# Patient Record
Sex: Male | Born: 1935 | Race: White | Hispanic: No | Smoking: Former smoker
Health system: Southern US, Community
[De-identification: ages and names within clinical notes are randomized; demographics above are authoritative.]

## PROBLEM LIST (undated history)

## (undated) DIAGNOSIS — M1990 Unspecified osteoarthritis, unspecified site: Secondary | ICD-10-CM

## (undated) DIAGNOSIS — I4891 Unspecified atrial fibrillation: Secondary | ICD-10-CM

## (undated) DIAGNOSIS — I272 Pulmonary hypertension, unspecified: Secondary | ICD-10-CM

## (undated) DIAGNOSIS — I10 Essential (primary) hypertension: Secondary | ICD-10-CM

## (undated) DIAGNOSIS — D696 Thrombocytopenia, unspecified: Secondary | ICD-10-CM

## (undated) DIAGNOSIS — N289 Disorder of kidney and ureter, unspecified: Secondary | ICD-10-CM

## (undated) DIAGNOSIS — L989 Disorder of the skin and subcutaneous tissue, unspecified: Secondary | ICD-10-CM

## (undated) DIAGNOSIS — K5792 Diverticulitis of intestine, part unspecified, without perforation or abscess without bleeding: Secondary | ICD-10-CM

## (undated) DIAGNOSIS — D649 Anemia, unspecified: Secondary | ICD-10-CM

## (undated) DIAGNOSIS — Z8719 Personal history of other diseases of the digestive system: Secondary | ICD-10-CM

## (undated) HISTORY — DX: Personal history of other diseases of the digestive system: Z87.19

## (undated) HISTORY — DX: Essential (primary) hypertension: I10

## (undated) HISTORY — DX: Diverticulitis of intestine, part unspecified, without perforation or abscess without bleeding: K57.92

## (undated) HISTORY — DX: Disorder of the skin and subcutaneous tissue, unspecified: L98.9

## (undated) HISTORY — DX: Pulmonary hypertension, unspecified: I27.20

## (undated) HISTORY — DX: Disorder of kidney and ureter, unspecified: N28.9

## (undated) HISTORY — DX: Thrombocytopenia, unspecified: D69.6

## (undated) HISTORY — DX: Anemia, unspecified: D64.9

## (undated) HISTORY — DX: Unspecified atrial fibrillation: I48.91

## (undated) HISTORY — PX: CATARACT EXTRACTION: SUR2

## (undated) HISTORY — PX: TONSILLECTOMY: SUR1361

## (undated) HISTORY — DX: Unspecified osteoarthritis, unspecified site: M19.90

---

## 1978-04-10 HISTORY — PX: OTHER SURGICAL HISTORY: SHX169

## 2000-04-10 HISTORY — PX: AV FISTULA PLACEMENT: SHX1204

## 2007-11-08 ENCOUNTER — Encounter: Payer: Self-pay | Admitting: Internal Medicine

## 2009-06-04 ENCOUNTER — Encounter: Payer: Self-pay | Admitting: Internal Medicine

## 2009-07-06 ENCOUNTER — Encounter: Payer: Self-pay | Admitting: Emergency Medicine

## 2010-02-21 ENCOUNTER — Ambulatory Visit: Payer: Self-pay | Admitting: Internal Medicine

## 2010-02-21 DIAGNOSIS — I959 Hypotension, unspecified: Secondary | ICD-10-CM

## 2010-02-21 DIAGNOSIS — I679 Cerebrovascular disease, unspecified: Secondary | ICD-10-CM | POA: Insufficient documentation

## 2010-02-21 DIAGNOSIS — B0229 Other postherpetic nervous system involvement: Secondary | ICD-10-CM

## 2010-02-21 DIAGNOSIS — D473 Essential (hemorrhagic) thrombocythemia: Secondary | ICD-10-CM

## 2010-02-21 DIAGNOSIS — R55 Syncope and collapse: Secondary | ICD-10-CM | POA: Insufficient documentation

## 2010-02-21 DIAGNOSIS — I4891 Unspecified atrial fibrillation: Secondary | ICD-10-CM | POA: Insufficient documentation

## 2010-02-21 DIAGNOSIS — Z8719 Personal history of other diseases of the digestive system: Secondary | ICD-10-CM

## 2010-02-21 DIAGNOSIS — I272 Pulmonary hypertension, unspecified: Secondary | ICD-10-CM

## 2010-02-21 DIAGNOSIS — N19 Unspecified kidney failure: Secondary | ICD-10-CM | POA: Insufficient documentation

## 2010-02-21 HISTORY — DX: Personal history of other diseases of the digestive system: Z87.19

## 2010-02-21 LAB — CONVERTED CEMR LAB
BUN: 86 mg/dL (ref 6–23)
Basophils Absolute: 0.1 10*3/uL (ref 0.0–0.1)
Basophils Relative: 0.5 % (ref 0.0–3.0)
CO2: 19 meq/L (ref 19–32)
Calcium: 8.5 mg/dL (ref 8.4–10.5)
Chloride: 107 meq/L (ref 96–112)
Creatinine, Ser: 3.8 mg/dL — ABNORMAL HIGH (ref 0.4–1.5)
Eosinophils Absolute: 0.1 10*3/uL (ref 0.0–0.7)
Eosinophils Relative: 1.1 % (ref 0.0–5.0)
GFR calc non Af Amer: 16.22 mL/min (ref >60)
Glucose, Bld: 105 mg/dL — ABNORMAL HIGH (ref 70–99)
HCT: 29.4 % — ABNORMAL LOW (ref 39.0–52.0)
Hemoglobin: 10 g/dL — ABNORMAL LOW (ref 13.0–17.0)
Lymphocytes Relative: 9.2 % — ABNORMAL LOW (ref 12.0–46.0)
Lymphs Abs: 1.1 10*3/uL (ref 0.7–4.0)
MCHC: 34.1 g/dL (ref 30.0–36.0)
MCV: 87.9 fL (ref 78.0–100.0)
Monocytes Absolute: 0.9 10*3/uL (ref 0.1–1.0)
Monocytes Relative: 6.9 % (ref 3.0–12.0)
Neutro Abs: 10.3 10*3/uL — ABNORMAL HIGH (ref 1.4–7.7)
Neutrophils Relative %: 82.3 % — ABNORMAL HIGH (ref 43.0–77.0)
Platelets: 447 10*3/uL — ABNORMAL HIGH (ref 150.0–400.0)
Potassium: 5 meq/L (ref 3.5–5.1)
RBC: 3.34 M/uL — ABNORMAL LOW (ref 4.22–5.81)
RDW: 15.5 % — ABNORMAL HIGH (ref 11.5–14.6)
Sodium: 137 meq/L (ref 135–145)
TSH: 3.26 microintl units/mL (ref 0.35–5.50)
WBC: 12.5 10*3/uL — ABNORMAL HIGH (ref 4.5–10.5)

## 2010-02-28 ENCOUNTER — Ambulatory Visit: Payer: Self-pay | Admitting: Internal Medicine

## 2010-03-01 ENCOUNTER — Telehealth: Payer: Self-pay | Admitting: Internal Medicine

## 2010-03-01 ENCOUNTER — Encounter: Payer: Self-pay | Admitting: Internal Medicine

## 2010-03-02 ENCOUNTER — Encounter: Payer: Self-pay | Admitting: Internal Medicine

## 2010-03-07 DIAGNOSIS — N186 End stage renal disease: Secondary | ICD-10-CM

## 2010-03-08 ENCOUNTER — Encounter: Payer: Self-pay | Admitting: Internal Medicine

## 2010-03-08 ENCOUNTER — Telehealth: Payer: Self-pay | Admitting: Internal Medicine

## 2010-03-08 LAB — COMPREHENSIVE METABOLIC PANEL
Alkaline Phosphatase: 100 U/L (ref 39–117)
BUN: 67 mg/dL — ABNORMAL HIGH (ref 6–23)
CO2: 20 mEq/L (ref 19–32)
Creatinine, Ser: 2.7 mg/dL — ABNORMAL HIGH (ref 0.40–1.50)
Glucose, Bld: 134 mg/dL — ABNORMAL HIGH (ref 70–99)
Total Bilirubin: 0.3 mg/dL (ref 0.3–1.2)

## 2010-03-08 LAB — CBC WITH DIFFERENTIAL/PLATELET
Basophils Absolute: 0.1 10*3/uL (ref 0.0–0.1)
Eosinophils Absolute: 0.2 10*3/uL (ref 0.0–0.5)
HCT: 29.3 % — ABNORMAL LOW (ref 38.4–49.9)
HGB: 9.9 g/dL — ABNORMAL LOW (ref 13.0–17.1)
LYMPH%: 15.9 % (ref 14.0–49.0)
MCV: 88.1 fL (ref 79.3–98.0)
MONO%: 7.2 % (ref 0.0–14.0)
NEUT#: 7.4 10*3/uL — ABNORMAL HIGH (ref 1.5–6.5)
NEUT%: 74.1 % (ref 39.0–75.0)
Platelets: 689 10*3/uL — ABNORMAL HIGH (ref 140–400)
RBC: 3.33 10*6/uL — ABNORMAL LOW (ref 4.20–5.82)

## 2010-03-08 LAB — LACTATE DEHYDROGENASE: LDH: 198 U/L (ref 94–250)

## 2010-03-10 LAB — IRON AND TIBC
%SAT: 18 % — ABNORMAL LOW (ref 20–55)
Iron: 43 ug/dL (ref 42–165)
TIBC: 235 ug/dL (ref 215–435)

## 2010-03-10 LAB — PROTEIN ELECTROPHORESIS, SERUM
Alpha-2-Globulin: 10.4 % (ref 7.1–11.8)
Beta Globulin: 6 % (ref 4.7–7.2)
Total Protein, Serum Electrophoresis: 6.9 g/dL (ref 6.0–8.3)

## 2010-03-11 LAB — JAK-2 V617F

## 2010-03-16 ENCOUNTER — Encounter: Payer: Self-pay | Admitting: Internal Medicine

## 2010-03-18 ENCOUNTER — Ambulatory Visit: Payer: Self-pay | Admitting: Emergency Medicine

## 2010-03-22 ENCOUNTER — Ambulatory Visit: Payer: Self-pay | Admitting: Internal Medicine

## 2010-03-22 DIAGNOSIS — M109 Gout, unspecified: Secondary | ICD-10-CM

## 2010-03-22 LAB — CONVERTED CEMR LAB: Uric Acid, Serum: 9.3 mg/dL — ABNORMAL HIGH (ref 4.0–7.8)

## 2010-03-23 ENCOUNTER — Encounter: Payer: Self-pay | Admitting: Internal Medicine

## 2010-03-23 LAB — COMPREHENSIVE METABOLIC PANEL
Alkaline Phosphatase: 88 U/L (ref 39–117)
Creatinine, Ser: 3.67 mg/dL — ABNORMAL HIGH (ref 0.40–1.50)
Glucose, Bld: 112 mg/dL — ABNORMAL HIGH (ref 70–99)
Sodium: 144 mEq/L (ref 135–145)
Total Bilirubin: 0.3 mg/dL (ref 0.3–1.2)
Total Protein: 6.7 g/dL (ref 6.0–8.3)

## 2010-03-23 LAB — CBC WITH DIFFERENTIAL/PLATELET
BASO%: 0.8 % (ref 0.0–2.0)
Eosinophils Absolute: 0.2 10*3/uL (ref 0.0–0.5)
LYMPH%: 15.7 % (ref 14.0–49.0)
MCHC: 33.5 g/dL (ref 32.0–36.0)
MCV: 87.9 fL (ref 79.3–98.0)
MONO%: 6.4 % (ref 0.0–14.0)
NEUT%: 75.3 % — ABNORMAL HIGH (ref 39.0–75.0)
Platelets: 445 10*3/uL — ABNORMAL HIGH (ref 140–400)
RBC: 3.41 10*6/uL — ABNORMAL LOW (ref 4.20–5.82)

## 2010-04-19 ENCOUNTER — Ambulatory Visit: Payer: Self-pay | Admitting: Internal Medicine

## 2010-04-20 ENCOUNTER — Ambulatory Visit
Admission: RE | Admit: 2010-04-20 | Discharge: 2010-04-20 | Payer: Self-pay | Source: Home / Self Care | Attending: Vascular Surgery | Admitting: Vascular Surgery

## 2010-04-20 ENCOUNTER — Ambulatory Visit: Admit: 2010-04-20 | Payer: Self-pay | Admitting: Vascular Surgery

## 2010-04-21 ENCOUNTER — Encounter: Payer: Self-pay | Admitting: Internal Medicine

## 2010-04-21 LAB — CBC WITH DIFFERENTIAL/PLATELET
BASO%: 0.7 % (ref 0.0–2.0)
Basophils Absolute: 0.1 10*3/uL (ref 0.0–0.1)
EOS%: 1.8 % (ref 0.0–7.0)
Eosinophils Absolute: 0.2 10*3/uL (ref 0.0–0.5)
HCT: 31.2 % — ABNORMAL LOW (ref 38.4–49.9)
HGB: 10.4 g/dL — ABNORMAL LOW (ref 13.0–17.1)
LYMPH%: 13.4 % — ABNORMAL LOW (ref 14.0–49.0)
MCH: 29.5 pg (ref 27.2–33.4)
MCHC: 33.5 g/dL (ref 32.0–36.0)
MCV: 88 fL (ref 79.3–98.0)
MONO#: 0.6 10*3/uL (ref 0.1–0.9)
MONO%: 6 % (ref 0.0–14.0)
NEUT#: 8.3 10*3/uL — ABNORMAL HIGH (ref 1.5–6.5)
NEUT%: 78.1 % — ABNORMAL HIGH (ref 39.0–75.0)
Platelets: 331 10*3/uL (ref 140–400)
RBC: 3.54 10*6/uL — ABNORMAL LOW (ref 4.20–5.82)
RDW: 14.9 % — ABNORMAL HIGH (ref 11.0–14.6)
WBC: 10.7 10*3/uL — ABNORMAL HIGH (ref 4.0–10.3)
lymph#: 1.4 10*3/uL (ref 0.9–3.3)

## 2010-04-21 LAB — LACTATE DEHYDROGENASE: LDH: 178 U/L (ref 94–250)

## 2010-04-26 ENCOUNTER — Encounter: Payer: Self-pay | Admitting: Emergency Medicine

## 2010-04-26 ENCOUNTER — Encounter: Payer: Self-pay | Admitting: Internal Medicine

## 2010-05-10 NOTE — Progress Notes (Signed)
Summary: REFILL REQUEST  Phone Note Refill Request Message from:  Patient on March 01, 2010 4:04 PM  Refills Requested: Medication #1:  DOXAZOSIN MESYLATE 1 MG TABS one by mouth q hs   Notes: [facility].   Dosage confirmed as above?Dosage Confirmed    Initial call taken by: Debbra Riding,  March 01, 2010 4:04 PM    Prescriptions: DOXAZOSIN MESYLATE 1 MG TABS (DOXAZOSIN MESYLATE) one by mouth q hs  #30 x 6   Entered by:   Willy Eddy, LPN   Authorized by:   Stacie Glaze MD   Signed by:   Willy Eddy, LPN on 30/16/0109   Method used:   [facility]ronically to        [id number] (retail)       [address] [place], [place]  [address]       Ph: [phone] or [phone]       Fax: [phone]   RxID:   [id number]

## 2010-05-10 NOTE — Letter (Signed)
Summary: Records from [facility] [place] [facility] 2009 - 2011  Records from [facility] 2009 - 2011   Imported By: Maryln Gottron 03/11/2010 09:51:13  _____________________________________________________________________  External Attachment:    Type:   Image     Comment:   External Document

## 2010-05-10 NOTE — Progress Notes (Signed)
Summary: elevated BP  Phone Note Call from Patient Call back at Work Phone [phone]   Caller: Daughter Call For: Stacie Glaze MD Summary of Call: Pt was at the oncologist today and his BP was 241/104 and after Clonidine, it went down to 196/90.  Advised to call Dr. Lovell Sheehan [phone] [facility] Initial call taken by: Lynann Beaver CMA AAMA,  March 08, 2010 3:21 PM  Follow-up for Phone Call        increased the hydralyzine to 50 three times a day--talked with daughter and nephrologist is going to to monitor a nd give bp meds- they increased carvedilol to 6.25 -2 tabs two times a day.  t Follow-up by: Stacie Glaze MD,  March 08, 2010 4:56 PM    New/Updated Medications: HYDRALAZINE HCL 50 MG TABS (HYDRALAZINE HCL) one by mouth TID HYDRALAZINE HCL 25 MG TABS (HYDRALAZINE HCL) 1 three times a day CARVEDILOL 6.25 MG TABS (CARVEDILOL) 2 two times a day

## 2010-05-10 NOTE — Assessment & Plan Note (Signed)
Summary: PAH   Visit Type:  Initial Consult Copy to:  Eliott Nine Primary Provider/Referring Provider:  Darryll Capers  CC:  Pulmonary hypertension consult.  History of Present Illness: 77 yo man, hx cardiomyopathy (believed non-ischemic), A Fib, thrombocytopenia (Mohamed), renal insuffiency Eliott Nine). Also with PAH likely due to his L sided disease, has been on sildenifil x 2 years (started by cardiology in [place]). No longer on coumadin. No hx known lung disease, although he did formerly smoke (10pk-yrs). Getting his Revatio direct from [employer] with assistance program.   Preventive Screening-Counseling & Management  Alcohol-Tobacco     Smoking Status: quit     Packs/Day: 0.5     Year Started: 1942     Year Quit: 1981     Pack years: 20  Current Medications (verified): 1)  Hydralazine Hcl 25 Mg Tabs (Hydralazine Hcl) .Marland Kitchen.. 1 Three Times A Day 2)  Revatio 20 Mg Tabs (Sildenafil Citrate) .... One By Mouth Tid 3)  Carvedilol 6.25 Mg Tabs (Carvedilol) .... 2 Two Times A Day 4)  Folic Acid 1 Mg Tabs (Folic Acid) .... As Directed 5)  Vitamin C 100 Mg Tabs (Ascorbic Acid) .... As Directed 6)  Nexium 40 Mg Cpdr (Esomeprazole Magnesium) .... One By Mouth Daily 7)  Aspir-Low 81 Mg Tbec (Aspirin) .... One By Mouth Daily 8)  Anagrelide Hcl 0.5 Mg Caps (Anagrelide Hcl) .... 5 Caps By Mouth Daily 9)  Lasix 40 Mg Tabs (Furosemide) .... One By Mouth Daily 10)  Doxazosin Mesylate 2 Mg Tabs (Doxazosin Mesylate) .Marland Kitchen.. 1 By Mouth Daily 11)  Tramadol Hcl 50 Mg Tabs (Tramadol Hcl) .Marland Kitchen.. 1-2 By Mouth Every 4-6 Hours As Needed Pain 12)  Vitamin D3 50000 Unit Caps (Cholecalciferol) .Marland Kitchen.. 1 By Mouth Twice Weekly 13)  Uloric 40 Mg Tabs (Febuxostat) .Marland Kitchen.. 1 By Mouth Daily 14)  Integra Plus  Caps (Fefum-Fepoly-Fa-B Cmp-C-Biot) .Marland Kitchen.. 1 By Mouth Daily  Allergies (verified): 1)  ! * Shellfish  Past History:  Family History: Last updated: 03/18/2010 Family History Hypertension Mother with hx of pacer Family History  MI/Heart Attack---father, brother  Risk Factors: Exercise: no (02/21/2010)  Past Medical History: Pulmonary hypetension Atrial fibrillation Thrombocytopenia; hx BM Bx in [place] Anemia Arthritis Chickenpox Diverticulitis; c/b perf and colectomy Hypertension Renal Insufficiency S Cr 2.8 - 3.6  Past Surgical History: Reviewed history from 02/21/2010 and no changes required. Ruptured colon s/p colectomy Tonsilectomy/Adenoids  Family History: Family History Hypertension Mother with hx of pacer Family History MI/Heart Attack---father, brother  Social History: Retired Scientist, physiological Alcohol use-yes Former Smoker Moved to [place] from [place] 11/2009 Packs/Day:  0.5 Pack years:  20  Review of Systems       Able to do most activities, does have some SOB with inclines. No syncope, no CP, no pre-syncope.   Vital Signs:  Patient profile:   77 year old male Height:      61.5 inches (156.21 cm) Weight:      140 pounds (63.64 kg) BMI:     26.12 O2 Sat:      96 % on Room air Temp:     98.1 degrees F (36.72 degrees C) oral Pulse rate:   80 / minute BP sitting:   160 / 82  (left arm) Cuff size:   regular  Vitals Entered By: Michel Bickers CMA (March 18, 2010 2:40 PM)  O2 Sat at Rest %:  96 O2 Flow:  Room air  Serial Vital Signs/Assessments:  Comments: 4:05 PM Ambulatory Pulse Oximetry  Resting; HR__77___  02 Sat__98% on room air___  Lap1 (185 feet)   HR_92____   02 Sat_98% on room air____ Lap2 (185 feet)   HR_96____   02 Sat__100% on room air___    Lap3 (185 feet)   HR__101___   02 Sat__98% on room air___  _x__Test Completed without Difficulty ___Test Stopped due to:  By: Michel Bickers CMA   CC: Pulmonary hypertension consult Is Patient Diabetic? No Comments Medications reviewed with patient Michel Bickers [facility]  March 18, 2010 2:41 PM   Physical Exam  General:  pleasant elderly man, normal appearance and healthy appearing.   Head:  normocephalic and  atraumatic.   Eyes:  pupils equal and pupils round.   Mouth:  no deformity or lesions Neck:  supple and full ROM.   Chest Wall:  no deformities and no tenderness.   Lungs:  normal respiratory effort and no dullness.   Heart:  regular, sounds like NSR today Abdomen:  not examined Msk:  no joint warmth and no redness over joints.   Extremities:  trace left pedal edema and trace right pedal edema.   Neurologic:  alert & oriented X3 and gait normal.   Skin:  intact without lesions or rashes Psych:  alert and cooperative; normal mood and affect; normal attention span and concentration   Impression & Recommendations:  Problem # 1:  PULMONARY HYPERTENSION (ICD-416.8)  Likely secondary to L sided disease. Will screen for walking hypoxemia, interstitial disease (doubt based on exam) - walking oximetry, no desat - CXR now - continue Revatio and contact [employer] to indicate that we are the ordering physicians.  - off coumadin  Problem # 2:  ATRIAL FIBRILLATION (ICD-427.31)  His updated medication list for this problem includes:    Carvedilol 6.25 Mg Tabs (Carvedilol) .Marland Kitchen... 2 two times a day    Aspir-low 81 Mg Tbec (Aspirin) ..... 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 chemo or immunomod drugs - story unclear to me at this time but sounds like he was left w thrombocytopenia (as well as non-ischemic CM?) - will need to get more records from [place] to clarify - he is being evaluated by Dr Arbutus Ped locally  Problem # 4:  RENAL FAILURE (ICD-586) - Dr Eliott Nine  Problem # 5:  HYPERTENSION (ICD-401.9)  His updated medication list for this problem includes:    Hydralazine Hcl 25 Mg Tabs (Hydralazine hcl) .Marland Kitchen... 1 three times a day    Carvedilol 6.25 Mg Tabs (Carvedilol) .Marland Kitchen... 2 two times a day    Lasix 40 Mg Tabs (Furosemide)  ..... One by mouth daily    Doxazosin Mesylate 2 Mg Tabs (Doxazosin mesylate) .Marland Kitchen... 1 by mouth daily  Medications Added to Medication List This Visit: 1)  Doxazosin Mesylate 2 Mg Tabs (Doxazosin mesylate) .Marland Kitchen.. 1 by mouth daily 2)  Vitamin D3 50000 Unit Caps (Cholecalciferol) .Marland Kitchen.. 1 by mouth twice weekly 3)  Uloric 40 Mg Tabs (Febuxostat) .Marland Kitchen.. 1 by mouth daily 4)  Integra Plus Caps (Fefum-fepoly-fa-b cmp-c-biot) .Marland Kitchen.. 1 by mouth daily  Other Orders: Consultation Level IV (16109) Misc. Referral (Misc. Ref) T-2 View CXR (71020TC)  Patient Instructions: 1)  CXR today 2)  We will continue your Revatio 20mg  three times a day  3)  Walking oximetry today was normal 4)  Follow up with  Dr Delton Coombes in 6 months or as needed

## 2010-05-10 NOTE — Consult Note (Signed)
Summary: [facility]   Imported By: Maryln Gottron 03/16/2010 09:19:49  _____________________________________________________________________  External Attachment:    Type:   Image     Comment:   External Document

## 2010-05-10 NOTE — Assessment & Plan Note (Signed)
Summary: BRAND NEW PT/TO EST/PER DR JENKINS/CJR   Vital Signs:  Patient profile:   77 year old male Height:      61.5 inches Weight:      142 pounds BMI:     26.49 Temp:     98.6 degrees F oral Pulse rate:   82 / minute Pulse rhythm:   regular Resp:     16 per minute BP sitting:   148 / 66  Vitals Entered By: Lynann Beaver CMA AAMA (February 21, 2010 2:52 PM) CC: To be established, Hypertension Management Is Patient Diabetic? No Pain Assessment Patient in pain? no        Visit Type:  to establish Primary Care Provider:  Abram Sander Arellano  CC:  To be established and Hypertension Management.  History of Present Illness: History of bleeding  while on Coumadin Records are coming from other doctors office He was on coumadin for AF by Dr  in [facility] due to bleeding gums He has had AF 8 years? He has a hx of reactive?  thrombocythemia.... and is on medication for this... has seen a hematologist for this.. He has a hx of pulmonary HTN on Revatio He had a cardiologist fro the AF had post herpetic neuralgia and has severe pain failed lyrica and neurontin pain is on the right side, worsened with arm movement Hx of renal failure probable near to dialysis  Hypertension History:      He complains of dyspnea with exertion and peripheral edema, but denies headache, chest pain, palpitations, orthopnea, PND, visual symptoms, neurologic problems, syncope, and side effects from treatment.        Positive major cardiovascular risk factors include male age 57 years old or older and hypertension.  Negative major cardiovascular risk factors include non-tobacco-user status.     Preventive Screening-Counseling & Management  Alcohol-Tobacco     Smoking Status: quit     Year Quit: 1982     Tobacco Counseling: to remain off tobacco products  Caffeine-Diet-Exercise     Does Patient Exercise: no  Problems Prior to Update: 1)  Postherpetic Neuralgia  (ICD-053.19) 2)  Thrombocythemia   (ICD-238.71) 3)  Pulmonary Hypertension  (ICD-416.8) 4)  Cerebrovascular Disease  (ICD-437.9) 5)  Syncope  (ICD-780.2) 6)  Hypertension  (ICD-401.9) 7)  Diverticulitis, Hx of  (ICD-V12.79) 8)  Atrial Fibrillation  (ICD-427.31)  Current Medications (verified): 1)  Hydralazine Hcl 25 Mg Tabs (Hydralazine Hcl) .... One By Mouth Tid 2)  Revatio 20 Mg Tabs (Sildenafil Citrate) .... One By Mouth Tid 3)  Carvedilol 6.25 Mg Tabs (Carvedilol) .... One By Mouth Bid 4)  Folic Acid 1 Mg Tabs (Folic Acid) .... As Directed 5)  Iron 325 (65 Fe) Mg Tabs (Ferrous Sulfate) .... As Directed 6)  Vitamin C 100 Mg Tabs (Ascorbic Acid) .... As Directed 7)  Nexium 40 Mg Cpdr (Esomeprazole Magnesium) .... One By Mouth Daily 8)  Aspir-Low 81 Mg Tbec (Aspirin) .... One By Mouth Daily 9)  Anagrelide Hcl 0.5 Mg Caps (Anagrelide Hcl) .... 5 Caps By Mouth Daily 10)  Lasix 40 Mg Tabs (Furosemide) .... One By Mouth Daily 11)  Probenecid 500 Mg Tabs (Probenecid) .... 2 By Mouth Q Day 12)  Doxazosin Mesylate 1 Mg Tabs (Doxazosin Mesylate) .... One By Mouth Q Hs 13)  Tramadol Hcl 50 Mg Tabs (Tramadol Hcl) .Marland Kitchen.. 1-2 By Mouth Every 4-6 Hours As Needed Pain  Allergies (verified): 1)  ! * Shellfish  Past History:  Family History: Last updated: 02/21/2010 Family  History Hypertension Mother with hx of pacer  Social History: Last updated: 02/21/2010 Retired Widow/Widower Alcohol use-yes Former Smoker  Risk Factors: Exercise: no (02/21/2010)  Risk Factors: Smoking Status: quit (02/21/2010)  Past medical, surgical, family and social histories (including risk factors) reviewed, and no changes noted (except as noted below).  Past Medical History: Pulmonary hypetension Atrial fibrillation Platelet disorder Anemia Arthritis Chickenpox Diverticulitis, hx of Heart Arrhythmia Hypertension  Past Surgical History: Ruptured colon s/p colectomy Tonsilectomy/Adenoids  Family History: Reviewed history and no  changes required. Family History Hypertension Mother with hx of pacer  Social History: Reviewed history and no changes required. Retired Widow/Widower Alcohol use-yes Former Smoker Smoking Status:  quit Does Patient Exercise:  no  Review of Systems       The patient complains of decreased hearing and dyspnea on exertion.  The patient denies anorexia, fever, weight loss, weight gain, vision loss, hoarseness, chest pain, syncope, peripheral edema, prolonged cough, headaches, hemoptysis, abdominal pain, melena, hematochezia, severe indigestion/heartburn, hematuria, incontinence, genital sores, muscle weakness, suspicious skin lesions, transient blindness, difficulty walking, depression, unusual weight change, abnormal bleeding, enlarged lymph nodes, angioedema, breast masses, and testicular masses.    Physical Exam  General:  alert and well-hydrated.   Head:  normocephalic and atraumatic.   Eyes:  pupils equal and pupils round.   Ears:  R ear normal and L ear normal.   Neck:  supple and full ROM.   Chest Wall:  no deformities and no tenderness.   Lungs:  normal respiratory effort and no dullness.   Heart:  irregular rhythm.   Abdomen:  soft and non-tender.   Msk:  no joint warmth and no redness over joints.   Extremities:  trace left pedal edema and trace right pedal edema.   Neurologic:  alert & oriented X3 and gait normal.   Skin:  turgor normal and no rashes.   Psych:  Oriented X3 and not anxious appearing.     Impression & Recommendations:  Problem # 1:  POSTHERPETIC NEURALGIA (ICD-053.19) consider referral for pain block... 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 on medications records from [facility] pending moniter CBC today Orders: Venipuncture (02725) Specimen Handling (36644) Hematology Referral (Hematology) TLB-CBC Platelet - w/Differential  (85025-CBCD)  Problem # 3:  PULMONARY HYPERTENSION (ICD-416.8) do not have echo... on revatio.... he think he had an echo early this year will request ASAP Orders: Pulmonary Referral (Pulmonary)  Problem # 4:  HYPERTENSION (ICD-401.9)  stable His updated medication list for this problem includes:    Hydralazine Hcl 25 Mg Tabs (Hydralazine hcl) ..... One by mouth tid    Carvedilol 6.25 Mg Tabs (Carvedilol) ..... One by mouth bid    Lasix 40 Mg Tabs (Furosemide) ..... One by mouth daily    Doxazosin Mesylate 1 Mg Tabs (Doxazosin mesylate) ..... One by mouth q hs  BP today: 148/66  10 Yr Risk Heart Disease: Not enough information  Orders: Specimen Handling (03474) TLB-BMP (Basic Metabolic Panel-BMET) (80048-METABOL)  Problem # 5:  ATRIAL FIBRILLATION (ICD-427.31)  moniter rate.... rate controlled today cannot tolerate coumadin no valve dz has not been on pradax obtain records and consider pradaxa  His updated medication list for this problem includes:    Carvedilol 6.25 Mg Tabs (Carvedilol) ..... One by mouth bid    Aspir-low 81 Mg Tbec (Aspirin) ..... One by mouth daily    Anagrelide Hcl 0.5  Mg Caps (Anagrelide hcl) .Marland KitchenMarland KitchenMarland KitchenMarland Kitchen 5 caps by mouth daily  Orders: Specimen Handling (16109) TLB-TSH (Thyroid Stimulating Hormone) (84443-TSH)  Problem # 6:  END STAGE RENAL DISEASE (ICD-585.6) referral to renal   Complete Medication List: 1)  Hydralazine Hcl 25 Mg Tabs (Hydralazine hcl) .... One by mouth tid 2)  Revatio 20 Mg Tabs (Sildenafil citrate) .... One by mouth tid 3)  Carvedilol 6.25 Mg Tabs (Carvedilol) .... One by mouth bid 4)  Folic Acid 1 Mg Tabs (Folic acid) .... As directed 5)  Iron 325 (65 Fe) Mg Tabs (Ferrous sulfate) .... As directed 6)  Vitamin C 100 Mg Tabs (Ascorbic acid) .... As directed 7)  Nexium 40 Mg Cpdr (Esomeprazole magnesium) .... One by mouth daily 8)  Aspir-low 81 Mg Tbec (Aspirin) .... One by mouth daily 9)  Anagrelide Hcl 0.5 Mg Caps (Anagrelide  hcl) .... 5 caps by mouth daily 10)  Lasix 40 Mg Tabs (Furosemide) .... One by mouth daily 11)  Probenecid 500 Mg Tabs (Probenecid) .... 2 by mouth q day 12)  Doxazosin Mesylate 1 Mg Tabs (Doxazosin mesylate) .... One by mouth q hs 13)  Tramadol Hcl 50 Mg Tabs (Tramadol hcl) .Marland Kitchen.. 1-2 by mouth every 4-6 hours as needed pain  Other Orders: Nephrology Referral (Nephro)  Hypertension Assessment/Plan:      The patient's hypertensive risk group is category B: At least one risk factor (excluding diabetes) with no target organ damage.  Today's blood pressure is 148/66.  His blood pressure goal is < 140/90.   Patient Instructions: 1)  Please schedule a follow-up appointment in 1 months. Prescriptions: TRAMADOL HCL 50 MG TABS (TRAMADOL HCL) 1-2 by mouth every 4-6 hours as needed pain  #100 x 2   Entered and Authorized by:   Stacie Glaze MD   Signed by:   Stacie Glaze MD on 02/21/2010   Method used:   [id number]ail[facility]       [address] [place], [place]  [address]       Ph: [phone] or [phone]       Fax: [phone]   RxID:   [id number]    Orders Added: 1)  New Patient Level III [03474] 2)  Venipuncture [25956] 3)  Specimen Handling [99000] 4)  Pulmonary Referral [Pulmonary] 5)  Hematology Referral [Hematology] 6)  Nephrology Referral [Nephro] 7)  TLB-CBC Platelet - w/Differential [85025-CBCD] 8)  TLB-BMP (Basic Metabolic Panel-BMET) [80048-METABOL] 9)  TLB-TSH (Thyroid Stimulating Hormone) [38756-EPP]

## 2010-05-10 NOTE — Progress Notes (Signed)
Summary: bonnie please call  Phone Note Call from Patient Call back at [phone]   Caller: Patient Call For: Stacie Glaze MD Summary of Call: Pt daugher leslie has ?concerning dad seeing oncologist for his platelets. Initial call taken by: Heron Sabins,  March 01, 2010 11:26 AM  Follow-up for Phone Call        daughter is aware referral will take a few wks Follow-up by: Heron Sabins,  March 01, 2010 11:29 AM  Additional Follow-up for Phone Call Additional follow up Details #1::        terri please follow up on status of referral Additional Follow-up by: Heron Sabins,  March 01, 2010 11:32 AM    Additional Follow-up for Phone Call Additional follow up Details #2::    Pt scheduled - [facility] is calling pt now to make aware. Follow-up by: Corky Mull,  March 02, 2010 2:51 PM

## 2010-05-12 ENCOUNTER — Ambulatory Visit (HOSPITAL_COMMUNITY): Payer: MEDICARE | Attending: Nephrology

## 2010-05-12 DIAGNOSIS — N189 Chronic kidney disease, unspecified: Secondary | ICD-10-CM | POA: Insufficient documentation

## 2010-05-12 DIAGNOSIS — N039 Chronic nephritic syndrome with unspecified morphologic changes: Secondary | ICD-10-CM | POA: Insufficient documentation

## 2010-05-12 DIAGNOSIS — D631 Anemia in chronic kidney disease: Secondary | ICD-10-CM | POA: Insufficient documentation

## 2010-05-12 NOTE — Letter (Addendum)
Summary: [facility]  [facility]   Imported By: Maryln Gottron 04/07/2010 10:01:44  _____________________________________________________________________  External Attachment:    Type:   Image     Comment:   External Document

## 2010-05-12 NOTE — Assessment & Plan Note (Signed)
Summary: 1 month fup//ccm---PT [facility] // RS   Vital Signs:  Patient profile:   77 year old male Height:      61.5 inches Weight:      140 pounds BMI:     26.12 Temp:     98.2 degrees F oral Pulse rate:   60 / minute Resp:     14 per minute BP sitting:   144 / 78  (left arm)  Vitals Entered By: Willy Eddy, LPN (March 22, 2010 11:11 AM) CC: roa Is Patient Diabetic? No   Primary Care Provider:  Darryll Capers  CC:  roa.  History of Present Illness: follow up for pt who has seen multiple new doctors for his problems including renal, pulmonary, and cardiology  reviewed labs and discussion of dialysis potential He looks remarkable weill given his problem list. We ahd a log discussion wwith his reguarding the placement of his shunt for possible dialysis and  his understanding for this consultatiosn for oulmonary and renal. I have spent greater that 45 min face to face evaluating this patient and over 1/2 of this time was in councilling   Preventive Screening-Counseling & Management  Alcohol-Tobacco     Smoking Status: quit     Packs/Day: 0.5     Year Started: 1942     Year Quit: 1981     Pack years: 20     Tobacco Counseling: to remain off tobacco products  Problems Prior to Update: 1)  Hyperuricemia  (ICD-790.6) 2)  End Stage Renal Disease  (ICD-585.6) 3)  Renal Failure  (ICD-586) 4)  Postherpetic Neuralgia  (ICD-053.19) 5)  Thrombocythemia  (ICD-238.71) 6)  Pulmonary Hypertension  (ICD-416.8) 7)  Cerebrovascular Disease  (ICD-437.9) 8)  Syncope  (ICD-780.2) 9)  Hypertension  (ICD-401.9) 10)  Diverticulitis, Hx of  (ICD-V12.79) 11)  Atrial Fibrillation  (ICD-427.31)  Current Problems (verified): 1)  End Stage Renal Disease  (ICD-585.6) 2)  Renal Failure  (ICD-586) 3)  Postherpetic Neuralgia  (ICD-053.19) 4)  Thrombocythemia  (ICD-238.71) 5)  Pulmonary Hypertension  (ICD-416.8) 6)  Cerebrovascular Disease  (ICD-437.9) 7)  Syncope  (ICD-780.2) 8)  Hypertension   (ICD-401.9) 9)  Diverticulitis, Hx of  (ICD-V12.79) 10)  Atrial Fibrillation  (ICD-427.31)  Medications Prior to Update: 1)  Hydralazine Hcl 25 Mg Tabs (Hydralazine Hcl) .Marland Kitchen.. 1 Three Times A Day 2)  Revatio 20 Mg Tabs (Sildenafil Citrate) .... One By Mouth Tid 3)  Carvedilol 6.25 Mg Tabs (Carvedilol) .... 2 Two Times A Day 4)  Folic Acid 1 Mg Tabs (Folic Acid) .... As Directed 5)  Vitamin C 100 Mg Tabs (Ascorbic Acid) .... As Directed 6)  Nexium 40 Mg Cpdr (Esomeprazole Magnesium) .... One By Mouth Daily 7)  Aspir-Low 81 Mg Tbec (Aspirin) .... One By Mouth Daily 8)  Anagrelide Hcl 0.5 Mg Caps (Anagrelide Hcl) .... 5 Caps By Mouth Daily 9)  Lasix 40 Mg Tabs (Furosemide) .... One By Mouth Daily 10)  Doxazosin Mesylate 2 Mg Tabs (Doxazosin Mesylate) .Marland Kitchen.. 1 By Mouth Daily 11)  Tramadol Hcl 50 Mg Tabs (Tramadol Hcl) .Marland Kitchen.. 1-2 By Mouth Every 4-6 Hours As Needed Pain 12)  Vitamin D3 50000 Unit Caps (Cholecalciferol) .Marland Kitchen.. 1 By Mouth Twice Weekly 13)  Uloric 40 Mg Tabs (Febuxostat) .Marland Kitchen.. 1 By Mouth Daily 14)  Integra Plus  Caps (Fefum-Fepoly-Fa-B Cmp-C-Biot) .Marland Kitchen.. 1 By Mouth Daily  Current Medications (verified): 1)  Hydralazine Hcl 25 Mg Tabs (Hydralazine Hcl) .Marland Kitchen.. 1 Three Times A Day 2)  Revatio 20  Mg Tabs (Sildenafil Citrate) .... One By Mouth Tid 3)  Carvedilol 25 Mg Tabs (Carvedilol) .Marland Kitchen.. 1 Two Times A Day 4)  Folic Acid 1 Mg Tabs (Folic Acid) .... As Directed 5)  Vitamin C 100 Mg Tabs (Ascorbic Acid) .... As Directed 6)  Nexium 40 Mg Cpdr (Esomeprazole Magnesium) .... One By Mouth Daily 7)  Aspir-Low 81 Mg Tbec (Aspirin) .... One By Mouth Daily 8)  Anagrelide Hcl 0.5 Mg Caps (Anagrelide Hcl) .... 5 Caps By Mouth Daily 9)  Lasix 40 Mg Tabs (Furosemide) .... One By Mouth Daily 10)  Doxazosin Mesylate 2 Mg Tabs (Doxazosin Mesylate) .Marland Kitchen.. 1 By Mouth Daily 11)  Tramadol Hcl 50 Mg Tabs (Tramadol Hcl) .Marland Kitchen.. 1-2 By Mouth Every 4-6 Hours As Needed Pain 12)  Vitamin D3 50000 Unit Caps  (Cholecalciferol) .Marland Kitchen.. 1 By Mouth Twice Weekly 13)  Uloric 40 Mg Tabs (Febuxostat) .Marland Kitchen.. 1 By Mouth Daily 14)  Integra Plus  Caps (Fefum-Fepoly-Fa-B Cmp-C-Biot) .Marland Kitchen.. 1 By Mouth Daily  Allergies (verified): 1)  ! * Shellfish  Past History:  Family History: Last updated: 03/18/2010 Family History Hypertension Mother with hx of pacer Family History MI/Heart Attack---father, brother  Social History: Last updated: 03/18/2010 Retired Counsellor Widow/Widower Alcohol use-yes Former Smoker Moved to [place] from [place] 11/2009  Risk Factors: Exercise: no (02/21/2010)  Risk Factors: Smoking Status: quit (03/22/2010) Packs/Day: 0.5 (03/22/2010)  Past medical, surgical, family and social histories (including risk factors) reviewed, and no changes noted (except as noted below).  Past Medical History: Reviewed history from 03/18/2010 and no changes required. Pulmonary hypetension Atrial fibrillation Thrombocytopenia; hx BM Bx in [place] Anemia Arthritis Chickenpox Diverticulitis; c/b perf and colectomy Hypertension Renal Insufficiency S Cr 2.8 - 3.6  Past Surgical History: Reviewed history from 02/21/2010 and no changes required. Ruptured colon s/p colectomy Tonsilectomy/Adenoids  Family History: Reviewed history from 03/18/2010 and no changes required. Family History Hypertension Mother with hx of pacer Family History MI/Heart Attack---father, brother  Social History: Reviewed history from 03/18/2010 and no changes required. Retired Scientist, physiological Alcohol use-yes Former Smoker Moved to [place] from [place] 11/2009  Review of Systems  The patient denies anorexia, fever, weight loss, weight gain, vision loss, decreased hearing, hoarseness, chest pain, syncope, dyspnea on exertion, peripheral edema, prolonged cough, headaches, hemoptysis, abdominal pain, melena, hematochezia, severe indigestion/heartburn, hematuria, incontinence, genital sores, muscle weakness, suspicious skin lesions,  transient blindness, difficulty walking, depression, unusual weight change, abnormal bleeding, enlarged lymph nodes, angioedema, breast masses, and testicular masses.    Physical Exam  General:  alert and well-hydrated.   Head:  normocephalic and atraumatic.   Eyes:  pupils equal and pupils round.   Ears:  R ear normal and L ear normal.   Neck:  supple and full ROM.   Lungs:  normal respiratory effort and no dullness.   Heart:  irregular rhythm.   Msk:  no joint warmth, no redness over joints, and no joint deformities.   Psych:  Oriented X3, memory intact for recent and remote, and normally interactive.     Impression & Recommendations:  Problem # 1:  END STAGE RENAL DISEASE (ICD-585.6) Assessment Unchanged stable but recommend shunt Labs Reviewed: BUN: 86 (02/21/2010)   Cr: 3.8 (02/21/2010)    Hgb: 10.0 (02/21/2010)   Hct: 29.4 (02/21/2010)   Ca++: 8.5 (02/21/2010)     Problem # 2:  POSTHERPETIC NEURALGIA (ICD-053.19) pain control adequate  Problem # 3:  THROMBOCYTHEMIA (ICD-238.71) monitering and referral to hematology complete  Problem # 4:  PULMONARY HYPERTENSION (  ICD-416.8) reviewed referral to pulmonary and agree with plan  Problem # 5:  HYPERTENSION (ICD-401.9) Assessment: Improved  His updated medication list for this problem includes:    Hydralazine Hcl 25 Mg Tabs (Hydralazine hcl) .Marland Kitchen... 1 three times a day    Carvedilol 25 Mg Tabs (Carvedilol) .Marland Kitchen... 1 two times a day    Lasix 40 Mg Tabs (Furosemide) ..... One by mouth daily    Doxazosin Mesylate 2 Mg Tabs (Doxazosin mesylate) .Marland Kitchen... 1 by mouth daily  BP today: 144/78 Prior BP: 160/82 (03/18/2010)  Prior 10 Yr Risk Heart Disease: Not enough information (02/21/2010)  Labs Reviewed: K+: 5.0 (02/21/2010) Creat: : 3.8 (02/21/2010)     Complete Medication List: 1)  Hydralazine Hcl 25 Mg Tabs (Hydralazine hcl) .Marland Kitchen.. 1 three times a day 2)  Revatio 20 Mg Tabs (Sildenafil citrate) .... One by mouth tid 3)   Carvedilol 25 Mg Tabs (Carvedilol) .Marland Kitchen.. 1 two times a day 4)  Folic Acid 1 Mg Tabs (Folic acid) .... As directed 5)  Vitamin C 100 Mg Tabs (Ascorbic acid) .... As directed 6)  Nexium 40 Mg Cpdr (Esomeprazole magnesium) .... One by mouth daily 7)  Aspir-low 81 Mg Tbec (Aspirin) .... One by mouth daily 8)  Anagrelide Hcl 0.5 Mg Caps (Anagrelide hcl) .... 5 caps by mouth daily 9)  Lasix 40 Mg Tabs (Furosemide) .... One by mouth daily 10)  Doxazosin Mesylate 2 Mg Tabs (Doxazosin mesylate) .Marland Kitchen.. 1 by mouth daily 11)  Tramadol Hcl 50 Mg Tabs (Tramadol hcl) .Marland Kitchen.. 1-2 by mouth every 4-6 hours as needed pain 12)  Vitamin D3 50000 Unit Caps (Cholecalciferol) .Marland Kitchen.. 1 by mouth twice weekly 13)  Uloric 40 Mg Tabs (Febuxostat) .Marland Kitchen.. 1 by mouth daily 14)  Integra Plus Caps (Fefum-fepoly-fa-b cmp-c-biot) .Marland Kitchen.. 1 by mouth daily  Other Orders: Specimen Handling (16109) Venipuncture (60454) TLB-Uric Acid, Blood (84550-URIC)  Patient Instructions: 1)  Please schedule a follow-up appointment in 3 months.   Orders Added: 1)  Est. Patient Level IV [09811] 2)  Specimen Handling [99000] 3)  Venipuncture [91478] 4)  TLB-Uric Acid, Blood [84550-URIC]

## 2010-05-12 NOTE — Letter (Signed)
Summary: [facility]  [facility]   Imported By: Maryln Gottron 04/05/2010 09:14:08  _____________________________________________________________________  External Attachment:    Type:   Image     Comment:   External Document

## 2010-05-12 NOTE — Letter (Signed)
Summary: [facility]  [facility]   Imported By: Maryln Gottron 03/22/2010 11:00:07  _____________________________________________________________________  External Attachment:    Type:   Image     Comment:   External Document

## 2010-05-18 NOTE — Letter (Signed)
Summary: [facility]   Imported By: Maryln Gottron 05/09/2010 14:58:21  _____________________________________________________________________  External Attachment:    Type:   Image     Comment:   External Document

## 2010-05-19 ENCOUNTER — Other Ambulatory Visit: Payer: Self-pay | Admitting: Internal Medicine

## 2010-05-19 ENCOUNTER — Encounter (HOSPITAL_BASED_OUTPATIENT_CLINIC_OR_DEPARTMENT_OTHER): Payer: MEDICARE | Admitting: Internal Medicine

## 2010-05-19 DIAGNOSIS — I10 Essential (primary) hypertension: Secondary | ICD-10-CM

## 2010-05-19 DIAGNOSIS — D696 Thrombocytopenia, unspecified: Secondary | ICD-10-CM

## 2010-05-19 DIAGNOSIS — D473 Essential (hemorrhagic) thrombocythemia: Secondary | ICD-10-CM

## 2010-05-19 DIAGNOSIS — N289 Disorder of kidney and ureter, unspecified: Secondary | ICD-10-CM

## 2010-05-19 LAB — LACTATE DEHYDROGENASE: LDH: 161 U/L (ref 94–250)

## 2010-05-19 LAB — CBC WITH DIFFERENTIAL/PLATELET
BASO%: 0.6 % (ref 0.0–2.0)
Basophils Absolute: 0.1 10*3/uL (ref 0.0–0.1)
EOS%: 0.8 % (ref 0.0–7.0)
Eosinophils Absolute: 0.1 10*3/uL (ref 0.0–0.5)
HCT: 31.5 % — ABNORMAL LOW (ref 38.4–49.9)
HGB: 10.3 g/dL — ABNORMAL LOW (ref 13.0–17.1)
LYMPH%: 11.7 % — ABNORMAL LOW (ref 14.0–49.0)
MCH: 28.9 pg (ref 27.2–33.4)
MCHC: 32.8 g/dL (ref 32.0–36.0)
MCV: 88 fL (ref 79.3–98.0)
MONO#: 0.7 10*3/uL (ref 0.1–0.9)
MONO%: 5.7 % (ref 0.0–14.0)
NEUT#: 10 10*3/uL — ABNORMAL HIGH (ref 1.5–6.5)
NEUT%: 81.2 % — ABNORMAL HIGH (ref 39.0–75.0)
Platelets: 466 10*3/uL — ABNORMAL HIGH (ref 140–400)
RBC: 3.58 10*6/uL — ABNORMAL LOW (ref 4.20–5.82)
RDW: 14.9 % — ABNORMAL HIGH (ref 11.0–14.6)
WBC: 12.3 10*3/uL — ABNORMAL HIGH (ref 4.0–10.3)
lymph#: 1.4 10*3/uL (ref 0.9–3.3)

## 2010-05-26 NOTE — Letter (Signed)
Summary: Azucena Fallen MD/[facility]  Azucena Fallen MD/[facility]   Imported By: Lester Latah 05/19/2010 10:45:54  _____________________________________________________________________  External Attachment:    Type:   Image     Comment:   External Document

## 2010-06-07 NOTE — Letter (Signed)
Summary: Clarene Reamer MD/[facility]  Clarene Reamer MD/[facility]   Imported By: Lester Albia 06/02/2010 10:14:15  _____________________________________________________________________  External Attachment:    Type:   Image     Comment:   External Document

## 2010-06-13 ENCOUNTER — Other Ambulatory Visit: Payer: Self-pay | Admitting: Internal Medicine

## 2010-06-16 ENCOUNTER — Other Ambulatory Visit: Payer: Self-pay | Admitting: Internal Medicine

## 2010-06-16 ENCOUNTER — Encounter (HOSPITAL_BASED_OUTPATIENT_CLINIC_OR_DEPARTMENT_OTHER): Payer: MEDICARE | Admitting: Internal Medicine

## 2010-06-16 DIAGNOSIS — N289 Disorder of kidney and ureter, unspecified: Secondary | ICD-10-CM

## 2010-06-16 DIAGNOSIS — D473 Essential (hemorrhagic) thrombocythemia: Secondary | ICD-10-CM

## 2010-06-16 DIAGNOSIS — I10 Essential (primary) hypertension: Secondary | ICD-10-CM

## 2010-06-16 LAB — CBC WITH DIFFERENTIAL/PLATELET
Basophils Absolute: 0 10*3/uL (ref 0.0–0.1)
Eosinophils Absolute: 0.1 10*3/uL (ref 0.0–0.5)
HCT: 30.2 % — ABNORMAL LOW (ref 38.4–49.9)
HGB: 10 g/dL — ABNORMAL LOW (ref 13.0–17.1)
LYMPH%: 14 % (ref 14.0–49.0)
MCV: 87.5 fL (ref 79.3–98.0)
MONO%: 5.9 % (ref 0.0–14.0)
NEUT#: 8.1 10*3/uL — ABNORMAL HIGH (ref 1.5–6.5)
NEUT%: 79 % — ABNORMAL HIGH (ref 39.0–75.0)
Platelets: 312 10*3/uL (ref 140–400)
RBC: 3.45 10*6/uL — ABNORMAL LOW (ref 4.20–5.82)

## 2010-06-20 ENCOUNTER — Ambulatory Visit (INDEPENDENT_AMBULATORY_CARE_PROVIDER_SITE_OTHER): Payer: MEDICARE | Admitting: Internal Medicine

## 2010-06-20 ENCOUNTER — Encounter: Payer: Self-pay | Admitting: Internal Medicine

## 2010-06-20 DIAGNOSIS — B0229 Other postherpetic nervous system involvement: Secondary | ICD-10-CM

## 2010-06-20 DIAGNOSIS — D473 Essential (hemorrhagic) thrombocythemia: Secondary | ICD-10-CM

## 2010-06-20 DIAGNOSIS — I10 Essential (primary) hypertension: Secondary | ICD-10-CM

## 2010-06-20 MED ORDER — CLONIDINE HCL 0.2 MG PO TABS: 0.2000 mg | ORAL_TABLET | Freq: Two times a day (BID) | ORAL | Status: DC

## 2010-06-20 NOTE — Assessment & Plan Note (Signed)
The pt has post herpetic neuralgia for 10 plus years and is requesting a nerve block. The patient's postherpetic neuralgia has lasted for greater than 10 years and he has  Failed neurotin therapy

## 2010-06-20 NOTE — Assessment & Plan Note (Signed)
Stable in  500-600 range

## 2010-06-20 NOTE — Patient Instructions (Signed)
Going to stop the hydralazine  And replace it with Catapres 0.2 mg twice daily if in monitoring her blood pressure at the Catapres drops it the low 130 on the top number then cut the Catapres tablets in 1/2

## 2010-06-20 NOTE — Progress Notes (Signed)
  Subjective:    Patient ID: Ryan Arellano, male    DOB: 24-Jun-1921, 77 y.o.   MRN: [id number]  HPI    Review of Systems     Objective:   Physical Exam        Assessment & Plan:

## 2010-06-24 ENCOUNTER — Other Ambulatory Visit: Payer: Self-pay | Admitting: *Deleted

## 2010-06-24 DIAGNOSIS — B0229 Other postherpetic nervous system involvement: Secondary | ICD-10-CM

## 2010-07-12 ENCOUNTER — Encounter: Payer: Self-pay | Admitting: Internal Medicine

## 2010-07-12 ENCOUNTER — Telehealth: Payer: Self-pay | Admitting: *Deleted

## 2010-07-12 NOTE — Telephone Encounter (Signed)
Notified pt of Clonidine 1 mg bid.

## 2010-07-12 NOTE — Telephone Encounter (Signed)
Pt is calling concerned about his BP.  It is 119/64, and Dr. Lovell Sheehan told him not to let it go under 130.  He uses Clonidine to manipulate it.  What should he do?

## 2010-07-12 NOTE — Telephone Encounter (Signed)
Decrease clonidine to 1/2 pill bid (he has 2mg  tablets so 1/2 pill would be 1 mg bid)

## 2010-07-26 ENCOUNTER — Ambulatory Visit (INDEPENDENT_AMBULATORY_CARE_PROVIDER_SITE_OTHER): Payer: MEDICARE | Admitting: Internal Medicine

## 2010-07-26 ENCOUNTER — Encounter: Payer: Self-pay | Admitting: Internal Medicine

## 2010-07-26 DIAGNOSIS — I2789 Other specified pulmonary heart diseases: Secondary | ICD-10-CM

## 2010-07-26 DIAGNOSIS — D473 Essential (hemorrhagic) thrombocythemia: Secondary | ICD-10-CM

## 2010-07-26 DIAGNOSIS — B0229 Other postherpetic nervous system involvement: Secondary | ICD-10-CM

## 2010-07-26 DIAGNOSIS — I10 Essential (primary) hypertension: Secondary | ICD-10-CM

## 2010-07-26 NOTE — Assessment & Plan Note (Signed)
The patient's pulmonary condition is stable on revatio

## 2010-07-26 NOTE — Assessment & Plan Note (Addendum)
Platelet count stable he is seeing the oncologist

## 2010-07-26 NOTE — Progress Notes (Signed)
  Subjective:    Patient ID: Ryan Arellano, male    DOB: [birth year], 77 y.o.   MRN: [id number]  HPI This is an 77 year old white male with multiple medical problems including history of hypertension pulmonary hypertension chronic atrial fibrillation end-stage renal disease a history of thrombocythemia and history of gastroesophageal reflux.  He presents today for monitoring of his blood pressure after adjusting his blood pressure medicines at the last visit. His blood pressure is stable and his dizziness is markedly improved he seems to be doing well his endurance is stable he denies any new symptoms of chest pain shortness of breath PND orthopnea has no increasing edema in his feet.  He continues to see the oncologist for treatment per thrombocythemia his platelet counts are in the 300 range he has followup next week with blood work with them his appointment with the neurologist for his postherpetic neuralgia   Review of Systems  Constitutional: Negative for fever, fatigue and unexpected weight change.  HENT: Negative.   Eyes: Negative.   Respiratory: Positive for shortness of breath.   Cardiovascular: Positive for palpitations.  Genitourinary: Positive for decreased urine volume.  Neurological: Positive for weakness.  Hematological: Bruises/bleeds easily.   Past Medical History  Diagnosis Date  . Pulmonary hypertension   . Atrial fibrillation   . Thrombocytopenia   . Anemia   . Arthritis   . Diverticulitis   . Hypertension   . Renal insufficiency    Past Surgical History  Procedure Date  . Ruptured colon   . S/p colectomy   . Tonsillectomy     reports that he quit smoking about 4 months ago. He does not have any smokeless tobacco history on file. He reports that he drinks alcohol. His drug history not on file. family history includes Heart disease in his father and Hypertension in his father. No Known Allergies     Objective:   Physical Exam  Constitutional: He  is oriented to person, place, and time. He appears well-nourished. No distress.  HENT:  Head: Normocephalic and atraumatic.  Eyes: EOM are normal. Pupils are equal, round, and reactive to light.  Neck: No JVD present.  Cardiovascular:       Irregular rate and rhythm  Pulmonary/Chest: Effort normal. No stridor. He has no wheezes. He has no rales.  Abdominal: Bowel sounds are normal. He exhibits no distension. There is no tenderness.  Neurological: He is alert and oriented to person, place, and time.  Skin: Skin is warm and dry. He is not diaphoretic.  Psychiatric: His behavior is normal. Thought content normal.          Assessment & Plan:  The patient's blood pressure is stable on her current dose of his medications.  His postherpetic neuralgia has been treated with the use of tramadol he has a referral to a neurologist for consideration of one of the seizure medications such as Neurontin. His atrial fibrillation is stable his end-stage renal disease stable and followed by [facility].

## 2010-07-26 NOTE — Assessment & Plan Note (Signed)
Has a consultation with neurology on the eighth of next month.

## 2010-07-26 NOTE — Assessment & Plan Note (Signed)
Patient's blood pressure is well controlled on a combination of medications we're using

## 2010-07-27 ENCOUNTER — Telehealth: Payer: Self-pay | Admitting: Internal Medicine

## 2010-07-27 ENCOUNTER — Other Ambulatory Visit: Payer: Self-pay | Admitting: Internal Medicine

## 2010-07-27 MED ORDER — ESOMEPRAZOLE MAGNESIUM 40 MG PO CPDR: 40.0000 mg | DELAYED_RELEASE_CAPSULE | Freq: Every day | ORAL | Status: DC

## 2010-07-27 NOTE — Telephone Encounter (Signed)
Applying for rx assistance and will need a new rx for nexium 40mg  1qd. Please call pt call when ready for pick up.

## 2010-07-27 NOTE — Telephone Encounter (Signed)
Pt informed script is ready for pickup. 

## 2010-07-28 ENCOUNTER — Encounter (HOSPITAL_BASED_OUTPATIENT_CLINIC_OR_DEPARTMENT_OTHER): Payer: MEDICARE | Admitting: Internal Medicine

## 2010-07-28 ENCOUNTER — Other Ambulatory Visit: Payer: Self-pay | Admitting: Internal Medicine

## 2010-07-28 ENCOUNTER — Other Ambulatory Visit: Payer: Self-pay | Admitting: *Deleted

## 2010-07-28 DIAGNOSIS — I10 Essential (primary) hypertension: Secondary | ICD-10-CM

## 2010-07-28 DIAGNOSIS — D696 Thrombocytopenia, unspecified: Secondary | ICD-10-CM

## 2010-07-28 DIAGNOSIS — D473 Essential (hemorrhagic) thrombocythemia: Secondary | ICD-10-CM

## 2010-07-28 DIAGNOSIS — N289 Disorder of kidney and ureter, unspecified: Secondary | ICD-10-CM

## 2010-07-28 LAB — CBC WITH DIFFERENTIAL/PLATELET
BASO%: 0.9 % (ref 0.0–2.0)
EOS%: 1.4 % (ref 0.0–7.0)
HCT: 31.1 % — ABNORMAL LOW (ref 38.4–49.9)
LYMPH%: 16.2 % (ref 14.0–49.0)
MCH: 30.2 pg (ref 27.2–33.4)
MCHC: 34.2 g/dL (ref 32.0–36.0)
MONO#: 0.5 10*3/uL (ref 0.1–0.9)
NEUT%: 76.2 % — ABNORMAL HIGH (ref 39.0–75.0)
RBC: 3.53 10*6/uL — ABNORMAL LOW (ref 4.20–5.82)
WBC: 9.5 10*3/uL (ref 4.0–10.3)
lymph#: 1.5 10*3/uL (ref 0.9–3.3)

## 2010-07-28 LAB — LACTATE DEHYDROGENASE: LDH: 171 U/L (ref 94–250)

## 2010-08-11 ENCOUNTER — Ambulatory Visit: Payer: MEDICARE | Admitting: Physical Medicine & Rehabilitation

## 2010-08-16 ENCOUNTER — Ambulatory Visit (HOSPITAL_BASED_OUTPATIENT_CLINIC_OR_DEPARTMENT_OTHER): Payer: MEDICARE | Admitting: Physical Medicine & Rehabilitation

## 2010-08-16 ENCOUNTER — Encounter: Payer: Medicare Other | Attending: Physical Medicine & Rehabilitation

## 2010-08-16 DIAGNOSIS — G894 Chronic pain syndrome: Secondary | ICD-10-CM

## 2010-08-16 DIAGNOSIS — Z7982 Long term (current) use of aspirin: Secondary | ICD-10-CM | POA: Insufficient documentation

## 2010-08-16 DIAGNOSIS — Z79899 Other long term (current) drug therapy: Secondary | ICD-10-CM | POA: Insufficient documentation

## 2010-08-16 DIAGNOSIS — D47Z9 Other specified neoplasms of uncertain behavior of lymphoid, hematopoietic and related tissue: Secondary | ICD-10-CM | POA: Insufficient documentation

## 2010-08-16 DIAGNOSIS — B0229 Other postherpetic nervous system involvement: Secondary | ICD-10-CM | POA: Insufficient documentation

## 2010-08-16 DIAGNOSIS — N186 End stage renal disease: Secondary | ICD-10-CM | POA: Insufficient documentation

## 2010-08-16 DIAGNOSIS — M542 Cervicalgia: Secondary | ICD-10-CM

## 2010-08-16 DIAGNOSIS — M25519 Pain in unspecified shoulder: Secondary | ICD-10-CM | POA: Insufficient documentation

## 2010-08-17 NOTE — Group Therapy Note (Signed)
An 77 year old male with history of end-stage renal disease as well as myeloproliferative disorder, has been treated in past for herpes zoster but despite this has developed posthepatic neuralgia.  His primary complaint is right shoulder pain which he rates as moderate and constant.  His relief from meds is fair.  He has some neck pain.  No pain that is going into the hand but sometimes down toward the elbow. He has had a fair relief with tramadol which he takes 2 tablets at a time, usually once a day but sometimes twice a day.  He lives with his daughter.  She notes no history of falls.  No other gait imbalance.  MEDICATIONS:  Clonidine, folate, furosemide, anagrelide, Revatio presumably for pulmonary hypertension, Nexium, doxazosin, carvedilol, baby aspirin, Uloric, and tramadol.  ALLERGIES:  None known.  He did not bring any pill bottles with him today.  Information from the patient as well as e-chart.  Daughters have also helped with his history.  Medications trialed prior to this time:  He has been tried on gabapentin and Lyrica, both of which he did not tolerate due to sedation.  He is also trialed capsaicin cream which caused excessive burning pain.  He has not tried any tricyclics.  He has not had any injections.  REVIEW OF SYSTEMS:  Negative except as above.  He does have neck stiffness.  SOCIAL HISTORY:  Widowed, lives with his daughter.  He drinks 1 drink less than daily.  PHYSICAL EXAMINATION:  VITAL SIGNS:  Blood pressure 150/80, pulse 59, respirations 18, and O2 saturation 99% on room air. GENERAL:  No acute distress . MUSCULOSKELETAL:  His gait has decreased arm swing, short step length but otherwise no evidence of toe drag or knee instability.  His motor strength is 5/5 bilateral upper and lower extremities and deep tendon reflexes are 2+ bilateral upper and lower extremities.  His sensation is intact in the upper extremities but has some hypersensitivity over  the right deltoid area as well as the right pectoralis area.  His neck has decreased range of motion about 25-50% of normal forward flexion/extension, lateral rotation, and bending.  There is some old scarring in the lateral pectoral region on the right side only.  There is no evidence of muscle wasting.  He has a large lipoma on his posterior scapular area.  IMPRESSION: 1. Postherpetic neuralgia with chronic pain syndrome. 2. Cervicalgia with decreased range of motion, probable spondylosis,     cannot rule out possible superimposed radiculopathy.  RECOMMENDATION: 1. He has had decent relief with tramadol.  For some reason, he is     reluctant to take it b.i.d. but I think this would be appropriate     and it would be the maximum recommended renal dose of tramadol,     i.e. 200 mg per day. 2. We went over other treatment options including other medications.     As a class, he has not tried tricyclics and we will start him on a     low dose i.e. 10 mg q.h.s.  Monitor for signs of low blood     pressure.  Monitor for signs of urinary retention, hangover affect,     dizziness.  Discussed with both the patient and his 2 daughters.     He lives with one of these daughters. 3. I see him back in 1 month after he consistently takes his tramadol     100 mg b.i.d. with his nortriptyline 10 at bedtime.  May  need to     increase nortriptyline if not particularly effective at that time     and if he seems to be tolerating. 4. Other treatment options include bupropion 150 mg daily for week and     then b.i.d. 5. We also does discussed interventional pain procedures.  Given his     multiple medical problems, I would first endeavor to treat him     conservatively.  Treatment options would include epidural steroids,     although intrathecal steroids seem to be more effective based on     literature.  This would be more difficult in the cervical area,     however.  Discussed with the patient and  daughter.     Erick Colace, M.D. Electronically Signed    AEK/MedQ D:  08/16/2010 16:30:59  T:  08/17/2010 04:10:33  Job #:  [id number]  cc:   Stacie Glaze, MD [address] [place] [address]  Duke Salvia Eliott Nine, M.D. Fax: [phone]

## 2010-08-23 NOTE — Consult Note (Signed)
NEW PATIENT CONSULTATION   Ryan Arellano, Ryan Arellano  DOB:  1921/08/26                                       04/20/2010  CHART#:[id number]   I saw this patient in the office today in consultation for hemodialysis  access.  He was referred by Dr. Eliott Nine.  This is a very pleasant 77-year-  old gentleman who is a Warden/ranger at  [employer] who has some chronic  kidney disease.  Of note he is right-handed.  He has not had any recent  uremic symptoms.  Specifically he denies nausea, vomiting, palpitations,  fatigue, anorexia or significant shortness of breath.  His renal  insufficiency was discovered by Dr. Lovell Sheehan who became his primary care  physician morning when the patient moved from [place] and a routine  screening at that time showed a creatinine of 3.8.  It is believed his  chronic kidney disease is secondary to nephrosclerosis.   The patient also has a history of thrombocytopenia in the past which  according to the records was labeled as ITP.  He is followed by Dr.  Lajuana Matte, MD.   PAST MEDICAL HISTORY:  Significant for:  1. Chronic kidney disease.  2. History of anemia.  3. History of hypertension.  4. History of atrial fibrillation in the past.  5. History of pulmonary hypertension.  6. History of bleeding complications on Coumadin.  7. 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   SOCIAL HISTORY:  He is a widow.  He has 3 children.  He is here today  with Lupita Leash and Verlon Au, 2 of his daughters.  He quit tobacco 30 years  ago.   FAMILY HISTORY:  His father died at age 73 from myocardial infarctions.  His mother died at age 29.  He is unaware of any history of premature  cardiovascular disease.   REVIEW OF SYSTEMS:  GENERAL:  He had no recent weight loss, weight gain  or problems with his appetite.  CARDIOVASCULAR:  He does admit to some dyspnea on exertion.  He has had  no chest pain, chest pressure, palpitations.  He had no  history of  stroke, TIAs.  He has had no history of DVT or phlebitis.  GI:  He has a history of reflux.  HEMATOLOGIC:  He has a history of thrombocytopenia and anemia.  MUSCULOSKELETAL:  He does have a history of arthritis.  Neurologic, pulmonary, GU, ENT, psychiatric, integumentary review of  systems is unremarkable as documented on the medical history form in his  chart.   PHYSICAL EXAMINATION:  This is a pleasant 77 year old gentleman who  appears his stated age.  Blood pressure 198/74, heart rate is 64,  respiratory rate is 16.  HEENT:  Unremarkable.  Lungs:  Clear  bilaterally to auscultation without rales, rhonchi or wheezing.  Cardiovascular exam:  I do not detect any carotid bruits.  He has a  regular rate and rhythm.  He has palpable radial pulses bilaterally.  He  has palpable femoral pulses.  He has warm and well-perfused feet without  ischemic ulcers.  He has no significant lower extremity swelling.  Abdomen:  Soft, nontender with normal pitched bowel sounds.  Musculoskeletal exam:  There are no major deformities or cyanosis.  Extremity exam:  Reveals some small forearm cephalic veins bilaterally.  Upper arm cephalic veins  are not identified.  Neurologic exam:  He has  no focal weakness or paresthesias.  Skin:  There are no ulcers or  rashes.   I have independently interpreted his vein mapping today which shows that  the forearm cephalic vein on the right is not visualized.  The upper arm  cephalic vein is very small.  The right basilic vein is very small and  does not appear to be usable for a fistula.  In the left arm, the  forearm cephalic vein and upper arm cephalic vein are quite small.  The  basilic vein likewise appears quite small.  I have also reviewed his  records from Dr. Elias Else office..  He appears that he also has a  history of shingles in the past.  He also has a history of gout.  His  last hemoglobin was 10.4, platelet count of 759,000.   Based on his  vein mapping it appears that he will likely require an AV  graft in the left arm although I have explained that  I will look myself  with a duplex scan and potential explore the basilic vein at the time of  surgery if there is any hopes of a fistula.  However, more likely he  will require an AV graft.  I have discussed the indications for surgery  and the potential complications of surgery including but not limited to  failure of the fistula to mature, graft thrombosis, graft infection,  steal syndrome, bleeding, arm swelling, wound healing problems or other  unpredictable medical problems.  I have explained that he is at  increased risk for bleeding, given his history of platelet problems in  the past and his chronic kidney disease.  The family would like to  discuss his kidney function with Dr. Eliott Nine  before scheduling surgery.  Once he is agreeable to proceed, we will schedule him for placement of a  left AV graft.  If his platelet count is significantly abnormal he may  need to be evaluated by Dr. Lajuana Matte preoperatively.     Di Kindle. Edilia Bo, M.D.  Electronically Signed   CSD/MEDQ  D:  04/20/2010  T:  04/21/2010  Job:  [id number]   cc:   Duke Salvia. Eliott Nine, M.D.

## 2010-08-23 NOTE — Procedures (Signed)
CEPHALIC VEIN MAPPING   INDICATION:  Preoperative vein mapping for AVF placement.   HISTORY:  Chronic kidney disease stage IV.   EXAM:  The right cephalic vein is compressible where imaged.   Diameter measurements range from 0.06 cm to 0.08 cm.   The right basilic vein is compressible.   Diameter measurements range from 0.12 to 0.23 cm.   The left cephalic vein is compressible with diameter measurements  ranging from 0.14 cm to 0.19 cm.   The left basilic vein is compressible with diameter measurements ranging  from 0.15 cm to 0.17 cm.   See attached worksheet for all measurements.   IMPRESSION:  Patent right and left cephalic and basilic veins with  diameter measurements, as described above.   ___________________________________________  Di Kindle. Edilia Bo, M.D.   EM/MEDQ  D:  04/20/2010  T:  04/20/2010  Job:  [id number]

## 2010-08-24 ENCOUNTER — Ambulatory Visit (INDEPENDENT_AMBULATORY_CARE_PROVIDER_SITE_OTHER): Payer: Medicare Other | Admitting: Vascular Surgery

## 2010-08-24 DIAGNOSIS — N186 End stage renal disease: Secondary | ICD-10-CM

## 2010-08-25 NOTE — Assessment & Plan Note (Signed)
OFFICE VISIT  MERCEDES, VALERIANO DOB:  10-03-1921                                       08/24/2010 CHART#:[id number]  I saw patient in the office today to schedule placement of his access in his left arm.  This is a pleasant 77 year old gentleman who I had seen in consultation on April 20, 2010.  At that time, he had undergone vein mapping, and it appeared that he would probably not be a candidate for a fistula, as he had very small veins bilaterally.  We discussed exploring these in the operating room and if necessary placing an AV graft.  However, he was reluctant to proceed with surgery but now was agreeable and comes in today to schedule surgery.  There has been no significant change in his medical history except that his renal function has continued to deteriorate, and he is now agreeable to place access. He has a trip to [place] and would like to schedule this on June 15th. Please refer to my note from January 11 concerning the details of his history and vein mapping.    Di Kindle. Edilia Bo, M.D. Electronically Signed  CSD/MEDQ  D:  08/24/2010  T:  08/25/2010  Job:  [id number]  cc:   Duke Salvia. Eliott Nine, M.D.

## 2010-08-30 ENCOUNTER — Other Ambulatory Visit: Payer: Self-pay | Admitting: Internal Medicine

## 2010-08-30 ENCOUNTER — Encounter: Payer: Self-pay | Admitting: Internal Medicine

## 2010-09-06 ENCOUNTER — Encounter (HOSPITAL_BASED_OUTPATIENT_CLINIC_OR_DEPARTMENT_OTHER): Payer: Medicare Other | Admitting: Internal Medicine

## 2010-09-06 ENCOUNTER — Other Ambulatory Visit: Payer: Self-pay | Admitting: Internal Medicine

## 2010-09-06 DIAGNOSIS — D473 Essential (hemorrhagic) thrombocythemia: Secondary | ICD-10-CM

## 2010-09-06 DIAGNOSIS — N289 Disorder of kidney and ureter, unspecified: Secondary | ICD-10-CM

## 2010-09-06 DIAGNOSIS — I10 Essential (primary) hypertension: Secondary | ICD-10-CM

## 2010-09-06 LAB — CBC WITH DIFFERENTIAL/PLATELET
EOS%: 1.7 % (ref 0.0–7.0)
MCH: 30 pg (ref 27.2–33.4)
MCV: 87.7 fL (ref 79.3–98.0)
MONO%: 6 % (ref 0.0–14.0)
NEUT#: 7.2 10*3/uL — ABNORMAL HIGH (ref 1.5–6.5)
RBC: 3.24 10*6/uL — ABNORMAL LOW (ref 4.20–5.82)
RDW: 15 % — ABNORMAL HIGH (ref 11.0–14.6)
lymph#: 1.4 10*3/uL (ref 0.9–3.3)

## 2010-09-06 LAB — LACTATE DEHYDROGENASE: LDH: 173 U/L (ref 94–250)

## 2010-09-13 ENCOUNTER — Encounter: Payer: Medicare Other | Attending: Physical Medicine & Rehabilitation

## 2010-09-13 ENCOUNTER — Ambulatory Visit (HOSPITAL_BASED_OUTPATIENT_CLINIC_OR_DEPARTMENT_OTHER): Payer: Medicare Other | Admitting: Physical Medicine & Rehabilitation

## 2010-09-13 DIAGNOSIS — Z7982 Long term (current) use of aspirin: Secondary | ICD-10-CM | POA: Insufficient documentation

## 2010-09-13 DIAGNOSIS — M542 Cervicalgia: Secondary | ICD-10-CM | POA: Insufficient documentation

## 2010-09-13 DIAGNOSIS — Z79899 Other long term (current) drug therapy: Secondary | ICD-10-CM | POA: Insufficient documentation

## 2010-09-13 DIAGNOSIS — D47Z9 Other specified neoplasms of uncertain behavior of lymphoid, hematopoietic and related tissue: Secondary | ICD-10-CM | POA: Insufficient documentation

## 2010-09-13 DIAGNOSIS — M25519 Pain in unspecified shoulder: Secondary | ICD-10-CM | POA: Insufficient documentation

## 2010-09-13 DIAGNOSIS — N186 End stage renal disease: Secondary | ICD-10-CM | POA: Insufficient documentation

## 2010-09-13 DIAGNOSIS — B0229 Other postherpetic nervous system involvement: Secondary | ICD-10-CM

## 2010-09-14 NOTE — Assessment & Plan Note (Signed)
REASON FOR VISIT:  Right-sided scapular axillary and chest pain.  An 77 year old male with history of shingles 8 years ago with postherpetic neuralgia.  He has had no significant improvement with Lidoderm patch.  He has chronic kidney disease.  He has been trialed on tramadol 100 mg b.i.d.  This helps him sleep for about 5 hours, but then wears off.  He tried the nortriptyline, but this caused urinary retention and he subsequently discontinued this.  He was seen by me at initial evaluation Aug 16, 2010.  He has had no new medical problems in the interval time.  He continues on his other medications including clonidine, furosemide, doxazosin, carvedilol for his blood pressure, Uloric for gout, Nexium for GERD in addition of his tramadol.  Pain level is around 6/10, interferes with sleep.  He sleeps 5 hours at a time, then wakes up.  He can walk 30 minutes at a time.  He climbs steps.  He drives.  He is retired.  He is independent with all his self- care mobility.  Lives with his daughter.  He is widowed.  Blood pressure 198/84.  He states that his blood pressure was 165/80. Earlier this morning, I did tell him and his daughter that he needs to call his primary physician to get further instructions in regards to his blood pressure.  His pulse is 64, respirations 18, O2 sat 94% on room air.  He has no evidence of dizziness or mental status changes.  His right axilla has mild hypersensitivity to touch.  Skin shows no evidence of rash or scarring.  Some mild tenderness over the right pectoral area as well as right posterior scapular area.  IMPRESSION:  Postherpetic neuralgia.  She gets decent relief with tramadol, but just not long term.  We will start him on tramadol ER 100 mg.  He could take this around suppertime and that will be in his system by the time he goes to bed and should help him sleep throughout the night.  I will see him back in 1 month.  If this is still not effective at  this dosage go up to 200.  Discussed with the patient and daughter, agree with plan.     Erick Colace, M.D. Electronically Signed    AEK/MedQ D:  09/13/2010 14:59:42  T:  09/14/2010 01:49:18  Job #:  [id number]  cc:   Di Kindle. Edilia Bo, M.D. [address] [place] [address]

## 2010-09-22 ENCOUNTER — Other Ambulatory Visit (HOSPITAL_COMMUNITY): Payer: Medicare Other

## 2010-09-23 ENCOUNTER — Ambulatory Visit (HOSPITAL_COMMUNITY)
Admission: RE | Admit: 2010-09-23 | Discharge: 2010-09-23 | Disposition: A | Discharge status: Home / Self Care | Payer: Medicare Other | Source: Ambulatory Visit | Attending: Vascular Surgery | Admitting: Vascular Surgery

## 2010-09-23 DIAGNOSIS — I129 Hypertensive chronic kidney disease with stage 1 through stage 4 chronic kidney disease, or unspecified chronic kidney disease: Secondary | ICD-10-CM | POA: Insufficient documentation

## 2010-09-23 DIAGNOSIS — N186 End stage renal disease: Secondary | ICD-10-CM

## 2010-09-23 DIAGNOSIS — I120 Hypertensive chronic kidney disease with stage 5 chronic kidney disease or end stage renal disease: Secondary | ICD-10-CM

## 2010-09-23 DIAGNOSIS — Z79899 Other long term (current) drug therapy: Secondary | ICD-10-CM | POA: Insufficient documentation

## 2010-09-23 DIAGNOSIS — N184 Chronic kidney disease, stage 4 (severe): Secondary | ICD-10-CM | POA: Insufficient documentation

## 2010-09-23 DIAGNOSIS — Z7982 Long term (current) use of aspirin: Secondary | ICD-10-CM | POA: Insufficient documentation

## 2010-09-23 LAB — SURGICAL PCR SCREEN
MRSA, PCR: NEGATIVE
Staphylococcus aureus: NEGATIVE

## 2010-09-23 LAB — POCT I-STAT 4, (NA,K, GLUC, HGB,HCT): Hemoglobin: 11.2 g/dL — ABNORMAL LOW (ref 13.0–17.0)

## 2010-09-26 ENCOUNTER — Encounter (HOSPITAL_BASED_OUTPATIENT_CLINIC_OR_DEPARTMENT_OTHER): Payer: Medicare Other | Admitting: Internal Medicine

## 2010-09-26 DIAGNOSIS — D473 Essential (hemorrhagic) thrombocythemia: Secondary | ICD-10-CM

## 2010-09-28 NOTE — Op Note (Signed)
  NAMEJAYLUN, Ryan Arellano NO.:  [id number]  MEDICAL RECORD NO.:  [id number]  LOCATION:  [facility]                         FACILITY:  [facility]  PHYSICIAN:  Di Kindle. Edilia Bo, M.D.DATE OF BIRTH:  11/04/1921  DATE OF PROCEDURE:  09/23/2010 DATE OF DISCHARGE:  09/23/2010                              OPERATIVE REPORT   PREOPERATIVE DIAGNOSIS:  Chronic kidney disease stage IV.  POSTOPERATIVE DIAGNOSIS:  Chronic kidney disease stage IV.  PROCEDURE:  Left basilic vein transposition.  SURGEON:  Di Kindle. Edilia Bo, MD  ASSISTANT:  Della Goo, PA-C  ANESTHESIA:  Local with sedation.  TECHNIQUE:  The patient was taken to the operating room and sedated by Anesthesia.  The left upper extremity was prepped and draped in the usual sterile fashion.  After the skin was infiltrated with 1% lidocaine, the basilic vein was harvested from below the antecubital space to the axilla using 4 incisions along the medial aspect of the left arm.  Branches were divided between clips and 3-0 silk ties. Distally, the vein was quite small and not usable.  The vein became larger at about the antecubital level.  It was irrigated up with heparinized saline and the upper part of the vein was felt to be reasonable for a basilic vein transposition.  Through the incision along the medial aspect of the distal upper arm, I was able to expose the brachial artery.  A tunnel was created from this incision to the axillary incision and the vein was tunneled between the 2 incisions. The patient was heparinized.  The brachial artery was clamped proximally and distally and a longitudinal arteriotomy was made.  The vein was sewn end-to-side to the artery using continuous 6-0 Prolene suture.  At the completion, there was an excellent thrill in the fistula and a palpable radial pulse.  Hemostasis was obtained in the wounds.  The wounds were closed with deep layer of 3-0 Vicryl, the skin closed with 4-0  Vicryl. Sterile dressing was applied.  The patient tolerated the procedure well and was transferred to the recovery room in stable condition.  All needle and sponge counts were correct.     Di Kindle. Edilia Bo, M.D.     CSD/MEDQ  D:  09/23/2010  T:  09/24/2010  Job:  [id number]  Electronically Signed by Waverly Ferrari M.D. on 09/28/2010 08:52:46 AM

## 2010-10-03 ENCOUNTER — Telehealth: Payer: Self-pay | Admitting: Emergency Medicine

## 2010-10-03 ENCOUNTER — Other Ambulatory Visit: Payer: Self-pay | Admitting: Internal Medicine

## 2010-10-03 NOTE — Telephone Encounter (Signed)
Form placed in RB look at for review and signature.

## 2010-10-04 NOTE — Telephone Encounter (Signed)
Forms signed.

## 2010-10-04 NOTE — Telephone Encounter (Signed)
Form signed and faxed back to [facility] at [phone].

## 2010-10-10 ENCOUNTER — Encounter: Payer: Medicare Other | Attending: Physical Medicine & Rehabilitation

## 2010-10-10 ENCOUNTER — Ambulatory Visit (HOSPITAL_BASED_OUTPATIENT_CLINIC_OR_DEPARTMENT_OTHER): Payer: Medicare Other | Admitting: Physical Medicine & Rehabilitation

## 2010-10-10 DIAGNOSIS — M25519 Pain in unspecified shoulder: Secondary | ICD-10-CM | POA: Insufficient documentation

## 2010-10-10 DIAGNOSIS — B0229 Other postherpetic nervous system involvement: Secondary | ICD-10-CM | POA: Insufficient documentation

## 2010-10-10 DIAGNOSIS — M542 Cervicalgia: Secondary | ICD-10-CM | POA: Insufficient documentation

## 2010-10-10 DIAGNOSIS — D47Z9 Other specified neoplasms of uncertain behavior of lymphoid, hematopoietic and related tissue: Secondary | ICD-10-CM | POA: Insufficient documentation

## 2010-10-10 DIAGNOSIS — Z79899 Other long term (current) drug therapy: Secondary | ICD-10-CM | POA: Insufficient documentation

## 2010-10-10 DIAGNOSIS — Z7982 Long term (current) use of aspirin: Secondary | ICD-10-CM | POA: Insufficient documentation

## 2010-10-10 DIAGNOSIS — N186 End stage renal disease: Secondary | ICD-10-CM | POA: Insufficient documentation

## 2010-10-10 NOTE — Assessment & Plan Note (Signed)
HISTORY:  An 77 year old male with postherpetic neuralgia right axillary, right parascapular area.  He has had some improvement in his postherpetic neuralgia symptoms with the tramadol ER compared to the IR. However, still his pain is in the 6/10 range.  His pain does worsen with certain activities, described as sharp and burning.  REVIEW OF SYSTEMS:  Also positive shortness of breath but no cough.  INTERVAL MEDICAL HISTORY:  AV fistula placed for his chronic kidney disease on September 23, 2010 in anticipation for renal dialysis.  His blood pressure is 160/48, pulse 60, respirations 16, his O2 sat 94% on room air.  HIS OTHER MEDICATIONS:  Clonidine, folate, furosemide, anagrelide, _________, Nexium, doxazosin, carvedilol, baby aspirin, vitamin C, Uloric, and tramadol as mentioned above.  He does have some hypersensitivity to touch over the right parascapular area as well as right axillary area.  No active skin lesions.  Upper extremity strength is good.  IMPRESSION:  Postherpetic neuralgia.  We will need to increase his tramadol to 200 mg of the extended release daily.  I will see him back in 1 month.  We will consider going up to 300 if current dose is not helpful, but overall is improving.  Discussed with the patient and daughter, agree with plan.     Erick Colace, M.D. Electronically Signed    AEK/MedQ D:  10/10/2010 14:36:11  T:  10/10/2010 17:42:01  Job #:  [id number]  cc:   Stacie Glaze, MD [address] [place] [address]

## 2010-10-25 ENCOUNTER — Ambulatory Visit (INDEPENDENT_AMBULATORY_CARE_PROVIDER_SITE_OTHER): Payer: Medicare Other | Admitting: Internal Medicine

## 2010-10-25 ENCOUNTER — Encounter: Payer: Self-pay | Admitting: Internal Medicine

## 2010-10-25 VITALS — BP 160/62 | HR 80 | Temp 98.2°F | Resp 16 | Ht 61.5 in | Wt 138.0 lb

## 2010-10-25 DIAGNOSIS — N289 Disorder of kidney and ureter, unspecified: Secondary | ICD-10-CM

## 2010-10-25 DIAGNOSIS — I10 Essential (primary) hypertension: Secondary | ICD-10-CM

## 2010-10-25 DIAGNOSIS — I509 Heart failure, unspecified: Secondary | ICD-10-CM

## 2010-10-25 MED ORDER — CLONIDINE HCL 0.2 MG PO TABS: 0.2000 mg | ORAL_TABLET | Freq: Three times a day (TID) | ORAL | Status: DC

## 2010-10-26 ENCOUNTER — Ambulatory Visit (INDEPENDENT_AMBULATORY_CARE_PROVIDER_SITE_OTHER): Payer: Medicare Other | Admitting: Thoracic Diseases

## 2010-10-26 VITALS — BP 171/74 | HR 60

## 2010-10-26 DIAGNOSIS — N186 End stage renal disease: Secondary | ICD-10-CM

## 2010-10-26 NOTE — Progress Notes (Signed)
[facility] [place]  Postoperative Visit hemodialysis access  HPI: Ryan Arellano is a 77 y.o. male who is 6 weeks S/P creation of left upper arm Basilic vein transposition for Hemodialysis access. The patient denies symptoms of numbness, tingling, weakness; denies pain in the operative limb. Patient is here for post -op evaluation to assess healing and maturation of left BVT . Not yet on dialysis . Dr Joaquim Lai follows Pt. For CKD  Physical Examination  Filed Vitals:   10/26/10 1438  BP: 171/74  Pulse: 60   left upper extremity: Incisions are clean, dry, intact, skin color is normal , hand grip is 5/5, sensation in digits is intact;  There is a good thrill and good bruit in the BVT.the fistula is easily palpable and of good size.  Assessment/Plan JHOEL STIEG is a 77 y.o. year old male who presents s/p creation of left BVT Hemodialysis access. Follow-upas needed  The patient's access will be ready for use in 8 weeks.

## 2010-11-03 ENCOUNTER — Other Ambulatory Visit: Payer: Medicare Other

## 2010-11-04 ENCOUNTER — Other Ambulatory Visit (INDEPENDENT_AMBULATORY_CARE_PROVIDER_SITE_OTHER): Payer: Medicare Other

## 2010-11-04 DIAGNOSIS — N289 Disorder of kidney and ureter, unspecified: Secondary | ICD-10-CM

## 2010-11-04 DIAGNOSIS — I10 Essential (primary) hypertension: Secondary | ICD-10-CM

## 2010-11-04 DIAGNOSIS — I509 Heart failure, unspecified: Secondary | ICD-10-CM

## 2010-11-04 LAB — CBC WITH DIFFERENTIAL/PLATELET
Basophils Relative: 0.8 % (ref 0.0–3.0)
Eosinophils Absolute: 0.1 10*3/uL (ref 0.0–0.7)
Hemoglobin: 9.3 g/dL — ABNORMAL LOW (ref 13.0–17.0)
MCHC: 33.2 g/dL (ref 30.0–36.0)
MCV: 87.8 fl (ref 78.0–100.0)
Monocytes Absolute: 0.5 10*3/uL (ref 0.1–1.0)
Neutro Abs: 7.3 10*3/uL (ref 1.4–7.7)
RBC: 3.17 Mil/uL — ABNORMAL LOW (ref 4.22–5.81)

## 2010-11-04 LAB — BASIC METABOLIC PANEL
CO2: 20 mEq/L (ref 19–32)
Chloride: 114 mEq/L — ABNORMAL HIGH (ref 96–112)
Potassium: 4.3 mEq/L (ref 3.5–5.1)
Sodium: 143 mEq/L (ref 135–145)

## 2010-11-07 ENCOUNTER — Telehealth: Payer: Self-pay | Admitting: *Deleted

## 2010-11-07 NOTE — Telephone Encounter (Signed)
Daughter wanted to know how much iron pt should take and freq.  Per Dr Lovell Sheehan take iron 325mg  once daily.  pts daughter aware

## 2010-11-10 ENCOUNTER — Encounter: Payer: Medicare Other | Attending: Physical Medicine & Rehabilitation

## 2010-11-10 ENCOUNTER — Ambulatory Visit (HOSPITAL_BASED_OUTPATIENT_CLINIC_OR_DEPARTMENT_OTHER): Payer: Medicare Other | Admitting: Physical Medicine & Rehabilitation

## 2010-11-10 DIAGNOSIS — Z79899 Other long term (current) drug therapy: Secondary | ICD-10-CM | POA: Insufficient documentation

## 2010-11-10 DIAGNOSIS — M25519 Pain in unspecified shoulder: Secondary | ICD-10-CM | POA: Insufficient documentation

## 2010-11-10 DIAGNOSIS — D47Z9 Other specified neoplasms of uncertain behavior of lymphoid, hematopoietic and related tissue: Secondary | ICD-10-CM | POA: Insufficient documentation

## 2010-11-10 DIAGNOSIS — Z7982 Long term (current) use of aspirin: Secondary | ICD-10-CM | POA: Insufficient documentation

## 2010-11-10 DIAGNOSIS — B0229 Other postherpetic nervous system involvement: Secondary | ICD-10-CM

## 2010-11-10 DIAGNOSIS — N186 End stage renal disease: Secondary | ICD-10-CM | POA: Insufficient documentation

## 2010-11-10 DIAGNOSIS — M542 Cervicalgia: Secondary | ICD-10-CM

## 2010-11-10 DIAGNOSIS — G894 Chronic pain syndrome: Secondary | ICD-10-CM

## 2010-11-10 NOTE — Progress Notes (Signed)
  Subjective:    Patient ID: Ryan Arellano, male    DOB: 1921/09/11, 77 y.o.   MRN: [id number]  HPI The patient is an 77 year old white male who is followed for anemia hypertension chronic kidney disease and a history of postherpetic neuralgia he is followed by oncology for thrombocythemia by her pulmonary division for pulmonary hypertension by our cardiology division for his atrial fibrillation and recently was referred to renal for end-stage renal disease   Review of Systems  Constitutional: Negative for fever and fatigue.  HENT: Negative for hearing loss, congestion, neck pain and postnasal drip.   Eyes: Negative for discharge, redness and visual disturbance.  Respiratory: Negative for cough, shortness of breath and wheezing.   Cardiovascular: Negative for leg swelling.  Gastrointestinal: Negative for abdominal pain, constipation and abdominal distention.  Genitourinary: Negative for urgency and frequency.  Musculoskeletal: Negative for joint swelling and arthralgias.  Skin: Negative for color change and rash.  Neurological: Negative for weakness and light-headedness.  Hematological: Negative for adenopathy.  Psychiatric/Behavioral: Negative for behavioral problems.   Past Medical History  Diagnosis Date  . Pulmonary hypertension   . Atrial fibrillation   . Thrombocytopenia   . Anemia   . Arthritis   . Diverticulitis   . Hypertension   . Renal insufficiency    Past Surgical History  Procedure Date  . Ruptured colon   . S/p colectomy   . Tonsillectomy     reports that he quit smoking about 8 months ago. He does not have any smokeless tobacco history on file. He reports that he drinks alcohol. His drug history not on file. family history includes Heart disease in his father and Hypertension in his father. No Known Allergies     Objective:   Physical Exam  Constitutional: He is oriented to person, place, and time. He appears well-developed and well-nourished. No  distress.  HENT:  Head: Normocephalic and atraumatic.  Eyes: Conjunctivae and EOM are normal. Pupils are equal, round, and reactive to light.  Neck: Normal range of motion. Neck supple. No JVD present.  Cardiovascular: Normal rate and regular rhythm.        Irregular rate and rhythm  Pulmonary/Chest: Effort normal and breath sounds normal. No stridor. He has no wheezes. He has no rales.  Abdominal: Soft. Bowel sounds are normal. He exhibits no distension. There is no tenderness.  Neurological: He is alert and oriented to person, place, and time.  Skin: Skin is warm and dry. He is not diaphoretic.  Psychiatric: His behavior is normal. Thought content normal.          Assessment & Plan:  He has been generally doing well on his current medications of Coreg clonidine Cardura and Lasix.  He is also been taking Revatio for pulmonary hypertension and has been stable. Check a CBC differential for his history of anemia and a basic metabolic panel to monitor his renal functions on the Lasix.  He is stable without any recurrent gout on the uloric

## 2010-11-11 ENCOUNTER — Ambulatory Visit (INDEPENDENT_AMBULATORY_CARE_PROVIDER_SITE_OTHER): Payer: Medicare Other | Admitting: Internal Medicine

## 2010-11-11 DIAGNOSIS — D649 Anemia, unspecified: Secondary | ICD-10-CM

## 2010-11-11 DIAGNOSIS — I10 Essential (primary) hypertension: Secondary | ICD-10-CM

## 2010-11-11 DIAGNOSIS — I150 Renovascular hypertension: Secondary | ICD-10-CM

## 2010-11-11 NOTE — Progress Notes (Signed)
  Subjective:    Patient ID: Ryan Arellano, male    DOB: Mar 24, 1922, 77 y.o.   MRN: [id number]  HPI Presents in the company of his daughter for followup of his atrial fibrillation hypertension and a history of anemia.  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  He is followed by the hematologist oncologist as well as a nephrologist and cardiologist.     Review of Systems Review of systems he is a pleasant elderly male in no apparent distress pale not short of breath denies chest pain shortness of breath PND or orthopnea he has severe arthritic pain    Objective:   Physical Exam BP 140/78  Pulse 76  Temp 98.2 F (36.8 C)  Resp 20  Ht 5\' 5"  (1.651 m)  Wt 138 lb (62.596 kg)  BMI 22.96 kg/m2  On physical examination he is a pale elderly male in no apparent distress HEENT shows nipples are equal round reactive to light and accommodation neck was supple lung fields were clear heart examination revealed a regular rhythm abdomen was soft and examination with 1+ edema      Assessment & Plan:  Patient has a complicated multisystem medical problems hypertension pulmonary hypertension thrombocythemia and end-stage renal disease with anemia.  Notes were reviewed from renal as well as his current creatinine I suspect that he will begin office and we discussed these findings with the patient the patient's daughter

## 2010-11-14 NOTE — Assessment & Plan Note (Signed)
REASON FOR VISIT:  Postherpetic neuralgia right axillary area.  HISTORY:  An 77 year old male with postherpetic neuralgia about 8-year duration right parascapular right axillary area.  Tramadol ER has been successful for his pain.  He is on 200 mg per day.  Pain is in the 6/10 range, but he feels like it is better than it was last month, even though he still reports a same numeric rating scale.  He has had a fistula placed for his chronic kidney disease on September 23, 2010, and anticipation for renal dialysis.  I do not have his figure in terms of his creatinine clearance.  His other medications include clonidine, folate, furosemide, anagrelide, Nexium, doxazosin, carvedilol, baby aspirin, vitamin C, Uloric.  Exam, minimal hypersensitivity to touch in the right axillary area, no active skin lesions, upper extremity strength is good.  His vital signs today are blood pressure 184/54, which is actually better compared to previous pulse 61, respirations 18, sat 95% room air. GENERAL:  No acute stress.  Mood and affect appropriate.  IMPRESSION:  Postherpetic neuralgia.  He has chronic kidney disease. The tramadol precautions include creatinine clearance less than 30. Symptomatically doing fairly well overall.  I am not inclined to bump up his tramadol to 300, even though he would like for me to do so.  We will see if Nephrology can get me a note on his creatinine clearance, I will keep things as it is and monitor closely for signs of side effects such as drowsiness which I have talked to the patient and his daughter about. I will see her back in 3 months.     Erick Colace, M.D. Electronically Signed    AEK/MedQ D:  11/10/2010 11:05:22  T:  11/10/2010 12:29:03  Job #:  [id number]  cc:   Stacie Glaze, MD [address] [place] [address]  Duke Salvia Eliott Nine, M.D. Fax: [phone]

## 2010-11-18 ENCOUNTER — Encounter: Payer: Self-pay | Admitting: Vascular Surgery

## 2010-11-25 ENCOUNTER — Ambulatory Visit: Payer: Medicare Other | Admitting: Internal Medicine

## 2010-11-25 ENCOUNTER — Encounter: Payer: Self-pay | Admitting: Internal Medicine

## 2010-11-25 ENCOUNTER — Ambulatory Visit (HOSPITAL_COMMUNITY)
Admission: RE | Admit: 2010-11-25 | Discharge: 2010-11-25 | Disposition: A | Discharge status: Home / Self Care | Payer: Medicare Other | Source: Ambulatory Visit | Attending: Internal Medicine | Admitting: Internal Medicine

## 2010-11-25 ENCOUNTER — Ambulatory Visit (INDEPENDENT_AMBULATORY_CARE_PROVIDER_SITE_OTHER): Payer: Medicare Other | Admitting: Internal Medicine

## 2010-11-25 VITALS — BP 160/90 | HR 92 | Temp 99.4°F | Resp 20 | Ht 65.0 in | Wt 138.0 lb

## 2010-11-25 DIAGNOSIS — L02619 Cutaneous abscess of unspecified foot: Secondary | ICD-10-CM

## 2010-11-25 DIAGNOSIS — M79609 Pain in unspecified limb: Secondary | ICD-10-CM | POA: Insufficient documentation

## 2010-11-25 DIAGNOSIS — M7989 Other specified soft tissue disorders: Secondary | ICD-10-CM | POA: Insufficient documentation

## 2010-11-25 DIAGNOSIS — M109 Gout, unspecified: Secondary | ICD-10-CM

## 2010-11-25 MED ORDER — CEPHALEXIN 500 MG PO CAPS: 500.0000 mg | ORAL_CAPSULE | Freq: Four times a day (QID) | ORAL | Status: AC

## 2010-11-25 MED ORDER — COLCHICINE 0.6 MG PO TABS: 0.6000 mg | ORAL_TABLET | Freq: Every day | ORAL | Status: DC

## 2010-11-25 NOTE — Progress Notes (Signed)
  Subjective:    Patient ID: Ryan Arellano, male    DOB: Aug 13, 1921, 77 y.o.   MRN: [id number]  HPI patient is an 77 year-old [place] male with chronic renal insufficiency history of thrombocythemia history of hypertension and pulmonary hypertension who presents with acute swelling of his left lower extremity.  His family states that it's been swollen for 2 days there was a little worse yesterday than today and it is tender to touch and there is a distinct purplish bruise over the distal saphenous vein.  He has a history of gout in the foot is warm to touch has a history of anemia with thrombocytopenia and has been followed by renal and is scheduled for  Epogen injection next week.    Review of Systems  Constitutional: Negative for fever and fatigue.  HENT: Negative for hearing loss, congestion, neck pain and postnasal drip.   Eyes: Negative for discharge, redness and visual disturbance.  Respiratory: Negative for cough, shortness of breath and wheezing.   Cardiovascular: Positive for leg swelling.  Gastrointestinal: Negative for abdominal pain, constipation and abdominal distention.  Genitourinary: Negative for urgency and frequency.  Musculoskeletal: Negative for joint swelling and arthralgias.  Skin: Negative for color change and rash.  Neurological: Negative for weakness and light-headedness.  Hematological: Negative for adenopathy.  Psychiatric/Behavioral: Negative for behavioral problems.       Objective:   Physical Exam  Pulse 92  Temp 99.4 F (37.4 C)  Resp 20  Ht 5\' 5"  (1.651 m)  Wt 138 lb (62.596 kg)  BMI 22.96 kg/m2  Blood pressure was 160/80. On physical examination he is sallow appearing elderly male in no apparent distress he has a low-grade temperature of 99.4. His pupils are equal round reactive to light and accommodation his neck was supple his lung fields were essentially clear to auscultation and percussion his heart examination revealed a regular rate and  rhythm 70 HEENT examination revealed marked tenderness and edema of the left lower extremity from the midcalf down there is a bruise over the lower saphenous vein with soft tissue induration there is increased warmth over the ankle.    Plan: Of concern is that this is a deep venous thrombosis and if he has a deep venous thrombosis he would be at risk for PE. Due to his tachycardia and swelling we will send him immediately for a venous Doppler of his lower extremity and would aggressively treat this with Lovenox anticoagulation despite his history of thrombocythemia.  This however should be initiated in a hospital setting and if the Doppler is positive I would admit him to the hospital. Otherwise we will cover for cellulitis and gout with gout being the most prominent secondary diagnosis

## 2010-11-25 NOTE — Progress Notes (Signed)
  Subjective:    Patient ID: Ryan Arellano, male    DOB: [birth year], 77 y.o.   MRN: [id number]  HPI    Review of Systems     Objective:   Physical Exam        Assessment & Plan:  night on-call Nurse has been notified if the Doppler is positive to send him to the emergency room for admission

## 2010-12-01 ENCOUNTER — Ambulatory Visit (HOSPITAL_COMMUNITY)
Admission: RE | Admit: 2010-12-01 | Discharge: 2010-12-01 | Disposition: A | Discharge status: Home / Self Care | Payer: Medicare Other | Source: Ambulatory Visit | Attending: Nephrology | Admitting: Nephrology

## 2010-12-01 ENCOUNTER — Other Ambulatory Visit: Payer: Self-pay | Admitting: Nephrology

## 2010-12-01 DIAGNOSIS — N184 Chronic kidney disease, stage 4 (severe): Secondary | ICD-10-CM | POA: Insufficient documentation

## 2010-12-01 DIAGNOSIS — D638 Anemia in other chronic diseases classified elsewhere: Secondary | ICD-10-CM | POA: Insufficient documentation

## 2010-12-05 ENCOUNTER — Telehealth: Payer: Self-pay | Admitting: *Deleted

## 2010-12-05 NOTE — Telephone Encounter (Signed)
Wife is calling for U/S results.

## 2010-12-05 NOTE — Telephone Encounter (Signed)
Pt informed

## 2010-12-06 ENCOUNTER — Telehealth: Payer: Self-pay | Admitting: *Deleted

## 2010-12-06 NOTE — Telephone Encounter (Signed)
Per dr Lovell Sheehan- may stop

## 2010-12-06 NOTE — Telephone Encounter (Signed)
Pt informed

## 2010-12-06 NOTE — Telephone Encounter (Signed)
Daughter Ryan Arellano) called to ask Dr. Lovell Sheehan if pt can go off Colchicine as his swelling and pain is better.

## 2010-12-13 ENCOUNTER — Encounter: Payer: Self-pay | Admitting: Internal Medicine

## 2010-12-20 ENCOUNTER — Encounter: Payer: Self-pay | Admitting: Internal Medicine

## 2010-12-20 ENCOUNTER — Ambulatory Visit (INDEPENDENT_AMBULATORY_CARE_PROVIDER_SITE_OTHER): Payer: Medicare Other | Admitting: Internal Medicine

## 2010-12-20 DIAGNOSIS — J158 Pneumonia due to other specified bacteria: Secondary | ICD-10-CM

## 2010-12-20 NOTE — Progress Notes (Signed)
  Subjective:    Patient ID: Ryan Arellano, male    DOB: 1922/03/01, 77 y.o.   MRN: [id number]  HPI Patient is a complex 77 year old white male with a history of thrombocythemia pulmonary hypertension atrial fibrillation cerebrovascular disease and end-stage renal disease who presents with Cough for one week, no fever, runny nose and PNDrip, coughing up yellow phlem. Mild weakness. NO SOB or distress..   Review of Systems  Constitutional: Positive for fever and fatigue.  HENT: Positive for congestion and rhinorrhea.   Eyes: Negative for pain, discharge and itching.  Respiratory: Positive for cough and shortness of breath. Negative for wheezing.   Cardiovascular: Positive for palpitations and leg swelling.  Musculoskeletal: Negative.   Skin: Negative.   Neurological: Positive for weakness.  Hematological: Negative.   Psychiatric/Behavioral: Negative.    Past Medical History  Diagnosis Date  . Pulmonary hypertension   . Atrial fibrillation   . Thrombocytopenia   . Anemia   . Arthritis   . Diverticulitis   . Hypertension   . Renal insufficiency    Past Surgical History  Procedure Date  . Ruptured colon   . S/p colectomy   . Tonsillectomy     reports that he quit smoking about 10 months ago. He does not have any smokeless tobacco history on file. He reports that he does not drink alcohol or use illicit drugs. family history includes Heart disease in his father and Hypertension in his father. No Known Allergies     Objective:   Physical Exam  Nursing note and vitals reviewed. Constitutional:       Frail-appearing elderly white male  HENT:  Head: Normocephalic and atraumatic.  Eyes: Conjunctivae and EOM are normal.  Neck: Normal range of motion. Neck supple.  Cardiovascular:       Irregular rate and rhythm  Pulmonary/Chest: He has wheezes.       Increased upper respiratory tract sounds  Abdominal: He exhibits no distension. There is no tenderness.    Musculoskeletal: He exhibits edema.  Neurological: He is alert.  Psychiatric: His behavior is normal. Judgment and thought content normal.          Assessment & Plan:  Upper respiratory tract infection in a patient who has had regular risk from lower respiratory tract infections.  We will treat for possible pneumonia with Avelox 400 mg by mouth daily for 10 days.  We have recommended that he use Robitussin fast Max cough and cold liquid Discussed close monitoring with his daughter should he develop increasing respiratory distress she'll take him to the hospital she'll contact our office to update [place] on his progress in the next 24-40 hours

## 2010-12-29 ENCOUNTER — Other Ambulatory Visit: Payer: Self-pay | Admitting: Nephrology

## 2010-12-29 ENCOUNTER — Encounter (HOSPITAL_COMMUNITY): Payer: Medicare Other | Attending: Nephrology

## 2010-12-29 DIAGNOSIS — N184 Chronic kidney disease, stage 4 (severe): Secondary | ICD-10-CM | POA: Insufficient documentation

## 2010-12-29 DIAGNOSIS — D638 Anemia in other chronic diseases classified elsewhere: Secondary | ICD-10-CM | POA: Insufficient documentation

## 2010-12-30 LAB — IRON AND TIBC
Iron: 33 ug/dL — ABNORMAL LOW (ref 42–135)
Saturation Ratios: 14 % — ABNORMAL LOW (ref 20–55)
UIBC: 207 ug/dL (ref 125–400)

## 2010-12-30 LAB — FERRITIN: Ferritin: 506 ng/mL — ABNORMAL HIGH (ref 22–322)

## 2011-01-02 ENCOUNTER — Encounter (HOSPITAL_BASED_OUTPATIENT_CLINIC_OR_DEPARTMENT_OTHER): Payer: Medicare Other | Admitting: Internal Medicine

## 2011-01-02 ENCOUNTER — Other Ambulatory Visit: Payer: Self-pay | Admitting: Internal Medicine

## 2011-01-02 DIAGNOSIS — I10 Essential (primary) hypertension: Secondary | ICD-10-CM

## 2011-01-02 DIAGNOSIS — D473 Essential (hemorrhagic) thrombocythemia: Secondary | ICD-10-CM

## 2011-01-02 DIAGNOSIS — N289 Disorder of kidney and ureter, unspecified: Secondary | ICD-10-CM

## 2011-01-02 LAB — CBC WITH DIFFERENTIAL/PLATELET
BASO%: 1.1 % (ref 0.0–2.0)
Basophils Absolute: 0.1 10*3/uL (ref 0.0–0.1)
EOS%: 1.4 % (ref 0.0–7.0)
HGB: 9.7 g/dL — ABNORMAL LOW (ref 13.0–17.1)
MCH: 28.2 pg (ref 27.2–33.4)
NEUT#: 6.2 10*3/uL (ref 1.5–6.5)
RBC: 3.44 10*6/uL — ABNORMAL LOW (ref 4.20–5.82)
RDW: 15.9 % — ABNORMAL HIGH (ref 11.0–14.6)
lymph#: 1.4 10*3/uL (ref 0.9–3.3)

## 2011-01-02 LAB — LACTATE DEHYDROGENASE: LDH: 197 U/L (ref 94–250)

## 2011-01-08 ENCOUNTER — Encounter: Payer: Self-pay | Admitting: Internal Medicine

## 2011-01-12 ENCOUNTER — Encounter (HOSPITAL_COMMUNITY)
Admission: RE | Admit: 2011-01-12 | Discharge: 2011-01-12 | Disposition: A | Discharge status: Home / Self Care | Payer: Medicare Other | Source: Ambulatory Visit | Attending: Nephrology | Admitting: Nephrology

## 2011-01-12 DIAGNOSIS — D638 Anemia in other chronic diseases classified elsewhere: Secondary | ICD-10-CM | POA: Insufficient documentation

## 2011-01-12 DIAGNOSIS — N184 Chronic kidney disease, stage 4 (severe): Secondary | ICD-10-CM | POA: Insufficient documentation

## 2011-01-16 ENCOUNTER — Encounter: Payer: Self-pay | Admitting: Internal Medicine

## 2011-01-16 ENCOUNTER — Ambulatory Visit (INDEPENDENT_AMBULATORY_CARE_PROVIDER_SITE_OTHER): Payer: Medicare Other | Admitting: Internal Medicine

## 2011-01-16 VITALS — BP 146/80 | HR 76 | Temp 98.2°F | Resp 20 | Ht 65.0 in | Wt 142.0 lb

## 2011-01-16 DIAGNOSIS — Z23 Encounter for immunization: Secondary | ICD-10-CM

## 2011-01-16 DIAGNOSIS — D649 Anemia, unspecified: Secondary | ICD-10-CM

## 2011-01-16 DIAGNOSIS — I10 Essential (primary) hypertension: Secondary | ICD-10-CM

## 2011-01-16 DIAGNOSIS — N19 Unspecified kidney failure: Secondary | ICD-10-CM

## 2011-01-16 NOTE — Patient Instructions (Signed)
Patient was instructed to continue all medications as prescribed. To stop at the checkout desk and schedule a followup appointment  

## 2011-01-16 NOTE — Progress Notes (Signed)
Subjective:    Patient ID: Ryan Arellano, male    DOB: Jan 29, 1922, 77 y.o.   MRN: [id number]  HPI On procrit for anemia, seeing the cancer center for this Blood pressure stable here but at hospital was 180/90 Seeing the renal center  next week Getting iron infusions  And procrit monthly Breathing good, no increased edema of the lower extremities nor signs of PND orthopnea.  He has multiple medical problems including his thrombocythemia (hypertension atrial fibrillation and end-stage renal disease.   Review of Systems  Constitutional: Negative for fever and fatigue.  HENT: Negative for hearing loss, congestion, neck pain and postnasal drip.   Eyes: Negative for discharge, redness and visual disturbance.  Respiratory: Negative for cough, shortness of breath and wheezing.   Cardiovascular: Negative for leg swelling.  Gastrointestinal: Negative for abdominal pain, constipation and abdominal distention.  Genitourinary: Negative for urgency and frequency.  Musculoskeletal: Negative for joint swelling and arthralgias.  Skin: Negative for color change and rash.  Neurological: Negative for weakness and light-headedness.  Hematological: Negative for adenopathy.  Psychiatric/Behavioral: Negative for behavioral problems.   Past Medical History  Diagnosis Date  . Pulmonary hypertension   . Campath-induced atrial fibrillation   . Thrombocytopenia   . Anemia   . Arthritis   . Diverticulitis   . Hypertension   . Renal insufficiency     History   Social History  . Marital Status: Widowed    Spouse Name: N/A    Number of Children: N/A  . Years of Education: N/A   Occupational History  . retired    Social History Main Topics  . Smoking status: Former Smoker    Quit date: 03/10/2010  . Smokeless tobacco: Not on file  . Alcohol Use: No  . Drug Use: No  . Sexually Active: No   Other Topics Concern  . Not on file   Social History Narrative  . No narrative on file    Past  Surgical History  Procedure Date  . Ruptured colon   . S/p colectomy   . Tonsillectomy     Family History  Problem Relation Age of Onset  . Heart disease Father   . Hypertension Father     No Known Allergies  Current Outpatient Prescriptions on File Prior to Visit  Medication Sig Dispense Refill  . aspirin 81 MG tablet Take 81 mg by mouth daily.        . carvedilol (COREG) 25 MG tablet Take 25 mg by mouth. 1.5 twice a day      . cloNIDine (CATAPRES) 0.2 MG tablet Take 1 tablet (0.2 mg total) by mouth 3 (three) times daily.  90 tablet  11  . colchicine 0.6 MG tablet Take 1 tablet (0.6 mg total) by mouth daily.  30 tablet  0  . doxazosin (CARDURA) 1 MG tablet TAKE 1 TABLET BY MOUTH AT BEDTIME  30 tablet  6  . doxazosin (CARDURA) 2 MG tablet Take 2 mg by mouth at bedtime.        Marland Kitchen esomeprazole (NEXIUM) 40 MG capsule Take 1 capsule (40 mg total) by mouth daily before breakfast.  90 capsule  3  . febuxostat (ULORIC) 40 MG tablet Take 40 mg by mouth daily.       . ferrous sulfate 325 (65 FE) MG tablet Take 325 mg by mouth daily with breakfast.        . folic acid (FOLVITE) 1 MG tablet TAKE 1 TABLET TWICE A DAY  180  tablet  2  . furosemide (LASIX) 40 MG tablet Take 40 mg by mouth daily.        . sildenafil (REVATIO) 20 MG tablet Take 20 mg by mouth 3 (three) times daily.        . traMADol (ULTRAM-ER) 200 MG 24 hr tablet Take 200 mg by mouth daily as needed.        Marland Kitchen DISCONTD: cloNIDine (CATAPRES) 0.2 MG tablet Take 1 tablet (0.2 mg total) by mouth 2 (two) times daily.  60 tablet  11    BP 146/80  Pulse 76  Temp 98.2 F (36.8 C)  Resp 20  Ht 5\' 5"  (1.651 m)  Wt 142 lb (64.411 kg)  BMI 23.63 kg/m2       Objective:   Physical Exam  Nursing note and vitals reviewed. Constitutional: He appears well-developed and well-nourished.  HENT:  Head: Normocephalic and atraumatic.  Eyes: Conjunctivae are normal. Pupils are equal, round, and reactive to light.  Neck: Normal range of  motion. Neck supple.  Cardiovascular: Normal rate and regular rhythm.   Pulmonary/Chest: Effort normal and breath sounds normal.  Abdominal: Soft. Bowel sounds are normal.          Assessment & Plan:  Monitoring anemia and thrombocythemia while on Procrit injections.  Patient has atrial fibrillation and there will be a need to balance hypercoagulability with Procrit and the anticoagulation for the atrial fibrillation.  He has progressive end-stage renal disease and is rapidly approaching dialysis is indicated given all of his multiple medical problems I had a discussion with patient and patient's family about the appropriateness of dialysis.  His blood pressure has been very labile and lability may be related to the Procrit injection today his blood pressure is stable and he denies any chest pain or signs of congestive heart failure

## 2011-01-26 ENCOUNTER — Other Ambulatory Visit: Payer: Self-pay | Admitting: Nephrology

## 2011-01-26 ENCOUNTER — Encounter (HOSPITAL_COMMUNITY): Payer: Medicare Other

## 2011-01-26 LAB — IRON AND TIBC
Iron: 29 ug/dL — ABNORMAL LOW (ref 42–135)
UIBC: 185 ug/dL (ref 125–400)

## 2011-01-26 LAB — POCT HEMOGLOBIN-HEMACUE: Hemoglobin: 8.7 g/dL — ABNORMAL LOW (ref 13.0–17.0)

## 2011-02-08 ENCOUNTER — Other Ambulatory Visit (HOSPITAL_COMMUNITY): Payer: Self-pay | Admitting: *Deleted

## 2011-02-10 ENCOUNTER — Ambulatory Visit: Payer: Medicare Other | Admitting: Physical Medicine & Rehabilitation

## 2011-02-16 ENCOUNTER — Encounter (HOSPITAL_COMMUNITY)
Admission: RE | Admit: 2011-02-16 | Discharge: 2011-02-16 | Disposition: A | Discharge status: Home / Self Care | Payer: Medicare Other | Source: Ambulatory Visit | Attending: Nephrology | Admitting: Nephrology

## 2011-02-16 DIAGNOSIS — N184 Chronic kidney disease, stage 4 (severe): Secondary | ICD-10-CM | POA: Insufficient documentation

## 2011-02-16 DIAGNOSIS — D638 Anemia in other chronic diseases classified elsewhere: Secondary | ICD-10-CM | POA: Insufficient documentation

## 2011-02-16 MED ORDER — FERUMOXYTOL INJECTION 510 MG/17 ML
510.0000 mg | INTRAVENOUS | Status: DC
  Administered 2011-02-16: 510 mg via INTRAVENOUS

## 2011-02-22 ENCOUNTER — Ambulatory Visit (INDEPENDENT_AMBULATORY_CARE_PROVIDER_SITE_OTHER): Payer: Medicare Other | Admitting: Internal Medicine

## 2011-02-22 VITALS — BP 144/80 | HR 72 | Temp 98.2°F | Resp 18 | Ht 65.0 in | Wt 140.0 lb

## 2011-02-22 DIAGNOSIS — L97909 Non-pressure chronic ulcer of unspecified part of unspecified lower leg with unspecified severity: Secondary | ICD-10-CM

## 2011-02-22 DIAGNOSIS — N186 End stage renal disease: Secondary | ICD-10-CM

## 2011-02-22 DIAGNOSIS — I83009 Varicose veins of unspecified lower extremity with ulcer of unspecified site: Secondary | ICD-10-CM

## 2011-02-22 DIAGNOSIS — I10 Essential (primary) hypertension: Secondary | ICD-10-CM

## 2011-02-22 MED ORDER — MUPIROCIN CALCIUM 2 % EX CREA: TOPICAL_CREAM | Freq: Three times a day (TID) | CUTANEOUS | Status: AC

## 2011-02-22 NOTE — Patient Instructions (Signed)
The antibiotic cream on the spot 3 times a day for at least a week until it heals

## 2011-02-23 ENCOUNTER — Telehealth: Payer: Self-pay | Admitting: *Deleted

## 2011-02-23 ENCOUNTER — Encounter: Payer: Self-pay | Admitting: Internal Medicine

## 2011-02-23 ENCOUNTER — Encounter (HOSPITAL_COMMUNITY)
Admission: RE | Admit: 2011-02-23 | Discharge: 2011-02-23 | Disposition: A | Discharge status: Home / Self Care | Payer: Medicare Other | Source: Ambulatory Visit | Attending: Nephrology | Admitting: Nephrology

## 2011-02-23 ENCOUNTER — Encounter (HOSPITAL_COMMUNITY): Payer: Medicare Other

## 2011-02-23 LAB — IRON AND TIBC
Saturation Ratios: 12 % — ABNORMAL LOW (ref 20–55)
TIBC: 207 ug/dL — ABNORMAL LOW (ref 215–435)
UIBC: 182 ug/dL (ref 125–400)

## 2011-02-23 LAB — POCT HEMOGLOBIN-HEMACUE: Hemoglobin: 9.8 g/dL — ABNORMAL LOW (ref 13.0–17.0)

## 2011-02-23 MED ORDER — EPOETIN ALFA 10000 UNIT/ML IJ SOLN
10000.0000 [IU] | INTRAMUSCULAR | Status: DC
  Administered 2011-02-23: 10000 [IU] via SUBCUTANEOUS

## 2011-02-23 MED ORDER — EPOETIN ALFA 10000 UNIT/ML IJ SOLN
INTRAMUSCULAR | Status: AC
  Filled 2011-02-23: qty 1

## 2011-02-23 MED ORDER — FERUMOXYTOL INJECTION 510 MG/17 ML
510.0000 mg | INTRAVENOUS | Status: DC
Start: 2011-02-23 — End: 2011-02-23

## 2011-02-23 MED ORDER — CLONIDINE HCL 0.1 MG PO TABS: 0.1000 mg | ORAL_TABLET | Freq: Once | ORAL | Status: DC | PRN

## 2011-02-23 MED ORDER — FERUMOXYTOL INJECTION 510 MG/17 ML
INTRAVENOUS | Status: AC
  Administered 2011-02-23: 510 mg via INTRAVENOUS
  Filled 2011-02-23: qty 17

## 2011-02-23 NOTE — Telephone Encounter (Signed)
GAVE PATIENT'S DAUGHTER APPOINTMENT FOR 04-2011.

## 2011-02-23 NOTE — Progress Notes (Signed)
Subjective:    Patient ID: Ryan Arellano, male    DOB: 02-May-1921, 77 y.o.   MRN: [id number]  HPI Patient presents today for examination a small ulceration is persistent on the left side of his buttocks at the rectal fold.  He states that it seems to continue to scale and is irritated there is no purulent discharge. His primary sedentary lifestyle since it is chronic multiple medical problems. He has end-stage renal disease and is currently followed for dialysis with renal service he also has a history of pulmonary hypertension atrial fibrillation and history of thrombocythemia.  In short he has multiple risk factors for stasis ulceration. There is no erythema around the site there are indications of infection or abscess   Review of Systems  Constitutional: Negative for fever and fatigue.  HENT: Negative for hearing loss, congestion, neck pain and postnasal drip.   Eyes: Negative for discharge, redness and visual disturbance.  Respiratory: Negative for cough, shortness of breath and wheezing.   Cardiovascular: Negative for leg swelling.  Gastrointestinal: Negative for abdominal pain, constipation and abdominal distention.  Genitourinary: Negative for urgency and frequency.  Musculoskeletal: Negative for joint swelling and arthralgias.  Skin: Negative for color change and rash.  Neurological: Negative for weakness and light-headedness.  Hematological: Negative for adenopathy.  Psychiatric/Behavioral: Negative for behavioral problems.   Past Medical History  Diagnosis Date  . Pulmonary hypertension   . Atrial fibrillation   . Thrombocytopenia   . Anemia   . Arthritis   . Diverticulitis   . Hypertension   . Renal insufficiency     History   Social History  . Marital Status: Widowed    Spouse Name: N/A    Number of Children: N/A  . Years of Education: N/A   Occupational History  . retired    Social History Main Topics  . Smoking status: Former Smoker    Quit date:  03/10/2010  . Smokeless tobacco: Not on file  . Alcohol Use: No  . Drug Use: No  . Sexually Active: No   Other Topics Concern  . Not on file   Social History Narrative  . No narrative on file    Past Surgical History  Procedure Date  . Ruptured colon   . S/p colectomy   . Tonsillectomy     Family History  Problem Relation Age of Onset  . Heart disease Father   . Hypertension Father     No Known Allergies  Current Outpatient Prescriptions on File Prior to Visit  Medication Sig Dispense Refill  . anagrelide (AGRYLIN) 0.5 MG capsule Take 0.5 mg by mouth. 5 caps daily        . aspirin 81 MG tablet Take 81 mg by mouth daily.        . carvedilol (COREG) 25 MG tablet Take 25 mg by mouth. 1.5 twice a day      . cloNIDine (CATAPRES) 0.2 MG tablet Take 1 tablet (0.2 mg total) by mouth 3 (three) times daily.  90 tablet  11  . colchicine 0.6 MG tablet Take 1 tablet (0.6 mg total) by mouth daily.  30 tablet  0  . doxazosin (CARDURA) 1 MG tablet TAKE 1 TABLET BY MOUTH AT BEDTIME  30 tablet  6  . doxazosin (CARDURA) 2 MG tablet Take 2 mg by mouth at bedtime.        Marland Kitchen esomeprazole (NEXIUM) 40 MG capsule Take 1 capsule (40 mg total) by mouth daily before breakfast.  90  capsule  3  . febuxostat (ULORIC) 40 MG tablet Take 40 mg by mouth daily.       . ferrous sulfate 325 (65 FE) MG tablet Take 325 mg by mouth daily with breakfast.        . folic acid (FOLVITE) 1 MG tablet TAKE 1 TABLET TWICE A DAY  180 tablet  2  . furosemide (LASIX) 40 MG tablet Take 40 mg by mouth daily.        . sildenafil (REVATIO) 20 MG tablet Take 20 mg by mouth 3 (three) times daily.        . traMADol (ULTRAM-ER) 200 MG 24 hr tablet Take 200 mg by mouth daily as needed.        Marland Kitchen DISCONTD: cloNIDine (CATAPRES) 0.2 MG tablet Take 1 tablet (0.2 mg total) by mouth 2 (two) times daily.  60 tablet  11    BP 144/80  Pulse 72  Temp 98.2 F (36.8 C)  Resp 18  Ht 5\' 5"  (1.651 m)  Wt 140 lb (63.504 kg)  BMI 23.30  kg/m2       Objective:   Physical Exam  Constitutional: He appears well-developed and well-nourished.  HENT:  Head: Normocephalic and atraumatic.  Eyes: Conjunctivae are normal. Pupils are equal, round, and reactive to light.  Neck: Normal range of motion. Neck supple.  Cardiovascular: Normal rate and regular rhythm.   Pulmonary/Chest: Effort normal and breath sounds normal.  Abdominal: Soft. Bowel sounds are normal.  Skin:       Quarters-sized area of erythema and scaling without abscess or cellulitis at the top of the left buttock          Assessment & Plan:  Patient is a small area of stasis ulceration we will treat with pressure reduction and and Bactroban cream applied 2 times a day to the site. Blood pressure is stable weight is stable Nutritional status reviewed with patient and patient's family as possible contribution to skin breakdown.  Consider coenzyme Q 10 and vitamin C supplementation

## 2011-02-23 NOTE — Progress Notes (Signed)
1500 assessed for piv,0 attempts,poor veins,second iv nurse to assess sarah isbister rn iv team

## 2011-02-24 ENCOUNTER — Telehealth: Payer: Self-pay | Admitting: Internal Medicine

## 2011-02-24 NOTE — Telephone Encounter (Signed)
Pt called left vm , called pt back , left message

## 2011-03-21 ENCOUNTER — Other Ambulatory Visit (HOSPITAL_COMMUNITY): Payer: Self-pay | Admitting: *Deleted

## 2011-03-23 ENCOUNTER — Encounter (HOSPITAL_COMMUNITY)
Admission: RE | Admit: 2011-03-23 | Discharge: 2011-03-23 | Disposition: A | Discharge status: Home / Self Care | Payer: Medicare Other | Source: Ambulatory Visit | Attending: Nephrology | Admitting: Nephrology

## 2011-03-23 ENCOUNTER — Encounter (HOSPITAL_COMMUNITY): Payer: Medicare Other

## 2011-03-23 DIAGNOSIS — D638 Anemia in other chronic diseases classified elsewhere: Secondary | ICD-10-CM | POA: Insufficient documentation

## 2011-03-23 DIAGNOSIS — N184 Chronic kidney disease, stage 4 (severe): Secondary | ICD-10-CM | POA: Insufficient documentation

## 2011-03-23 MED ORDER — FERUMOXYTOL INJECTION 510 MG/17 ML
INTRAVENOUS | Status: AC
  Administered 2011-03-23: 510 mg via INTRAVENOUS
  Filled 2011-03-23: qty 17

## 2011-03-23 MED ORDER — EPOETIN ALFA 10000 UNIT/ML IJ SOLN
INTRAMUSCULAR | Status: AC
  Administered 2011-03-23: 10000 [IU] via SUBCUTANEOUS
  Filled 2011-03-23: qty 1

## 2011-03-23 MED ORDER — FERUMOXYTOL INJECTION 510 MG/17 ML
510.0000 mg | INTRAVENOUS | Status: DC
  Administered 2011-03-23: 510 mg via INTRAVENOUS

## 2011-03-23 MED ORDER — EPOETIN ALFA 10000 UNIT/ML IJ SOLN: 10000.0000 [IU] | INTRAMUSCULAR | Status: DC

## 2011-03-27 ENCOUNTER — Other Ambulatory Visit: Payer: Self-pay | Admitting: Internal Medicine

## 2011-03-27 DIAGNOSIS — D473 Essential (hemorrhagic) thrombocythemia: Secondary | ICD-10-CM

## 2011-03-30 ENCOUNTER — Encounter (HOSPITAL_COMMUNITY)
Admission: RE | Admit: 2011-03-30 | Discharge: 2011-03-30 | Disposition: A | Discharge status: Home / Self Care | Payer: Medicare Other | Source: Ambulatory Visit | Attending: Nephrology | Admitting: Nephrology

## 2011-03-30 MED ORDER — FERUMOXYTOL INJECTION 510 MG/17 ML
INTRAVENOUS | Status: AC
  Administered 2011-03-30: 510 mg via INTRAVENOUS
  Filled 2011-03-30: qty 17

## 2011-03-30 MED ORDER — FERUMOXYTOL INJECTION 510 MG/17 ML
510.0000 mg | INTRAVENOUS | Status: AC
  Administered 2011-03-30: 510 mg via INTRAVENOUS

## 2011-04-17 ENCOUNTER — Ambulatory Visit (INDEPENDENT_AMBULATORY_CARE_PROVIDER_SITE_OTHER): Payer: Medicare Other | Admitting: Internal Medicine

## 2011-04-17 ENCOUNTER — Encounter: Payer: Self-pay | Admitting: Internal Medicine

## 2011-04-17 ENCOUNTER — Other Ambulatory Visit: Payer: Medicare Other | Admitting: Lab

## 2011-04-17 ENCOUNTER — Other Ambulatory Visit (HOSPITAL_COMMUNITY): Payer: Self-pay | Admitting: *Deleted

## 2011-04-17 VITALS — BP 120/60 | HR 60 | Temp 97.7°F | Resp 16 | Ht 65.0 in | Wt 140.0 lb

## 2011-04-17 DIAGNOSIS — N19 Unspecified kidney failure: Secondary | ICD-10-CM

## 2011-04-17 DIAGNOSIS — D649 Anemia, unspecified: Secondary | ICD-10-CM

## 2011-04-17 DIAGNOSIS — I4891 Unspecified atrial fibrillation: Secondary | ICD-10-CM

## 2011-04-17 DIAGNOSIS — I2789 Other specified pulmonary heart diseases: Secondary | ICD-10-CM

## 2011-04-17 DIAGNOSIS — B0229 Other postherpetic nervous system involvement: Secondary | ICD-10-CM

## 2011-04-17 DIAGNOSIS — N186 End stage renal disease: Secondary | ICD-10-CM

## 2011-04-17 MED ORDER — TAPENTADOL HCL 50 MG PO TABS: 50.0000 mg | ORAL_TABLET | Freq: Three times a day (TID) | ORAL | Status: DC

## 2011-04-17 MED ORDER — MUPIROCIN CALCIUM 2 % EX CREA: TOPICAL_CREAM | CUTANEOUS | Status: DC

## 2011-04-17 NOTE — Patient Instructions (Signed)
The patient is instructed to continue all medications as prescribed. Schedule followup with check out clerk upon leaving the clinic  

## 2011-04-17 NOTE — Progress Notes (Signed)
Subjective:    Patient ID: Ryan Arellano, male    DOB: [birth year], 78 y.o.   MRN: [id number]  HPI The patient is seen in both renal and oncology he has an appointment for Procrit later this month and a followup appointment with his renal and his oncologist.  He is followed for anemia and this thrombocytopenia related to renal failure. His blood pressure is stable his current medications he has no evidence of congestive heart failure He is also followed for pulmonary hypertension by our pulmonary division He has a history of hyperuricemia/gout He has not had any recent gout issues  The patient's acute complaint today is the postherpetic neuralgia he is on tramadol for postherpetic neuralgia and feels that this is inadequate to control the pain   Review of Systems  Constitutional: Positive for fatigue. Negative for fever.  HENT: Positive for rhinorrhea. Negative for hearing loss, congestion, neck pain and postnasal drip.   Eyes: Positive for redness. Negative for discharge and visual disturbance.  Respiratory: Positive for cough. Negative for shortness of breath and wheezing.   Cardiovascular: Negative for leg swelling.  Gastrointestinal: Negative for abdominal pain, constipation and abdominal distention.  Genitourinary: Negative for urgency and frequency.  Musculoskeletal: Positive for myalgias and joint swelling. Negative for arthralgias.  Skin: Negative for color change and rash.  Neurological: Negative for dizziness, weakness and light-headedness.  Hematological: Negative for adenopathy. Bruises/bleeds easily.  Psychiatric/Behavioral: Negative for behavioral problems and agitation.   Past Medical History  Diagnosis Date  . Pulmonary hypertension   . Atrial fibrillation   . Thrombocytopenia   . Anemia   . Arthritis   . Diverticulitis   . Hypertension   . Renal insufficiency     History   Social History  . Marital Status: Widowed    Spouse Name: N/A    Number of  Children: N/A  . Years of Education: N/A   Occupational History  . retired    Social History Main Topics  . Smoking status: Former Smoker    Quit date: 03/10/2010  . Smokeless tobacco: Not on file  . Alcohol Use: No  . Drug Use: No  . Sexually Active: No   Other Topics Concern  . Not on file   Social History Narrative  . No narrative on file    Past Surgical History  Procedure Date  . Ruptured colon   . S/p colectomy   . Tonsillectomy     Family History  Problem Relation Age of Onset  . Heart disease Father   . Hypertension Father     No Known Allergies  Current Outpatient Prescriptions on File Prior to Visit  Medication Sig Dispense Refill  . anagrelide (AGRYLIN) 0.5 MG capsule TAKE 5 CAPSULES BY MOUTH EVERY DAY  450 capsule  2  . aspirin 81 MG tablet Take 81 mg by mouth daily.        . carvedilol (COREG) 25 MG tablet Take 25 mg by mouth. 1.5 twice a day      . cloNIDine (CATAPRES) 0.2 MG tablet Take 1 tablet (0.2 mg total) by mouth 3 (three) times daily.  90 tablet  11  . doxazosin (CARDURA) 1 MG tablet TAKE 1 TABLET BY MOUTH AT BEDTIME  30 tablet  6  . esomeprazole (NEXIUM) 40 MG capsule Take 1 capsule (40 mg total) by mouth daily before breakfast.  90 capsule  3  . febuxostat (ULORIC) 40 MG tablet Take 40 mg by mouth daily.       Marland Kitchen  ferrous sulfate 325 (65 FE) MG tablet Take 325 mg by mouth daily with breakfast.        . folic acid (FOLVITE) 1 MG tablet TAKE 1 TABLET TWICE A DAY  180 tablet  2  . furosemide (LASIX) 40 MG tablet Take 40 mg by mouth daily.        . sildenafil (REVATIO) 20 MG tablet Take 20 mg by mouth 3 (three) times daily.        . traMADol (ULTRAM-ER) 200 MG 24 hr tablet Take 200 mg by mouth daily as needed.        Marland Kitchen DISCONTD: cloNIDine (CATAPRES) 0.2 MG tablet Take 1 tablet (0.2 mg total) by mouth 2 (two) times daily.  60 tablet  11  . DISCONTD: doxazosin (CARDURA) 2 MG tablet Take 2 mg by mouth at bedtime.          BP 120/60  Pulse 60   Temp 97.7 F (36.5 C)  Resp 16  Ht 5\' 5"  (1.651 m)  Wt 140 lb (63.504 kg)  BMI 23.30 kg/m2       Objective:   Physical Exam  Constitutional: He appears well-developed and well-nourished.       Elderly white male  Mild jaudice  HENT:  Head: Normocephalic and atraumatic.  Eyes: Conjunctivae are normal. Pupils are equal, round, and reactive to light.  Neck: Normal range of motion. Neck supple.  Cardiovascular: Normal rate and regular rhythm.   Murmur heard. Pulmonary/Chest: Effort normal and breath sounds normal. No respiratory distress. He has no wheezes.  Abdominal: Soft. Bowel sounds are normal.          Assessment & Plan:  He has significant postherpetic neuralgia pain that has not responded to the Ultram therefore we are going to try a prescription of medicine  nucynta 50 mg 3 times a day.  He is stable from the standpoint of his hypertension and pulmonary hypertension we will monitor a lipid and a liver today as well as a renal panel.  Will forward results of the renal panel the iron and CBC to Dr. Eliott Nine is renal physician.  We will see him back in 2 months time

## 2011-04-20 ENCOUNTER — Encounter (HOSPITAL_COMMUNITY)
Admission: RE | Admit: 2011-04-20 | Discharge: 2011-04-20 | Disposition: A | Discharge status: Home / Self Care | Payer: Medicare Other | Source: Ambulatory Visit | Attending: Nephrology | Admitting: Nephrology

## 2011-04-20 DIAGNOSIS — D638 Anemia in other chronic diseases classified elsewhere: Secondary | ICD-10-CM | POA: Insufficient documentation

## 2011-04-20 DIAGNOSIS — N184 Chronic kidney disease, stage 4 (severe): Secondary | ICD-10-CM | POA: Insufficient documentation

## 2011-04-20 LAB — IRON AND TIBC
Saturation Ratios: 67 % — ABNORMAL HIGH (ref 20–55)
UIBC: 60 ug/dL — ABNORMAL LOW (ref 125–400)

## 2011-04-20 MED ORDER — EPOETIN ALFA 10000 UNIT/ML IJ SOLN
10000.0000 [IU] | INTRAMUSCULAR | Status: AC
  Administered 2011-04-20: 10000 [IU] via SUBCUTANEOUS

## 2011-04-20 MED ORDER — EPOETIN ALFA 10000 UNIT/ML IJ SOLN
INTRAMUSCULAR | Status: AC
  Administered 2011-04-20: 10000 [IU] via SUBCUTANEOUS
  Filled 2011-04-20: qty 1

## 2011-04-21 LAB — FERRITIN: Ferritin: 1568 ng/mL — ABNORMAL HIGH (ref 22–322)

## 2011-04-24 ENCOUNTER — Ambulatory Visit (HOSPITAL_BASED_OUTPATIENT_CLINIC_OR_DEPARTMENT_OTHER): Payer: Medicare Other | Admitting: Internal Medicine

## 2011-04-24 ENCOUNTER — Other Ambulatory Visit: Payer: Self-pay | Admitting: Internal Medicine

## 2011-04-24 ENCOUNTER — Other Ambulatory Visit: Payer: Medicare Other | Admitting: Lab

## 2011-04-24 VITALS — BP 164/72 | HR 75 | Temp 96.7°F | Ht 65.0 in | Wt 141.3 lb

## 2011-04-24 DIAGNOSIS — D473 Essential (hemorrhagic) thrombocythemia: Secondary | ICD-10-CM

## 2011-04-24 LAB — CBC WITH DIFFERENTIAL/PLATELET
EOS%: 6.8 % (ref 0.0–7.0)
LYMPH%: 13.5 % — ABNORMAL LOW (ref 14.0–49.0)
MCHC: 32 g/dL (ref 32.0–36.0)
MONO#: 0.4 10*3/uL (ref 0.1–0.9)
Platelets: 98 10*3/uL — ABNORMAL LOW (ref 140–400)
RBC: 4.15 10*6/uL — ABNORMAL LOW (ref 4.20–5.82)
RDW: 17.5 % — ABNORMAL HIGH (ref 11.0–14.6)
WBC: 10.2 10*3/uL (ref 4.0–10.3)

## 2011-04-24 LAB — TECHNOLOGIST REVIEW

## 2011-04-24 LAB — LACTATE DEHYDROGENASE: LDH: 199 U/L (ref 94–250)

## 2011-04-24 NOTE — Progress Notes (Signed)
[facility] OFFICE PROGRESS NOTE  [name], MD [address] [place] [address]  DIAGNOSIS:  Essential thrombocythemia  CURRENT THERAPY: Anagrelide 2.5 mg by mouth daily.  INTERVAL HISTORY: Ryan Arellano 78 y.o. male returns to the clinic today for followup visit accompanied his daughter. The patient has no complaints today. He denied having any bleeding issues, bruises or ecchymosis. He denied having any significant weight loss or night sweats. No chest pain or shortness of breath, no cough or hemoptysis. He has repeat CBC performed earlier today and he is here for evaluation and discussion of his lab results.  MEDICAL HISTORY: Past Medical History  Diagnosis Date  . Pulmonary hypertension   . Atrial fibrillation   . Thrombocytopenia   . Anemia   . Arthritis   . Diverticulitis   . Hypertension   . Renal insufficiency     ALLERGIES:   has no known allergies.  MEDICATIONS:  Current Outpatient Prescriptions  Medication Sig Dispense Refill  . anagrelide (AGRYLIN) 0.5 MG capsule TAKE 5 CAPSULES BY MOUTH EVERY DAY  450 capsule  2  . aspirin 81 MG tablet Take 81 mg by mouth daily.        . carvedilol (COREG) 25 MG tablet Take 25 mg by mouth. 1.5 twice a day      . cloNIDine (CATAPRES) 0.2 MG tablet Take 1 tablet (0.2 mg total) by mouth 3 (three) times daily.  90 tablet  11  . doxazosin (CARDURA) 1 MG tablet TAKE 1 TABLET BY MOUTH AT BEDTIME  30 tablet  6  . esomeprazole (NEXIUM) 40 MG capsule Take 1 capsule (40 mg total) by mouth daily before breakfast.  90 capsule  3  . febuxostat (ULORIC) 40 MG tablet Take 40 mg by mouth daily.       . ferrous sulfate 325 (65 FE) MG tablet Take 325 mg by mouth daily with breakfast.        . folic acid (FOLVITE) 1 MG tablet TAKE 1 TABLET TWICE A DAY  180 tablet  2  . furosemide (LASIX) 40 MG tablet Take 40 mg by mouth daily.        . mupirocin cream (BACTROBAN) 2 % Apply to affected area 3 times daily  30 g   3  . sildenafil (REVATIO) 20 MG tablet Take 20 mg by mouth 3 (three) times daily.        . tapentadol (NUCYNTA) 50 MG TABS Take 1 tablet (50 mg total) by mouth 3 (three) times daily.  270 tablet  0  . DISCONTD: cloNIDine (CATAPRES) 0.2 MG tablet Take 1 tablet (0.2 mg total) by mouth 2 (two) times daily.  60 tablet  11    SURGICAL HISTORY:  Past Surgical History  Procedure Date  . Ruptured colon   . S/p colectomy   . Tonsillectomy     REVIEW OF SYSTEMS:  A comprehensive review of systems was negative.   PHYSICAL EXAMINATION: General appearance: alert, cooperative and no distress Head: Normocephalic, without obvious abnormality, atraumatic Neck: no adenopathy Lymph nodes: Cervical, supraclavicular, and axillary nodes normal. Resp: clear to auscultation bilaterally Cardio: regular rate and rhythm, S1, S2 normal, no murmur, click, rub or gallop GI: soft, non-tender; bowel sounds normal; no masses,  no organomegaly Extremities: extremities normal, atraumatic, no cyanosis or edema Neurologic: Alert and oriented X 3, normal strength and tone. Normal symmetric reflexes. Normal coordination and gait  ECOG PERFORMANCE STATUS: 1 - Symptomatic but completely ambulatory  Blood  pressure 164/72, pulse 75, temperature 96.7 F (35.9 C), temperature source Oral, height 5\' 5"  (1.651 m), weight 141 lb 4.8 oz (64.093 kg).  LABORATORY DATA: Lab Results  Component Value Date   WBC 10.2 04/24/2011   HGB 11.8* 04/24/2011   HCT 36.9* 04/24/2011   MCV 88.9 04/24/2011   PLT 98* 04/24/2011      Chemistry      Component Value Date/Time   NA 143 11/04/2010 1407   K 4.3 11/04/2010 1407   CL 114* 11/04/2010 1407   CO2 20 11/04/2010 1407   BUN 75* 11/04/2010 1407   CREATININE 2.8* 11/04/2010 1407      Component Value Date/Time   CALCIUM 8.1* 11/04/2010 1407   ALKPHOS 88 03/23/2010 1106   AST 17 03/23/2010 1106   ALT 11 03/23/2010 1106   BILITOT 0.3 03/23/2010 1106        ASSESSMENT: This is a very  pleasant 78 years old white male with essential thrombocythemia currently on anagrelide 2.5 mg by mouth daily. The patient is tolerating his treatment fairly well. Has significant decline in his platelet count in the last few months, now down to 98,000.  PLAN: I discussed the lab result with the patient and his daughter. I recommended for him to reduce the dose anagrelide to 2 mg by mouth daily. I will monitor his CBC closely and adjust his dose of anagrelide accordingly. The patient come back for followup visit in 2 weeks.  All questions were answered. The patient knows to call the clinic with any problems, questions or concerns. We can certainly see the patient much sooner if necessary.

## 2011-04-26 ENCOUNTER — Telehealth: Payer: Self-pay | Admitting: Internal Medicine

## 2011-04-26 NOTE — Telephone Encounter (Signed)
called to r/s appt from 1/28 to 1/29 due to conflict    aom

## 2011-05-08 ENCOUNTER — Other Ambulatory Visit: Payer: Medicare Other | Admitting: Lab

## 2011-05-08 ENCOUNTER — Ambulatory Visit: Payer: Medicare Other | Admitting: Physician Assistant

## 2011-05-09 ENCOUNTER — Other Ambulatory Visit (HOSPITAL_BASED_OUTPATIENT_CLINIC_OR_DEPARTMENT_OTHER): Payer: Medicare Other | Admitting: Lab

## 2011-05-09 ENCOUNTER — Ambulatory Visit (HOSPITAL_BASED_OUTPATIENT_CLINIC_OR_DEPARTMENT_OTHER): Payer: Medicare Other | Admitting: Internal Medicine

## 2011-05-09 ENCOUNTER — Telehealth: Payer: Self-pay | Admitting: Internal Medicine

## 2011-05-09 ENCOUNTER — Ambulatory Visit: Payer: Medicare Other | Admitting: Physician Assistant

## 2011-05-09 VITALS — BP 176/70 | HR 57 | Temp 97.6°F | Ht 65.0 in | Wt 139.8 lb

## 2011-05-09 DIAGNOSIS — D473 Essential (hemorrhagic) thrombocythemia: Secondary | ICD-10-CM

## 2011-05-09 LAB — LACTATE DEHYDROGENASE: LDH: 209 U/L (ref 94–250)

## 2011-05-09 LAB — CBC WITH DIFFERENTIAL/PLATELET
Basophils Absolute: 0 10*3/uL (ref 0.0–0.1)
EOS%: 5.1 % (ref 0.0–7.0)
Eosinophils Absolute: 0.5 10*3/uL (ref 0.0–0.5)
HCT: 35.6 % — ABNORMAL LOW (ref 38.4–49.9)
HGB: 11.7 g/dL — ABNORMAL LOW (ref 13.0–17.1)
MCH: 29.4 pg (ref 27.2–33.4)
MONO#: 0.7 10*3/uL (ref 0.1–0.9)
NEUT#: 7.5 10*3/uL — ABNORMAL HIGH (ref 1.5–6.5)
NEUT%: 76.9 % — ABNORMAL HIGH (ref 39.0–75.0)
RDW: 18.5 % — ABNORMAL HIGH (ref 11.0–14.6)
lymph#: 1 10*3/uL (ref 0.9–3.3)

## 2011-05-09 LAB — COMPREHENSIVE METABOLIC PANEL
AST: 14 U/L (ref 0–37)
BUN: 81 mg/dL — ABNORMAL HIGH (ref 6–23)
CO2: 17 mEq/L — ABNORMAL LOW (ref 19–32)
Calcium: 8.2 mg/dL — ABNORMAL LOW (ref 8.4–10.5)
Chloride: 111 mEq/L (ref 96–112)
Creatinine, Ser: 3.2 mg/dL — ABNORMAL HIGH (ref 0.50–1.35)

## 2011-05-09 NOTE — Progress Notes (Signed)
[facility] OFFICE PROGRESS NOTE  Ryan Mew, MD, MD [address] [place] [address]  DIAGNOSIS: Essential thrombocythemia   CURRENT THERAPY: Anagrelide 2.0 mg by mouth daily.   INTERVAL HISTORY: Ryan Arellano 78 y.o. male returns to the clinic today for followup visit accompanied his daughter. The patient has no complaints today he is feeling fine. He is tolerating his treatment with anagrelide well. He denied having any bleeding, no bruises or ecchymosis. He has repeat CBC performed today and he is here for evaluation and discussion of his lab results.  MEDICAL HISTORY: Past Medical History  Diagnosis Date  . Pulmonary hypertension   . Atrial fibrillation   . Thrombocytopenia   . Anemia   . Arthritis   . Diverticulitis   . Hypertension   . Renal insufficiency     ALLERGIES:   has no known allergies.  MEDICATIONS:  Current Outpatient Prescriptions  Medication Sig Dispense Refill  . anagrelide (AGRYLIN) 0.5 MG capsule TAKE 5 CAPSULES BY MOUTH EVERY DAY  450 capsule  2  . aspirin 81 MG tablet Take 81 mg by mouth daily.        . carvedilol (COREG) 25 MG tablet Take 25 mg by mouth. 1.5 twice a day      . cloNIDine (CATAPRES) 0.2 MG tablet Take 1 tablet (0.2 mg total) by mouth 3 (three) times daily.  90 tablet  11  . doxazosin (CARDURA) 1 MG tablet TAKE 1 TABLET BY MOUTH AT BEDTIME  30 tablet  6  . esomeprazole (NEXIUM) 40 MG capsule Take 1 capsule (40 mg total) by mouth daily before breakfast.  90 capsule  3  . febuxostat (ULORIC) 40 MG tablet Take 40 mg by mouth daily.       . ferrous sulfate 325 (65 FE) MG tablet Take 325 mg by mouth daily with breakfast.        . folic acid (FOLVITE) 1 MG tablet TAKE 1 TABLET TWICE A DAY  180 tablet  2  . furosemide (LASIX) 40 MG tablet Take 80 mg by mouth daily.       . mupirocin cream (BACTROBAN) 2 % Apply to affected area 3 times daily  30 g  3  . sildenafil (REVATIO) 20 MG tablet Take 20  mg by mouth 3 (three) times daily.        . tapentadol (NUCYNTA) 50 MG TABS Take 1 tablet (50 mg total) by mouth 3 (three) times daily.  270 tablet  0  . DISCONTD: cloNIDine (CATAPRES) 0.2 MG tablet Take 1 tablet (0.2 mg total) by mouth 2 (two) times daily.  60 tablet  11    SURGICAL HISTORY:  Past Surgical History  Procedure Date  . Ruptured colon   . S/p colectomy   . Tonsillectomy     REVIEW OF SYSTEMS:  A comprehensive review of systems was negative.   PHYSICAL EXAMINATION: General appearance: alert, cooperative and no distress Resp: clear to auscultation bilaterally Cardio: regular rate and rhythm, S1, S2 normal, no murmur, click, rub or gallop GI: soft, non-tender; bowel sounds normal; no masses,  no organomegaly Extremities: extremities normal, atraumatic, no cyanosis or edema  ECOG PERFORMANCE STATUS: 0 - Asymptomatic  Blood pressure 176/70, pulse 57, temperature 97.6 F (36.4 C), temperature source Oral, height 5\' 5"  (1.651 m), weight 139 lb 12.8 oz (63.413 kg).  LABORATORY DATA: Lab Results  Component Value Date   WBC 9.7 05/09/2011  HGB 11.7* 05/09/2011   HCT 35.6* 05/09/2011   MCV 89.7 05/09/2011   PLT 279 05/09/2011      Chemistry      Component Value Date/Time   NA 143 05/09/2011 1345   K 3.9 05/09/2011 1345   CL 111 05/09/2011 1345   CO2 17* 05/09/2011 1345   BUN 81* 05/09/2011 1345   CREATININE 3.20* 05/09/2011 1345      Component Value Date/Time   CALCIUM 8.2* 05/09/2011 1345   ALKPHOS 121* 05/09/2011 1345   AST 14 05/09/2011 1345   ALT 19 05/09/2011 1345   BILITOT 0.6 05/09/2011 1345       RADIOGRAPHIC STUDIES: No results found.  ASSESSMENT: This is a very pleasant 78 years old white male with essential thrombocythemia, currently on anagrelide 2.0 milligram by mouth daily.  PLAN: The patient doing fine and tolerating his treatment well and his platelets count improved. I recommended for the patient to continue on his treatment with the current dose. I  would see the patient back for followup visit in 2 months with repeat CBC monthly.  All questions were answered. The patient knows to call the clinic with any problems, questions or concerns. We can certainly see the patient much sooner if necessary.

## 2011-05-09 NOTE — Telephone Encounter (Signed)
appts made and printed for feb and mar    aom

## 2011-05-18 ENCOUNTER — Encounter (HOSPITAL_COMMUNITY)
Admission: RE | Admit: 2011-05-18 | Discharge: 2011-05-18 | Disposition: A | Discharge status: Home / Self Care | Payer: Medicare Other | Source: Ambulatory Visit | Attending: Nephrology | Admitting: Nephrology

## 2011-05-18 DIAGNOSIS — N184 Chronic kidney disease, stage 4 (severe): Secondary | ICD-10-CM | POA: Insufficient documentation

## 2011-05-18 DIAGNOSIS — D638 Anemia in other chronic diseases classified elsewhere: Secondary | ICD-10-CM | POA: Insufficient documentation

## 2011-05-18 LAB — IRON AND TIBC
Iron: 65 ug/dL (ref 42–135)
Saturation Ratios: 33 % (ref 20–55)
UIBC: 131 ug/dL (ref 125–400)

## 2011-05-18 MED ORDER — EPOETIN ALFA 10000 UNIT/ML IJ SOLN
INTRAMUSCULAR | Status: AC
  Filled 2011-05-18: qty 1

## 2011-05-18 MED ORDER — EPOETIN ALFA 10000 UNIT/ML IJ SOLN
10000.0000 [IU] | INTRAMUSCULAR | Status: AC
  Administered 2011-05-18: 10000 [IU] via SUBCUTANEOUS

## 2011-05-22 ENCOUNTER — Other Ambulatory Visit: Payer: Self-pay | Admitting: *Deleted

## 2011-05-22 MED ORDER — DOXAZOSIN MESYLATE 1 MG PO TABS: 1.0000 mg | ORAL_TABLET | Freq: Every day | ORAL | Status: DC

## 2011-05-29 ENCOUNTER — Other Ambulatory Visit: Payer: Self-pay | Admitting: Internal Medicine

## 2011-06-07 ENCOUNTER — Other Ambulatory Visit (HOSPITAL_BASED_OUTPATIENT_CLINIC_OR_DEPARTMENT_OTHER): Payer: Medicare Other

## 2011-06-07 DIAGNOSIS — D473 Essential (hemorrhagic) thrombocythemia: Secondary | ICD-10-CM

## 2011-06-07 LAB — CBC WITH DIFFERENTIAL/PLATELET
Eosinophils Absolute: 0.4 10*3/uL (ref 0.0–0.5)
HCT: 35.3 % — ABNORMAL LOW (ref 38.4–49.9)
LYMPH%: 9.3 % — ABNORMAL LOW (ref 14.0–49.0)
MCHC: 31.4 g/dL — ABNORMAL LOW (ref 32.0–36.0)
MCV: 92.2 fL (ref 79.3–98.0)
MONO#: 0.9 10*3/uL (ref 0.1–0.9)
MONO%: 9.4 % (ref 0.0–14.0)
NEUT%: 76.3 % — ABNORMAL HIGH (ref 39.0–75.0)
Platelets: 311 10*3/uL (ref 140–400)
RBC: 3.83 10*6/uL — ABNORMAL LOW (ref 4.20–5.82)
WBC: 9.5 10*3/uL (ref 4.0–10.3)

## 2011-06-13 ENCOUNTER — Other Ambulatory Visit (HOSPITAL_COMMUNITY): Payer: Self-pay | Admitting: *Deleted

## 2011-06-14 ENCOUNTER — Ambulatory Visit (INDEPENDENT_AMBULATORY_CARE_PROVIDER_SITE_OTHER): Payer: Medicare Other | Admitting: Internal Medicine

## 2011-06-14 VITALS — BP 158/84 | HR 72 | Temp 97.9°F | Resp 18 | Ht 65.0 in | Wt 143.0 lb

## 2011-06-14 DIAGNOSIS — N19 Unspecified kidney failure: Secondary | ICD-10-CM

## 2011-06-14 DIAGNOSIS — B0229 Other postherpetic nervous system involvement: Secondary | ICD-10-CM

## 2011-06-14 DIAGNOSIS — L0591 Pilonidal cyst without abscess: Secondary | ICD-10-CM

## 2011-06-14 MED ORDER — ESOMEPRAZOLE MAGNESIUM 40 MG PO CPDR: 40.0000 mg | DELAYED_RELEASE_CAPSULE | Freq: Every day | ORAL | Status: DC

## 2011-06-14 MED ORDER — TRAMADOL HCL 50 MG PO TABS: 50.0000 mg | ORAL_TABLET | Freq: Four times a day (QID) | ORAL | Status: DC | PRN

## 2011-06-14 MED ORDER — CLINDAMYCIN PHOSPHATE 1 % EX LOTN: TOPICAL_LOTION | Freq: Two times a day (BID) | CUTANEOUS | Status: DC

## 2011-06-14 MED ORDER — FUROSEMIDE 80 MG PO TABS: 160.0000 mg | ORAL_TABLET | Freq: Every day | ORAL | Status: DC

## 2011-06-14 NOTE — Patient Instructions (Addendum)
The patient is instructed to continue all medications as prescribed. Schedule followup with check out clerk upon leaving the clinic Apply the new antibiotic to the spot if it persists will refer to surgeon for removal

## 2011-06-15 ENCOUNTER — Encounter (HOSPITAL_COMMUNITY)
Admission: RE | Admit: 2011-06-15 | Discharge: 2011-06-15 | Disposition: A | Discharge status: Home / Self Care | Payer: Medicare Other | Source: Ambulatory Visit | Attending: Nephrology | Admitting: Nephrology

## 2011-06-15 ENCOUNTER — Encounter: Payer: Self-pay | Admitting: Internal Medicine

## 2011-06-15 DIAGNOSIS — N184 Chronic kidney disease, stage 4 (severe): Secondary | ICD-10-CM | POA: Insufficient documentation

## 2011-06-15 DIAGNOSIS — D638 Anemia in other chronic diseases classified elsewhere: Secondary | ICD-10-CM | POA: Insufficient documentation

## 2011-06-15 LAB — RENAL FUNCTION PANEL
Albumin: 3.5 g/dL (ref 3.5–5.2)
Calcium: 9.3 mg/dL (ref 8.4–10.5)
GFR calc Af Amer: 18 mL/min — ABNORMAL LOW (ref >90)
GFR calc non Af Amer: 15 mL/min — ABNORMAL LOW (ref >90)
Phosphorus: 4.9 mg/dL — ABNORMAL HIGH (ref 2.3–4.6)
Potassium: 4.3 mEq/L (ref 3.5–5.1)
Sodium: 144 mEq/L (ref 135–145)

## 2011-06-15 LAB — FERRITIN: Ferritin: 1079 ng/mL — ABNORMAL HIGH (ref 22–322)

## 2011-06-15 LAB — IRON AND TIBC
Saturation Ratios: 26 % (ref 20–55)
TIBC: 224 ug/dL (ref 215–435)

## 2011-06-15 LAB — POCT HEMOGLOBIN-HEMACUE: Hemoglobin: 11.7 g/dL — ABNORMAL LOW (ref 13.0–17.0)

## 2011-06-15 MED ORDER — EPOETIN ALFA 10000 UNIT/ML IJ SOLN
INTRAMUSCULAR | Status: AC
  Administered 2011-06-15: 10000 [IU] via SUBCUTANEOUS
  Filled 2011-06-15: qty 1

## 2011-06-15 MED ORDER — EPOETIN ALFA 10000 UNIT/ML IJ SOLN: 10000.0000 [IU] | INTRAMUSCULAR | Status: DC

## 2011-06-15 NOTE — Progress Notes (Signed)
Subjective:    Patient ID: Ryan Arellano, male    DOB: January 27, 1922, 78 y.o.   MRN: [id number]  HPI Patient has a history of hypertension chronic renal failure and atrial fibrillation he presents today for recheck.  His blood pressure is moderately elevated today on initial reading of the recheck showed a lower blood pressure 150/84.  He is in chronic atrial fibrillation is chronic renal failure this followed by the renal doctors and has been a recent adjustment of his medications.  He states that he's been generally feeling well has had no dizziness no dyspnea on exertion nor has he experienced any syncopal episodes.  He states that he does have a cystic sore spot on his upper left buttock cheek that has been treated with Bactroban ointment and has not responded    Review of Systems  Constitutional: Negative for fever and fatigue.  HENT: Negative for hearing loss, congestion, neck pain and postnasal drip.   Eyes: Negative for discharge, redness and visual disturbance.  Respiratory: Negative for cough, shortness of breath and wheezing.   Cardiovascular: Negative for leg swelling.  Gastrointestinal: Negative for abdominal pain, constipation and abdominal distention.  Genitourinary: Negative for urgency and frequency.  Musculoskeletal: Negative for joint swelling and arthralgias.  Skin: Negative for color change and rash.  Neurological: Negative for weakness and light-headedness.  Hematological: Negative for adenopathy.  Psychiatric/Behavioral: Negative for behavioral problems.   Past Medical History  Diagnosis Date  . Pulmonary hypertension   . Atrial fibrillation   . Thrombocytopenia   . Anemia   . Arthritis   . Diverticulitis   . Hypertension   . Renal insufficiency     History   Social History  . Marital Status: Widowed    Spouse Name: N/A    Number of Children: N/A  . Years of Education: N/A   Occupational History  . retired    Social History Main Topics  .  Smoking status: Former Smoker    Quit date: 03/10/2010  . Smokeless tobacco: Not on file  . Alcohol Use: No  . Drug Use: No  . Sexually Active: No   Other Topics Concern  . Not on file   Social History Narrative  . No narrative on file    Past Surgical History  Procedure Date  . Ruptured colon   . S/p colectomy   . Tonsillectomy     Family History  Problem Relation Age of Onset  . Heart disease Father   . Hypertension Father     Not on File  Current Outpatient Prescriptions on File Prior to Visit  Medication Sig Dispense Refill  . anagrelide (AGRYLIN) 0.5 MG capsule TAKE 5 CAPSULES BY MOUTH EVERY DAY  450 capsule  2  . aspirin 81 MG tablet Take 81 mg by mouth daily.        . carvedilol (COREG) 25 MG tablet Take 25 mg by mouth. 1.5 twice a day      . cloNIDine (CATAPRES) 0.2 MG tablet Take 1 tablet (0.2 mg total) by mouth 3 (three) times daily.  90 tablet  11  . doxazosin (CARDURA) 1 MG tablet Take 1 tablet (1 mg total) by mouth at bedtime.  30 tablet  11  . esomeprazole (NEXIUM) 40 MG capsule Take 1 capsule (40 mg total) by mouth daily before breakfast.  90 capsule  3  . febuxostat (ULORIC) 40 MG tablet Take 40 mg by mouth daily.       . folic acid (FOLVITE)  1 MG tablet TAKE 1 TABLET TWICE A DAY  180 tablet  2  . furosemide (LASIX) 80 MG tablet Take 2 tablets (160 mg total) by mouth daily.  120 tablet    . mupirocin cream (BACTROBAN) 2 % Apply to affected area 3 times daily  30 g  3  . sildenafil (REVATIO) 20 MG tablet Take 20 mg by mouth 3 (three) times daily.        Marland Kitchen DISCONTD: cloNIDine (CATAPRES) 0.2 MG tablet Take 1 tablet (0.2 mg total) by mouth 2 (two) times daily.  60 tablet  11    BP 158/84  Pulse 72  Temp 97.9 F (36.6 C)  Resp 18  Ht 5\' 5"  (1.651 m)  Wt 143 lb (64.864 kg)  BMI 23.80 kg/m2       Objective:   Physical Exam  Nursing note and vitals reviewed. Constitutional: He appears well-developed and well-nourished.  HENT:  Head: Normocephalic  and atraumatic.  Eyes: Conjunctivae are normal. Pupils are equal, round, and reactive to light.  Neck: Normal range of motion. Neck supple.  Cardiovascular: Normal rate and regular rhythm.   Pulmonary/Chest: Effort normal and breath sounds normal.  Abdominal: Soft. Bowel sounds are normal.          Assessment & Plan:  The patient's atrial fibrillation is stable.  His hypertension was slightly elevated today.  He has had a recent medication change and is followed by renal service for chronic renal failure approaching dialysis.  He has persistent postherpetic neuralgia and asked for a refill on his medications.  Patient has a cystic lesion on the upper buttock may be a pilonidal cyst Will change antibiotics to Cleocin topical the cyst continues to evolve would refer to Gen. surgery for I&D

## 2011-06-22 ENCOUNTER — Encounter: Payer: Self-pay | Admitting: Emergency Medicine

## 2011-06-22 ENCOUNTER — Other Ambulatory Visit: Payer: Self-pay | Admitting: Emergency Medicine

## 2011-06-22 ENCOUNTER — Ambulatory Visit (INDEPENDENT_AMBULATORY_CARE_PROVIDER_SITE_OTHER): Payer: Medicare Other | Admitting: Emergency Medicine

## 2011-06-22 VITALS — BP 152/64 | HR 53 | Temp 98.2°F | Ht 61.5 in | Wt 144.6 lb

## 2011-06-22 DIAGNOSIS — I2789 Other specified pulmonary heart diseases: Secondary | ICD-10-CM

## 2011-06-22 NOTE — Progress Notes (Signed)
History of Present Illness:  78 yo man, hx cardiomyopathy (believed non-ischemic), A Fib, thrombocytopenia (Mohamed), renal insuffiency Eliott Nine). Also with PAH likely due to his L sided disease, has been on sildenifil x 2 years (started by cardiology in [place]). No longer on coumadin. No hx known lung disease, although he did formerly smoke (10pk-yrs). Getting his Revatio direct from [employer] with assistance program.   ROV 06/22/11 -- Follows up for secondary PAH (NICM, A Fib, renal insuff). He sees Dr Eliott Nine, has had his diuretics increased due to increased LE edema over the last 3 months. May be more dyspneic with exertion. He has had some presyncope or wooziness when he takes tramadol. Never syncope. No HA.   Filed Vitals:   06/22/11 1453  BP: 152/64  Pulse: 53  Temp: 98.2 F (36.8 C)  Gen: Pleasant, elderly man, in no distress,  normal affect  ENT: No lesions,  mouth clear,  oropharynx clear, no postnasal drip  Neck: No JVD, no TMG, no carotid bruits  Lungs: No use of accessory muscles, no dullness to percussion, clear without rales or rhonchi  Cardiovascular: RRR, heart sounds normal, no murmur or gallops, 1+ pretibial edema  Musculoskeletal: No deformities, no cyanosis or clubbing  Neuro: alert, non focal  Skin: Warm, no lesions or rashes  03/18/10 Ambulatory Pulse Oximetry  Resting;  HR__77___ 02 Sat__98% on room air___  Lap1 (185 feet) HR_92____ 02 Sat_98% on room air____  Lap2 (185 feet) HR_96____ 02 Sat__100% on room air___  Lap3 (185 feet) HR__101___ 02 Sat__98% on room air___  _x__Test Completed without Difficulty  ___Test Stopped due to:  By: Michel Bickers CMA   BMET    Component Value Date/Time   NA 144 06/15/2011 1338   K 4.3 06/15/2011 1338   CL 104 06/15/2011 1338   CO2 27 06/15/2011 1338   GLUCOSE 124* 06/15/2011 1338   BUN 103* 06/15/2011 1338   CREATININE 3.36* 06/15/2011 1338   CALCIUM 9.3 06/15/2011 1338   GFRNONAA 15* 06/15/2011 1338   GFRAA 18* 06/15/2011 1338     PULMONARY HYPERTENSION Secondary PAH due to L sided disease. He can't recall whether the med every helped him. I am hesitant to continue the Revatio since it can cause potential side effects. At the same time, don';t want to stop it if it is helping to keep his CHF and renal failure in line. Will stop it temporarily and follow edema, pulm fxn, symptoms. If he misses it then will restart, if he doesn't and if no new sx then we will stop the revatio.

## 2011-06-22 NOTE — Assessment & Plan Note (Signed)
Secondary PAH due to L sided disease. He can't recall whether the med every helped him. I am hesitant to continue the Revatio since it can cause potential side effects. At the same time, don';t want to stop it if it is helping to keep his CHF and renal failure in line. Will stop it temporarily and follow edema, pulm fxn, symptoms. If he misses it then will restart, if he doesn't and if no new sx then we will stop the revatio.

## 2011-06-22 NOTE — Patient Instructions (Signed)
Please stop Revatio for now Keep track of your breathing symptoms, you swelling so thwe can review next time. If your breathing or symptoms worsen, then we can restart the Revatio. Otherwise, we will stay off of it.  Follow with Dr Delton Coombes in 1 month to review

## 2011-06-23 ENCOUNTER — Other Ambulatory Visit: Payer: Self-pay | Admitting: Internal Medicine

## 2011-06-23 NOTE — Telephone Encounter (Signed)
Pt saw Dr Delton Coombes yesterday, and was taken off the Revatio due to his age and side effects.  Daughter is concerned that he may not be treated for his AF now??  Should he see a Cardiologist?  He has an Echo scheduled next week.

## 2011-06-28 ENCOUNTER — Ambulatory Visit (HOSPITAL_COMMUNITY): Payer: Medicare Other | Attending: Cardiovascular Disease

## 2011-06-28 ENCOUNTER — Other Ambulatory Visit: Payer: Self-pay

## 2011-06-28 DIAGNOSIS — J4489 Other specified chronic obstructive pulmonary disease: Secondary | ICD-10-CM | POA: Insufficient documentation

## 2011-06-28 DIAGNOSIS — J449 Chronic obstructive pulmonary disease, unspecified: Secondary | ICD-10-CM | POA: Insufficient documentation

## 2011-06-28 DIAGNOSIS — I2789 Other specified pulmonary heart diseases: Secondary | ICD-10-CM

## 2011-06-28 DIAGNOSIS — I428 Other cardiomyopathies: Secondary | ICD-10-CM

## 2011-06-30 ENCOUNTER — Encounter: Payer: Self-pay | Admitting: Emergency Medicine

## 2011-07-03 ENCOUNTER — Telehealth: Payer: Self-pay | Admitting: Internal Medicine

## 2011-07-03 ENCOUNTER — Ambulatory Visit (HOSPITAL_BASED_OUTPATIENT_CLINIC_OR_DEPARTMENT_OTHER): Payer: Medicare Other | Admitting: Internal Medicine

## 2011-07-03 ENCOUNTER — Other Ambulatory Visit: Payer: Medicare Other | Admitting: Lab

## 2011-07-03 VITALS — BP 181/78 | HR 57 | Temp 97.0°F | Ht 61.5 in | Wt 134.2 lb

## 2011-07-03 DIAGNOSIS — D473 Essential (hemorrhagic) thrombocythemia: Secondary | ICD-10-CM

## 2011-07-03 LAB — CBC WITH DIFFERENTIAL/PLATELET
BASO%: 0.3 % (ref 0.0–2.0)
EOS%: 9 % — ABNORMAL HIGH (ref 0.0–7.0)
HGB: 12.3 g/dL — ABNORMAL LOW (ref 13.0–17.1)
MCH: 29.7 pg (ref 27.2–33.4)
MCV: 91.2 fL (ref 79.3–98.0)
MONO%: 7.8 % (ref 0.0–14.0)
RBC: 4.15 10*6/uL — ABNORMAL LOW (ref 4.20–5.82)
RDW: 16 % — ABNORMAL HIGH (ref 11.0–14.6)
lymph#: 1.2 10*3/uL (ref 0.9–3.3)

## 2011-07-03 LAB — COMPREHENSIVE METABOLIC PANEL
ALT: 17 U/L (ref 0–53)
AST: 19 U/L (ref 0–37)
Albumin: 4.2 g/dL (ref 3.5–5.2)
Alkaline Phosphatase: 164 U/L — ABNORMAL HIGH (ref 39–117)
Calcium: 9.1 mg/dL (ref 8.4–10.5)
Chloride: 99 mEq/L (ref 96–112)
Potassium: 4.1 mEq/L (ref 3.5–5.3)
Sodium: 141 mEq/L (ref 135–145)
Total Protein: 6.8 g/dL (ref 6.0–8.3)

## 2011-07-03 NOTE — Progress Notes (Signed)
[facility] [phone]   [phone]  OFFICE PROGRESS NOTE  Ryan Mew, MD, MD [address] [place] [address]  DIAGNOSIS: Essential thrombocythemia   CURRENT THERAPY: Anagrelide 2.0 mg by mouth daily.  INTERVAL HISTORY: Ryan Arellano 78 y.o. male returns to the clinic today for 2 month followup visit accompanied by his daughter. The patient is feeling fine today with no specific complaints. He denied having any weight loss or night sweats. He has no bleeding issues. No neurological abnormalities. He has repeat CBC performed earlier today and is here for evaluation and discussion of his lab results.  MEDICAL HISTORY: Past Medical History  Diagnosis Date  . Pulmonary hypertension   . Atrial fibrillation   . Thrombocytopenia   . Anemia   . Arthritis   . Diverticulitis   . Hypertension   . Renal insufficiency     ALLERGIES:   has no known allergies.  MEDICATIONS:  Current Outpatient Prescriptions  Medication Sig Dispense Refill  . anagrelide (AGRYLIN) 0.5 MG capsule 0.5 mg 4 (four) times daily.       Marland Kitchen aspirin 81 MG tablet Take 81 mg by mouth daily.        . calcium acetate (PHOSLO) 667 MG capsule Take 667 mg by mouth 3 (three) times daily with meals.      . carvedilol (COREG) 25 MG tablet Take 25 mg by mouth. 1.5 twice a day      . cloNIDine (CATAPRES) 0.2 MG tablet Take 1 tablet (0.2 mg total) by mouth 3 (three) times daily.  90 tablet  11  . doxazosin (CARDURA) 1 MG tablet Take 1 tablet (1 mg total) by mouth at bedtime.  30 tablet  11  . esomeprazole (NEXIUM) 40 MG capsule Take 1 capsule (40 mg total) by mouth daily before breakfast.  90 capsule  3  . febuxostat (ULORIC) 40 MG tablet Take 40 mg by mouth daily.       . folic acid (FOLVITE) 1 MG tablet TAKE 1 TABLET TWICE A DAY  180 tablet  2  . furosemide (LASIX) 80 MG tablet Take 160 mg by mouth 2 (two) times daily.      . hydrALAZINE (APRESOLINE) 50 MG  tablet Take 50 mg by mouth 2 (two) times daily.      . sodium bicarbonate 650 MG tablet Take 650 mg by mouth 3 (three) times daily.      . traMADol (ULTRAM) 50 MG tablet Take 200 mg by mouth every 6 (six) hours as needed.      . calcitRIOL (ROCALTROL) 0.25 MCG capsule Take 0.25 mcg by mouth. 1 on Monday ,wednesday, and friday      . clindamycin (CLEOCIN-T) 1 % lotion Apply topically 2 (two) times daily.  60 mL  3  . DISCONTD: NEXIUM 40 MG capsule TAKE ONE CAPSULE EVERY DAY  90 capsule  1    SURGICAL HISTORY:  Past Surgical History  Procedure Date  . Ruptured colon   . S/p colectomy   . Tonsillectomy     REVIEW OF SYSTEMS:  A comprehensive review of systems was negative.   PHYSICAL EXAMINATION: General appearance: alert, cooperative and no distress Lymph nodes: Cervical, supraclavicular, and axillary nodes normal. Resp: clear to auscultation bilaterally Cardio: regular rate and rhythm, S1, S2 normal, no murmur, click, rub or gallop GI: soft, non-tender; bowel sounds normal; no masses,  no organomegaly Extremities: extremities normal, atraumatic, no cyanosis or edema Neurologic:  Alert and oriented X 3, normal strength and tone. Normal symmetric reflexes. Normal coordination and gait  ECOG PERFORMANCE STATUS: 0 - Asymptomatic  Blood pressure 181/78, pulse 57, temperature 97 F (36.1 C), temperature source Oral, height 5' 1.5" (1.562 m), weight 134 lb 3.2 oz (60.873 kg).  LABORATORY DATA: Lab Results  Component Value Date   WBC 9.2 07/03/2011   HGB 12.3* 07/03/2011   HCT 37.8* 07/03/2011   MCV 91.2 07/03/2011   PLT 257 07/03/2011      Chemistry      Component Value Date/Time   NA 144 06/15/2011 1338   K 4.3 06/15/2011 1338   CL 104 06/15/2011 1338   CO2 27 06/15/2011 1338   BUN 103* 06/15/2011 1338   CREATININE 3.36* 06/15/2011 1338      Component Value Date/Time   CALCIUM 9.3 06/15/2011 1338   ALKPHOS 121* 05/09/2011 1345   AST 14 05/09/2011 1345   ALT 19 05/09/2011 1345   BILITOT 0.6  05/09/2011 1345       RADIOGRAPHIC STUDIES: No results found.  ASSESSMENT: This is a very pleasant 78 years old white male with history of essential thrombocythemia currently on anagrelide 2.0 mg by mouth daily. The patient is tolerating his treatment fairly well with no significant adverse effects. His platelets count is in the normal range. He also has improvement in his hemoglobin and hematocrit.  PLAN: I recommended for him to continue on the current dose. I would see him back for followup visit in 3 months with repeat CBC and LDH. The patient was advised to call me immediately if he has any concerning symptoms in the interval.   All questions were answered. The patient knows to call the clinic with any problems, questions or concerns. We can certainly see the patient much sooner if necessary.

## 2011-07-03 NOTE — Telephone Encounter (Signed)
appts made and printed for pt aom °

## 2011-07-13 ENCOUNTER — Encounter (HOSPITAL_COMMUNITY)
Admission: RE | Admit: 2011-07-13 | Discharge: 2011-07-13 | Disposition: A | Discharge status: Home / Self Care | Payer: Medicare Other | Source: Ambulatory Visit | Attending: Nephrology | Admitting: Nephrology

## 2011-07-13 DIAGNOSIS — D638 Anemia in other chronic diseases classified elsewhere: Secondary | ICD-10-CM | POA: Insufficient documentation

## 2011-07-13 DIAGNOSIS — N184 Chronic kidney disease, stage 4 (severe): Secondary | ICD-10-CM | POA: Insufficient documentation

## 2011-07-13 LAB — FERRITIN: Ferritin: 1662 ng/mL — ABNORMAL HIGH (ref 22–322)

## 2011-07-13 LAB — IRON AND TIBC
Iron: 104 ug/dL (ref 42–135)
Saturation Ratios: 45 % (ref 20–55)
TIBC: 232 ug/dL (ref 215–435)
UIBC: 128 ug/dL (ref 125–400)

## 2011-07-13 LAB — RENAL FUNCTION PANEL
Albumin: 3.7 g/dL (ref 3.5–5.2)
Chloride: 98 mEq/L (ref 96–112)
Creatinine, Ser: 3.35 mg/dL — ABNORMAL HIGH (ref 0.50–1.35)
GFR calc non Af Amer: 15 mL/min — ABNORMAL LOW (ref >90)

## 2011-07-13 MED ORDER — EPOETIN ALFA 10000 UNIT/ML IJ SOLN: 10000.0000 [IU] | INTRAMUSCULAR | Status: DC

## 2011-07-17 ENCOUNTER — Telehealth: Payer: Self-pay | Admitting: Emergency Medicine

## 2011-07-17 NOTE — Telephone Encounter (Signed)
I spoke with the pt daughter and advised per result note RB will discuss results at OV on 08-01-11. I advised I can send message to Dr. Delton Coombes to see if he can give then results before then and she states not that is time to wait until OV. Carron Curie, CMA

## 2011-07-24 ENCOUNTER — Telehealth: Payer: Self-pay | Admitting: *Deleted

## 2011-07-24 MED ORDER — LIDOCAINE 5 % EX PTCH: 1.0000 | MEDICATED_PATCH | CUTANEOUS | Status: AC

## 2011-07-24 NOTE — Telephone Encounter (Signed)
Pt's daughter wanted Dr. Lovell Sheehan to know her father wants to try Lidocaine patches for his shingles pain.

## 2011-07-26 ENCOUNTER — Telehealth: Payer: Self-pay | Admitting: *Deleted

## 2011-07-26 DIAGNOSIS — L989 Disorder of the skin and subcutaneous tissue, unspecified: Secondary | ICD-10-CM

## 2011-07-26 NOTE — Telephone Encounter (Signed)
Pt's daughter Lupita Leash) is calling to let Dr. Lovell Sheehan know the lesion on pt's back is NOT healing, and needs the name of the surgeon he would like for him to see to have it excised??

## 2011-07-26 NOTE — Telephone Encounter (Signed)
To [place] [facility] per dr Lovell Sheehan- daughter informed

## 2011-07-27 ENCOUNTER — Encounter (HOSPITAL_COMMUNITY)
Admission: RE | Admit: 2011-07-27 | Discharge: 2011-07-27 | Disposition: A | Discharge status: Home / Self Care | Payer: Medicare Other | Source: Ambulatory Visit | Attending: Nephrology | Admitting: Nephrology

## 2011-07-27 LAB — POCT HEMOGLOBIN-HEMACUE: Hemoglobin: 12.3 g/dL — ABNORMAL LOW (ref 13.0–17.0)

## 2011-08-01 ENCOUNTER — Encounter: Payer: Self-pay | Admitting: Emergency Medicine

## 2011-08-01 ENCOUNTER — Ambulatory Visit (INDEPENDENT_AMBULATORY_CARE_PROVIDER_SITE_OTHER): Payer: Medicare Other | Admitting: Emergency Medicine

## 2011-08-01 VITALS — BP 182/82 | HR 71 | Temp 97.6°F | Ht 61.0 in | Wt 126.8 lb

## 2011-08-01 DIAGNOSIS — I2789 Other specified pulmonary heart diseases: Secondary | ICD-10-CM

## 2011-08-01 NOTE — Patient Instructions (Signed)
We will not restart Revatio at this time We will refer you to establish with Dr Eden Emms at [facility] cardiology Follow with Dr Delton Coombes in 6 months or sooner if you have any problems

## 2011-08-01 NOTE — Progress Notes (Signed)
History of Present Illness:  78 yo man, hx cardiomyopathy (believed non-ischemic), A Fib, thrombocytopenia (Mohamed), renal insuffiency Eliott Nine). Also with PAH likely due to his L sided disease, has been on sildenifil x 2 years (started by cardiology in [place]). No longer on coumadin. No hx known lung disease, although he did formerly smoke (10pk-yrs). Getting his Revatio direct from [employer] with assistance program.   ROV 06/22/11 -- Follows up for secondary PAH (NICM, A Fib, renal insuff). He sees Dr Eliott Nine, has had his diuretics increased due to increased LE edema over the last 3 months. May be more dyspneic with exertion. He has had some presyncope or wooziness when he takes tramadol. Never syncope. No HA.   ROV 08/01/11 -- secondary PAH (NICM, A Fib, renal insuff) on Revatio, started in [place]. Followed by Dr Eliott Nine for renal insuff. I stopped Revatio last vist as it was unclear that the medication was helping him. His daughters feel that he may be a bit better off the medication. Less exertional limitation. TTE 3/20 shows elevated PASP .   Filed Vitals:   08/01/11 1135  BP: 182/82  Pulse: 71  Temp: 97.6 F (36.4 C)  Gen: Pleasant, elderly man, in no distress,  normal affect  ENT: No lesions,  mouth clear,  oropharynx clear, no postnasal drip  Neck: No JVD, no TMG, no carotid bruits  Lungs: No use of accessory muscles, no dullness to percussion, clear without rales or rhonchi  Cardiovascular: RRR, heart sounds normal, no murmur or gallops, 1+ pretibial edema  Musculoskeletal: No deformities, no cyanosis or clubbing  Neuro: alert, non focal  Skin: Warm, no lesions or rashes  03/18/10 Ambulatory Pulse Oximetry  Resting;  HR__77___ 02 Sat__98% on room air___  Lap1 (185 feet) HR_92____ 02 Sat_98% on room air____  Lap2 (185 feet) HR_96____ 02 Sat__100% on room air___  Lap3 (185 feet) HR__101___ 02 Sat__98% on room air___  _x__Test Completed without Difficulty  ___Test Stopped due to:    By: Michel Bickers CMA   BMET    Component Value Date/Time   NA 141 07/13/2011 1301   K 4.2 07/13/2011 1301   CL 98 07/13/2011 1301   CO2 29 07/13/2011 1301   GLUCOSE 157* 07/13/2011 1301   BUN 117* 07/13/2011 1301   CREATININE 3.35* 07/13/2011 1301   CALCIUM 9.3 07/13/2011 1301   GFRNONAA 15* 07/13/2011 1301   GFRAA 18* 07/13/2011 1301    TTE 06/28/11:  Study Conclusions - Left ventricle: The cavity size was normal. Wall thickness was normal. Systolic function was normal. The estimated ejection fraction was in the range of 55% to 60%. Wall motion was normal; there were no regional wall motion abnormalities. Left ventricular diastolic function parameters were normal. - Left atrium: The atrium was moderately dilated. - Right ventricle: The cavity size was mildly dilated. - Right atrium: The atrium was moderately to severely dilated. - Tricuspid valve: Moderate regurgitation. - Pulmonary arteries: Systolic pressure was severely increased. PA peak pressure: 62mm Hg (S). - Impressions: PA pressure elevated by TR velocity    PULMONARY HYPERTENSION Likely due to his hx HTN, A Fib, NICM. His PASP was 62 on recent TTE. He is clinically well, does not desat on ambulation. I stopped Revatio because I am concerned about low efficacy and side effects at age 36. He has tolerated well and in fact may feel better. He does need to establish with a local cardiologist. I will refer him to see Dr Eden Emms.

## 2011-08-01 NOTE — Assessment & Plan Note (Signed)
Likely due to his hx HTN, A Fib, NICM. His PASP was 62 on recent TTE. He is clinically well, does not desat on ambulation. I stopped Revatio because I am concerned about low efficacy and side effects at age 78. He has tolerated well and in fact may feel better. He does need to establish with a local cardiologist. I will refer him to see Dr Eden Emms.

## 2011-08-03 ENCOUNTER — Telehealth: Payer: Self-pay | Admitting: Emergency Medicine

## 2011-08-03 NOTE — Telephone Encounter (Signed)
Received copies from Dr. Madelin Headings ,on  08/03/11. Forwarded 15 pages to Dr. Dillon Bjork review.

## 2011-08-10 ENCOUNTER — Telehealth: Payer: Self-pay | Admitting: *Deleted

## 2011-08-10 ENCOUNTER — Encounter (HOSPITAL_COMMUNITY)
Admission: RE | Admit: 2011-08-10 | Discharge: 2011-08-10 | Disposition: A | Discharge status: Home / Self Care | Payer: Medicare Other | Source: Ambulatory Visit | Attending: Nephrology | Admitting: Nephrology

## 2011-08-10 DIAGNOSIS — D638 Anemia in other chronic diseases classified elsewhere: Secondary | ICD-10-CM | POA: Insufficient documentation

## 2011-08-10 DIAGNOSIS — N184 Chronic kidney disease, stage 4 (severe): Secondary | ICD-10-CM | POA: Insufficient documentation

## 2011-08-10 LAB — FERRITIN: Ferritin: 1891 ng/mL — ABNORMAL HIGH (ref 22–322)

## 2011-08-10 LAB — RENAL FUNCTION PANEL
Albumin: 3.5 g/dL (ref 3.5–5.2)
BUN: 118 mg/dL — ABNORMAL HIGH (ref 6–23)
Chloride: 100 mEq/L (ref 96–112)
GFR calc Af Amer: 19 mL/min — ABNORMAL LOW (ref >90)
GFR calc non Af Amer: 16 mL/min — ABNORMAL LOW (ref >90)
Potassium: 4.2 mEq/L (ref 3.5–5.1)

## 2011-08-10 LAB — IRON AND TIBC: UIBC: 52 ug/dL — ABNORMAL LOW (ref 125–400)

## 2011-08-10 LAB — POCT HEMOGLOBIN-HEMACUE: Hemoglobin: 11.2 g/dL — ABNORMAL LOW (ref 13.0–17.0)

## 2011-08-10 MED ORDER — EPOETIN ALFA 10000 UNIT/ML IJ SOLN
10000.0000 [IU] | INTRAMUSCULAR | Status: DC
  Administered 2011-08-10: 10000 [IU] via SUBCUTANEOUS

## 2011-08-10 MED ORDER — EPOETIN ALFA 10000 UNIT/ML IJ SOLN
INTRAMUSCULAR | Status: AC
  Administered 2011-08-10: 10000 [IU] via SUBCUTANEOUS
  Filled 2011-08-10: qty 1

## 2011-08-10 NOTE — Telephone Encounter (Signed)
Ryan Arellano, pt's daughter wants to know if there is a stronger Lidocaine Patch that her Dad could use or if he could be changed to Fentanyl to help control his pain.?

## 2011-08-11 NOTE — Telephone Encounter (Signed)
Per dr Lovell Sheehan- too strong may cause him to fall -change lildodern patches to tid Left message on machine For donna

## 2011-08-14 ENCOUNTER — Encounter (INDEPENDENT_AMBULATORY_CARE_PROVIDER_SITE_OTHER): Payer: Self-pay | Admitting: Surgery

## 2011-08-14 ENCOUNTER — Ambulatory Visit (INDEPENDENT_AMBULATORY_CARE_PROVIDER_SITE_OTHER): Payer: Medicare Other | Admitting: Surgery

## 2011-08-14 ENCOUNTER — Ambulatory Visit: Payer: Medicare Other | Admitting: Internal Medicine

## 2011-08-14 VITALS — BP 146/58 | HR 72 | Temp 96.6°F | Resp 16 | Ht 62.0 in | Wt 130.5 lb

## 2011-08-14 DIAGNOSIS — L89109 Pressure ulcer of unspecified part of back, unspecified stage: Secondary | ICD-10-CM

## 2011-08-14 DIAGNOSIS — L89159 Pressure ulcer of sacral region, unspecified stage: Secondary | ICD-10-CM | POA: Insufficient documentation

## 2011-08-14 NOTE — Progress Notes (Signed)
Patient ID: Ryan Arellano, male   DOB: 07-22-1921, 78 y.o.   MRN: [id number]  Chief Complaint  Patient presents with  . New Evaluation    Skin lesion on back    HPI Ryan CUPPLES is a 78 y.o. male.   HPIPatient presents at the request of Dr. Lovell Sheehan for a wound over his tailbone area. It has been present for one year. It was a one point and draining. Topical antibiotic creams were applied. The area has persisted. He does have some mild discomfort when he sits on the area. There is no redness, drainage or bleeding from the area.  Past Medical History  Diagnosis Date  . Pulmonary hypertension   . Atrial fibrillation   . Thrombocytopenia   . Anemia   . Arthritis   . Diverticulitis   . Hypertension   . Renal insufficiency   . Skin lesion of back     Past Surgical History  Procedure Date  . Ruptured colon 1980  . S/p colectomy 1980  . Tonsillectomy   . Av fistula placement 2002    left arm    Family History  Problem Relation Age of Onset  . Heart disease Father   . Hypertension Father   . Heart disease Mother     Social History History  Substance Use Topics  . Smoking status: Former Smoker -- 0.5 packs/day for 40 years    Types: Cigarettes    Quit date: 04/11/1979  . Smokeless tobacco: Never Used  . Alcohol Use: Yes     once per week    No Known Allergies  Current Outpatient Prescriptions  Medication Sig Dispense Refill  . anagrelide (AGRYLIN) 0.5 MG capsule 0.5 mg 4 (four) times daily.       Marland Kitchen aspirin 81 MG tablet Take 81 mg by mouth daily.        . calcitRIOL (ROCALTROL) 0.25 MCG capsule Take 0.25 mcg by mouth. 1 on Monday ,wednesday, and friday      . calcium acetate (PHOSLO) 667 MG capsule Take 667 mg by mouth 3 (three) times daily with meals.      . carvedilol (COREG) 25 MG tablet Take 25 mg by mouth. 1.5 twice a day      . clindamycin (CLEOCIN-T) 1 % lotion Apply topically 2 (two) times daily.  60 mL  3  . cloNIDine (CATAPRES) 0.2 MG tablet Take 1  tablet (0.2 mg total) by mouth 3 (three) times daily.  90 tablet  11  . doxazosin (CARDURA) 1 MG tablet Take 1 tablet (1 mg total) by mouth at bedtime.  30 tablet  11  . esomeprazole (NEXIUM) 40 MG capsule Take 1 capsule (40 mg total) by mouth daily before breakfast.  90 capsule  3  . febuxostat (ULORIC) 40 MG tablet Take 40 mg by mouth daily.       . folic acid (FOLVITE) 1 MG tablet TAKE 1 TABLET TWICE A DAY  180 tablet  2  . furosemide (LASIX) 80 MG tablet Take 160 mg by mouth 2 (two) times daily.      . hydrALAZINE (APRESOLINE) 50 MG tablet Take 50 mg by mouth 2 (two) times daily.      Marland Kitchen lidocaine (LIDODERM) 5 % Place 1 patch onto the skin daily. Remove & Discard patch within 12 hours or as directed by MD  30 patch  3  . sodium bicarbonate 650 MG tablet Take 650 mg by mouth 3 (three) times daily.      Marland Kitchen  traMADol (ULTRAM) 50 MG tablet Take 200 mg by mouth every 6 (six) hours as needed.        Review of Systems Review of Systems  Constitutional: Positive for fatigue. Negative for unexpected weight change.  HENT: Negative.   Eyes: Negative.   Respiratory: Positive for shortness of breath.   Cardiovascular: Negative for chest pain and leg swelling.  Gastrointestinal: Negative.   Genitourinary: Negative.   Musculoskeletal: Negative.   Neurological: Negative.   Hematological: Negative.   Psychiatric/Behavioral: Negative.     Blood pressure 146/58, pulse 72, temperature 96.6 F (35.9 C), temperature source Temporal, resp. rate 16, height 5\' 2"  (1.575 m), weight 130 lb 8 oz (59.194 kg).  Physical Exam Physical Exam  Constitutional: He is oriented to person, place, and time. He appears well-developed and well-nourished.  HENT:  Head: Normocephalic and atraumatic.  Eyes: EOM are normal. Pupils are equal, round, and reactive to light.  Neck: Normal range of motion. Neck supple.  Neurological: He is alert and oriented to person, place, and time.  Skin:     Psychiatric: He has a normal  mood and affect. His behavior is normal. Judgment and thought content normal.    Data Reviewed Dr Lovell Sheehan notes  Assessment    Pressure  Ulcer / wound coccyx     Plan    Follow for now .  This looks like an old decubitus wound that has healed.  Area does not heal well after excision and given his risk factors I favor observation.  Return in 3 months for a recheck.  Call if area ulcerates or drains.  D/W family today.       Milaina Sher A. 08/14/2011, 3:27 PM

## 2011-08-14 NOTE — Patient Instructions (Signed)
Return 3 months.  Call if area bleeds or opens.

## 2011-08-18 ENCOUNTER — Encounter: Payer: Self-pay | Admitting: Cardiovascular Disease

## 2011-08-18 ENCOUNTER — Ambulatory Visit (INDEPENDENT_AMBULATORY_CARE_PROVIDER_SITE_OTHER): Payer: Medicare Other | Admitting: Cardiovascular Disease

## 2011-08-18 VITALS — BP 205/81 | HR 58 | Wt 132.0 lb

## 2011-08-18 DIAGNOSIS — I2789 Other specified pulmonary heart diseases: Secondary | ICD-10-CM

## 2011-08-18 DIAGNOSIS — I10 Essential (primary) hypertension: Secondary | ICD-10-CM

## 2011-08-18 DIAGNOSIS — I4891 Unspecified atrial fibrillation: Secondary | ICD-10-CM

## 2011-08-18 DIAGNOSIS — N186 End stage renal disease: Secondary | ICD-10-CM

## 2011-08-18 NOTE — Assessment & Plan Note (Signed)
There is no evidence that his pulmonary hypertension is secondary to left sided disease.  EF is normal and he is in NSR.  Revatio no clinical benefit.  Loud apical murmur.  Reviewed his echo and MR is only trivial.  Must be referred continuous murmur from fistula.  No further cardiac w/u needed

## 2011-08-18 NOTE — Assessment & Plan Note (Signed)
See [facility] to have dialysis even at his age.  Fistula is mature at [age] years old.

## 2011-08-18 NOTE — Assessment & Plan Note (Signed)
Well controlled.  Continue current medications and low sodium Dash type diet.    

## 2011-08-18 NOTE — Patient Instructions (Signed)
Your physician recommends that you schedule a follow-up appointment in: AS NEEDED  Your physician recommends that you continue on your current medications as directed. Please refer to the Current Medication list given to you today.  

## 2011-08-18 NOTE — Assessment & Plan Note (Signed)
Main NSR no coumadin indicated

## 2011-08-18 NOTE — Progress Notes (Signed)
Patient ID: Ryan Arellano, male   DOB: 05/29/1921, 78 y.o.   MRN: [id number] 78 yo man, hx cardiomyopathy (believed non-ischemic), A Fib, thrombocytopenia (Mohamed), renal insuffiency Eliott Nine). Also with PAH PA pressure low 60's by echo.   Has been on REVATIO  In past but stopped  (started by cardiology in [place]). No longer on coumadin. No hx known lung disease, although he did formerly smoke (10pk-yrs).  Dr Kavin Leech note indicates secondary pulmonary hypertension but I see no primary cardiac issues with normal EF by echo and patient is in NSR at this time.  Unfortunatley his Cr is in the 3.0 range which likely exacerbates volume status.  Recently saw Dr Luisa Hart for ducubitus ulcer and observation recommended  ROS: Denies fever, malais, weight loss, blurry vision, decreased visual acuity, cough, sputum, SOB, hemoptysis, pleuritic pain, palpitaitons, heartburn, abdominal pain, melena, lower extremity edema, claudication, or rash.  All other systems reviewed and negative   General: Affect appropriate Healthy:  appears stated age HEENT: normal Neck supple with no adenopathy JVP normal no bruits no thyromegaly Lungs clear with no wheezing and good diaphragmatic motion Heart:  S1/S2 loud apical murmur from fistula no,rub, gallop or click PMI normal Abdomen: benighn, BS positve, no tenderness, no AAA no bruit.  No HSM or HJR Distal pulses intact with no bruits No edema Neuro non-focal Skin warm and dry No muscular weakness Fistula in LUE  Medications Current Outpatient Prescriptions  Medication Sig Dispense Refill  . anagrelide (AGRYLIN) 0.5 MG capsule 0.5 mg 4 (four) times daily.       Marland Kitchen aspirin 81 MG tablet Take 81 mg by mouth daily.        . calcitRIOL (ROCALTROL) 0.25 MCG capsule Take 0.25 mcg by mouth. 1 on Monday ,wednesday, and friday      . calcium acetate (PHOSLO) 667 MG capsule Take 667 mg by mouth 3 (three) times daily with meals.      . carvedilol (COREG) 25 MG tablet Take 25  mg by mouth. 1.5 twice a day      . clindamycin (CLEOCIN-T) 1 % lotion Apply topically 2 (two) times daily.  60 mL  3  . cloNIDine (CATAPRES) 0.2 MG tablet Take 1 tablet (0.2 mg total) by mouth 3 (three) times daily.  90 tablet  11  . doxazosin (CARDURA) 1 MG tablet Take 1 tablet (1 mg total) by mouth at bedtime.  30 tablet  11  . esomeprazole (NEXIUM) 40 MG capsule Take 1 capsule (40 mg total) by mouth daily before breakfast.  90 capsule  3  . febuxostat (ULORIC) 40 MG tablet Take 40 mg by mouth daily.       . folic acid (FOLVITE) 1 MG tablet TAKE 1 TABLET TWICE A DAY  180 tablet  2  . furosemide (LASIX) 80 MG tablet Take 160 mg by mouth 2 (two) times daily.      . hydrALAZINE (APRESOLINE) 50 MG tablet Take 50 mg by mouth 2 (two) times daily.      Marland Kitchen lidocaine (LIDODERM) 5 % Place 1 patch onto the skin daily. Remove & Discard patch within 12 hours or as directed by MD  30 patch  3  . sodium bicarbonate 650 MG tablet Take 650 mg by mouth 3 (three) times daily.      . traMADol (ULTRAM) 50 MG tablet Take 200 mg by mouth every 6 (six) hours as needed.        Allergies Review of patient's allergies indicates no known  allergies.  Family History: Family History  Problem Relation Age of Onset  . Heart disease Father   . Hypertension Father   . Heart disease Mother     Social History: History   Social History  . Marital Status: Widowed    Spouse Name: N/A    Number of Children: N/A  . Years of Education: N/A   Occupational History  . retired    Social History Main Topics  . Smoking status: Former Smoker -- 0.5 packs/day for 40 years    Types: Cigarettes    Quit date: 04/11/1979  . Smokeless tobacco: Never Used  . Alcohol Use: Yes     once per week  . Drug Use: No  . Sexually Active: No   Other Topics Concern  . Not on file   Social History Narrative  . No narrative on file    Electrocardiogram: 09/28/10  NSR normal ECG  Assessment and Plan

## 2011-09-07 ENCOUNTER — Encounter (HOSPITAL_COMMUNITY)
Admission: RE | Admit: 2011-09-07 | Discharge: 2011-09-07 | Disposition: A | Discharge status: Home / Self Care | Payer: Medicare Other | Source: Ambulatory Visit | Attending: Nephrology | Admitting: Nephrology

## 2011-09-07 MED ORDER — CLONIDINE HCL 0.1 MG PO TABS
0.1000 mg | ORAL_TABLET | Freq: Once | ORAL | Status: AC | PRN
  Administered 2011-09-07: 0.1 mg via ORAL
  Filled 2011-09-07: qty 1

## 2011-09-07 MED ORDER — EPOETIN ALFA 10000 UNIT/ML IJ SOLN: 10000.0000 [IU] | INTRAMUSCULAR | Status: DC

## 2011-09-19 ENCOUNTER — Encounter (INDEPENDENT_AMBULATORY_CARE_PROVIDER_SITE_OTHER): Payer: Self-pay | Admitting: Surgery

## 2011-09-21 ENCOUNTER — Encounter (HOSPITAL_COMMUNITY)
Admission: RE | Admit: 2011-09-21 | Discharge: 2011-09-21 | Disposition: A | Discharge status: Home / Self Care | Payer: Medicare Other | Source: Ambulatory Visit | Attending: Nephrology | Admitting: Nephrology

## 2011-09-21 DIAGNOSIS — N184 Chronic kidney disease, stage 4 (severe): Secondary | ICD-10-CM | POA: Insufficient documentation

## 2011-09-21 DIAGNOSIS — D638 Anemia in other chronic diseases classified elsewhere: Secondary | ICD-10-CM | POA: Insufficient documentation

## 2011-09-21 LAB — RENAL FUNCTION PANEL
Albumin: 3.7 g/dL (ref 3.5–5.2)
Calcium: 9.2 mg/dL (ref 8.4–10.5)
Creatinine, Ser: 4.01 mg/dL — ABNORMAL HIGH (ref 0.50–1.35)
GFR calc non Af Amer: 12 mL/min — ABNORMAL LOW (ref >90)
Phosphorus: 4.9 mg/dL — ABNORMAL HIGH (ref 2.3–4.6)

## 2011-09-21 MED ORDER — EPOETIN ALFA 10000 UNIT/ML IJ SOLN: 10000.0000 [IU] | INTRAMUSCULAR | Status: DC

## 2011-09-22 LAB — IRON AND TIBC
Iron: 87 ug/dL (ref 42–135)
Saturation Ratios: 35 % (ref 20–55)
TIBC: 252 ug/dL (ref 215–435)
UIBC: 165 ug/dL (ref 125–400)

## 2011-09-22 LAB — FERRITIN: Ferritin: 2344 ng/mL — ABNORMAL HIGH (ref 22–322)

## 2011-09-27 ENCOUNTER — Telehealth: Payer: Self-pay | Admitting: Internal Medicine

## 2011-09-27 NOTE — Telephone Encounter (Signed)
daughter aware that appt has been moved to 7/3 from 6/24  aom

## 2011-10-02 ENCOUNTER — Other Ambulatory Visit: Payer: Medicare Other | Admitting: Lab

## 2011-10-02 ENCOUNTER — Ambulatory Visit: Payer: Medicare Other | Admitting: Internal Medicine

## 2011-10-03 ENCOUNTER — Ambulatory Visit: Payer: Medicare Other | Admitting: Internal Medicine

## 2011-10-11 ENCOUNTER — Ambulatory Visit (HOSPITAL_BASED_OUTPATIENT_CLINIC_OR_DEPARTMENT_OTHER): Payer: Medicare Other | Admitting: Internal Medicine

## 2011-10-11 ENCOUNTER — Telehealth: Payer: Self-pay | Admitting: Internal Medicine

## 2011-10-11 ENCOUNTER — Other Ambulatory Visit (HOSPITAL_BASED_OUTPATIENT_CLINIC_OR_DEPARTMENT_OTHER): Payer: Medicare Other | Admitting: Lab

## 2011-10-11 VITALS — BP 167/78 | HR 58 | Temp 97.0°F | Ht 62.0 in | Wt 137.0 lb

## 2011-10-11 DIAGNOSIS — D473 Essential (hemorrhagic) thrombocythemia: Secondary | ICD-10-CM

## 2011-10-11 LAB — COMPREHENSIVE METABOLIC PANEL
ALT: 15 U/L (ref 0–53)
Alkaline Phosphatase: 116 U/L (ref 39–117)
Creatinine, Ser: 4.26 mg/dL — ABNORMAL HIGH (ref 0.50–1.35)
Glucose, Bld: 107 mg/dL — ABNORMAL HIGH (ref 70–99)
Sodium: 137 mEq/L (ref 135–145)
Total Bilirubin: 0.3 mg/dL (ref 0.3–1.2)
Total Protein: 7.1 g/dL (ref 6.0–8.3)

## 2011-10-11 LAB — CBC WITH DIFFERENTIAL/PLATELET
BASO%: 1 % (ref 0.0–2.0)
LYMPH%: 15.4 % (ref 14.0–49.0)
MCHC: 32.4 g/dL (ref 32.0–36.0)
MCV: 92.3 fL (ref 79.3–98.0)
MONO%: 6.8 % (ref 0.0–14.0)
NEUT%: 74.2 % (ref 39.0–75.0)
Platelets: 305 10*3/uL (ref 140–400)
RBC: 3.2 10*6/uL — ABNORMAL LOW (ref 4.20–5.82)

## 2011-10-11 NOTE — Progress Notes (Signed)
Ryan Arellano [phone]   [phone]  OFFICE PROGRESS NOTE  Ryan Mew, MD [address] [place] [address]  DIAGNOSIS: Essential thrombocythemia   CURRENT THERAPY: Anagrelide 2.0 mg by mouth daily.   INTERVAL HISTORY: Ryan Arellano 78 y.o. male returns to the clinic today for three-month followup visit accompanied by his daughter. The patient is feeling fine today with no specific complaints. He denied having any significant weight loss or night sweats. No bleeding issues. He is tolerating his treatment with anagrelide fairly well. He has repeat CBC performed earlier today and he is here for evaluation and discussion of his lab results.  MEDICAL HISTORY: Past Medical History  Diagnosis Date  . Pulmonary hypertension   . Campath-induced atrial fibrillation   . Thrombocytopenia   . Anemia   . Arthritis   . Diverticulitis   . Hypertension   . Renal insufficiency   . Skin lesion of back     ALLERGIES:   has no known allergies.  MEDICATIONS:  Current Outpatient Prescriptions  Medication Sig Dispense Refill  . anagrelide (AGRYLIN) 0.5 MG capsule 0.5 mg 4 (four) times daily.       Marland Kitchen aspirin 81 MG tablet Take 81 mg by mouth daily.        . calcitRIOL (ROCALTROL) 0.25 MCG capsule Take 0.25 mcg by mouth. 1 on Monday ,wednesday, and friday      . calcium acetate (PHOSLO) 667 MG capsule Take 667 mg by mouth 3 (three) times daily with meals.      . carvedilol (COREG) 25 MG tablet 1.5 twice a day      . cloNIDine (CATAPRES) 0.2 MG tablet Take 1 tablet (0.2 mg total) by mouth 3 (three) times daily.  90 tablet  11  . doxazosin (CARDURA) 1 MG tablet Take 1 tablet (1 mg total) by mouth at bedtime.  30 tablet  11  . esomeprazole (NEXIUM) 40 MG capsule Take 1 capsule (40 mg total) by mouth daily before breakfast.  90 capsule  3  . febuxostat (ULORIC) 40 MG tablet Take 40 mg by mouth daily.       . folic acid (FOLVITE) 1 MG  tablet TAKE 1 TABLET TWICE A DAY  180 tablet  2  . furosemide (LASIX) 80 MG tablet Take 160 mg by mouth 2 (two) times daily.      . hydrALAZINE (APRESOLINE) 50 MG tablet Take 50 mg by mouth 2 (two) times daily.      . sodium bicarbonate 650 MG tablet Take 650 mg by mouth 3 (three) times daily.      . traMADol (ULTRAM) 50 MG tablet Take 200 mg by mouth every 6 (six) hours as needed.      . vitamin C (ASCORBIC ACID) 250 MG tablet Take 250 mg by mouth daily.        SURGICAL HISTORY:  Past Surgical History  Procedure Date  . Ruptured colon 1980  . S/p colectomy 1980  . Tonsillectomy   . Av fistula placement 2002    left arm    REVIEW OF SYSTEMS:  A comprehensive review of systems was negative.   PHYSICAL EXAMINATION: General appearance: alert, cooperative and no distress Neck: no adenopathy Lymph nodes: Cervical, supraclavicular, and axillary nodes normal. Resp: clear to auscultation bilaterally Cardio: regular rate and rhythm, S1, S2 normal, no murmur, click, rub or gallop GI: soft, non-tender; bowel sounds normal; no masses,  no organomegaly Extremities: extremities normal,  atraumatic, no cyanosis or edema Neurologic: Alert and oriented X 3, normal strength and tone. Normal symmetric reflexes. Normal coordination and gait  ECOG PERFORMANCE STATUS: 1 - Symptomatic but completely ambulatory  Blood pressure 167/78, pulse 58, temperature 97 F (36.1 C), height 5\' 2"  (1.575 m), weight 137 lb (62.143 kg).  LABORATORY DATA: Lab Results  Component Value Date   WBC 8.6 10/11/2011   HGB 9.6* 10/11/2011   HCT 29.5* 10/11/2011   MCV 92.3 10/11/2011   PLT 305 10/11/2011      Chemistry      Component Value Date/Time   NA 136 09/21/2011 1301   K 3.9 09/21/2011 1301   CL 96 09/21/2011 1301   CO2 20 09/21/2011 1301   BUN 118* 09/21/2011 1301   CREATININE 4.01* 09/21/2011 1301      Component Value Date/Time   CALCIUM 9.2 09/21/2011 1301   ALKPHOS 164* 07/03/2011 1312   AST 19 07/03/2011 1312   ALT  17 07/03/2011 1312   BILITOT 0.7 07/03/2011 1312       RADIOGRAPHIC STUDIES: No results found.  ASSESSMENT: This is a very pleasant 78 years old white male with history of essential thrombocythemia currently on treatment with anagrelide. He is tolerating it fine with normal platelets count.  PLAN: I recommended for the patient to continue on his current dose of anagrelide. I would see him back for followup visit in 3 months with repeat CBC, comprehensive metabolic panel and LDH. He was advised to call me immediately if he has any concerning symptoms and interval. For the anemia, he is currently on treatment with Procrit under the care of his nephrologist.  All questions were answered. The patient knows to call the clinic with any problems, questions or concerns. We can certainly see the patient much sooner if necessary.

## 2011-10-11 NOTE — Telephone Encounter (Signed)
gve the pt her oct 2013 appt calendar °

## 2011-10-19 ENCOUNTER — Encounter (HOSPITAL_COMMUNITY)
Admission: RE | Admit: 2011-10-19 | Discharge: 2011-10-19 | Disposition: A | Discharge status: Home / Self Care | Payer: Medicare Other | Source: Ambulatory Visit | Attending: Nephrology | Admitting: Nephrology

## 2011-10-19 DIAGNOSIS — D638 Anemia in other chronic diseases classified elsewhere: Secondary | ICD-10-CM | POA: Insufficient documentation

## 2011-10-19 DIAGNOSIS — N184 Chronic kidney disease, stage 4 (severe): Secondary | ICD-10-CM | POA: Insufficient documentation

## 2011-10-19 LAB — RENAL FUNCTION PANEL
CO2: 26 mEq/L (ref 19–32)
Calcium: 8.9 mg/dL (ref 8.4–10.5)
GFR calc Af Amer: 12 mL/min — ABNORMAL LOW (ref >90)
Glucose, Bld: 153 mg/dL — ABNORMAL HIGH (ref 70–99)
Sodium: 141 mEq/L (ref 135–145)

## 2011-10-19 MED ORDER — EPOETIN ALFA 10000 UNIT/ML IJ SOLN: 10000.0000 [IU] | INTRAMUSCULAR | Status: DC

## 2011-10-19 MED ORDER — EPOETIN ALFA 10000 UNIT/ML IJ SOLN
INTRAMUSCULAR | Status: AC
  Filled 2011-10-19: qty 1

## 2011-10-19 MED ORDER — EPOETIN ALFA 10000 UNIT/ML IJ SOLN
INTRAMUSCULAR | Status: AC
  Administered 2011-10-19: 10000 [IU] via SUBCUTANEOUS
  Filled 2011-10-19: qty 1

## 2011-10-20 LAB — IRON AND TIBC
Iron: 121 ug/dL (ref 42–135)
TIBC: 245 ug/dL (ref 215–435)

## 2011-11-02 ENCOUNTER — Encounter (HOSPITAL_COMMUNITY)
Admission: RE | Admit: 2011-11-02 | Discharge: 2011-11-02 | Disposition: A | Discharge status: Home / Self Care | Payer: Medicare Other | Source: Ambulatory Visit | Attending: Nephrology | Admitting: Nephrology

## 2011-11-02 LAB — POCT HEMOGLOBIN-HEMACUE: Hemoglobin: 10.8 g/dL — ABNORMAL LOW (ref 13.0–17.0)

## 2011-11-02 MED ORDER — EPOETIN ALFA 10000 UNIT/ML IJ SOLN
INTRAMUSCULAR | Status: AC
  Administered 2011-11-02: 10000 [IU] via SUBCUTANEOUS
  Filled 2011-11-02: qty 1

## 2011-11-07 ENCOUNTER — Ambulatory Visit (INDEPENDENT_AMBULATORY_CARE_PROVIDER_SITE_OTHER): Payer: Medicare Other | Admitting: Internal Medicine

## 2011-11-07 ENCOUNTER — Encounter: Payer: Self-pay | Admitting: Internal Medicine

## 2011-11-07 VITALS — BP 140/80 | HR 72 | Temp 98.6°F | Resp 16 | Ht 65.0 in | Wt 130.0 lb

## 2011-11-07 DIAGNOSIS — I2789 Other specified pulmonary heart diseases: Secondary | ICD-10-CM

## 2011-11-07 DIAGNOSIS — D473 Essential (hemorrhagic) thrombocythemia: Secondary | ICD-10-CM

## 2011-11-07 DIAGNOSIS — N186 End stage renal disease: Secondary | ICD-10-CM

## 2011-11-07 DIAGNOSIS — N19 Unspecified kidney failure: Secondary | ICD-10-CM

## 2011-11-07 DIAGNOSIS — I4891 Unspecified atrial fibrillation: Secondary | ICD-10-CM

## 2011-11-07 NOTE — Patient Instructions (Addendum)
Take an extra in addition to the 3 year normally take one with each meal of the PhosLo (calcium acetate) on Monday Wednesday Friday

## 2011-11-07 NOTE — Progress Notes (Signed)
Subjective:    Patient ID: Ryan Arellano, male    DOB: [birth year], 78 y.o.   MRN: [id number]  HPI this is an 78 year old male who is followed for chronic renal failure hypertension history of congestive heart failure and anemia.  He has no symptomology of congestive heart failure at this time appears to be relatively well fluid balance if not on the dry side.  We discussed drinking a couple glasses of water a day to decrease the BUN and help with the increased phosphorus level we have suggested that he take the calcium acetate an extra tablet on Monday Wednesday and Friday.  Blood pressure stable his current medications he is breathing well    Review of Systems  Constitutional: Positive for fatigue. Negative for fever.  HENT: Positive for congestion. Negative for hearing loss, neck pain and postnasal drip.   Eyes: Negative for discharge, redness and visual disturbance.  Respiratory: Positive for shortness of breath. Negative for cough and wheezing.   Cardiovascular: Negative for leg swelling.  Gastrointestinal: Negative for abdominal pain, constipation and abdominal distention.  Genitourinary: Negative for urgency and frequency.  Musculoskeletal: Positive for myalgias and joint swelling. Negative for arthralgias.  Skin: Negative for color change and rash.  Neurological: Negative for weakness and light-headedness.  Hematological: Negative for adenopathy.  Psychiatric/Behavioral: Negative for behavioral problems.   Past Medical History  Diagnosis Date  . Pulmonary hypertension   . Atrial fibrillation   . Thrombocytopenia   . Anemia   . Arthritis   . Diverticulitis   . Hypertension   . Renal insufficiency   . Skin lesion of back     History   Social History  . Marital Status: Widowed    Spouse Name: N/A    Number of Children: N/A  . Years of Education: N/A   Occupational History  . retired    Social History Main Topics  . Smoking status: Former Smoker -- 0.5  packs/day for 40 years    Types: Cigarettes    Quit date: 04/11/1979  . Smokeless tobacco: Never Used  . Alcohol Use: Yes     once per week  . Drug Use: No  . Sexually Active: No   Other Topics Concern  . Not on file   Social History Narrative  . No narrative on file    Past Surgical History  Procedure Date  . Ruptured colon 1980  . S/p colectomy 1980  . Tonsillectomy   . Av fistula placement 2002    left arm    Family History  Problem Relation Age of Onset  . Heart disease Father   . Hypertension Father   . Heart disease Mother     No Known Allergies  Current Outpatient Prescriptions on File Prior to Visit  Medication Sig Dispense Refill  . anagrelide (AGRYLIN) 0.5 MG capsule 0.5 mg 4 (four) times daily.       Marland Kitchen aspirin 81 MG tablet Take 81 mg by mouth daily.        . calcitRIOL (ROCALTROL) 0.25 MCG capsule Take 0.25 mcg by mouth. 1 on Monday ,wednesday, and friday      . calcium acetate (PHOSLO) 667 MG capsule Take 667 mg by mouth 3 (three) times daily with meals.      . carvedilol (COREG) 25 MG tablet 1.5 twice a day      . doxazosin (CARDURA) 1 MG tablet Take 1 tablet (1 mg total) by mouth at bedtime.  30 tablet  11  .  esomeprazole (NEXIUM) 40 MG capsule Take 1 capsule (40 mg total) by mouth daily before breakfast.  90 capsule  3  . febuxostat (ULORIC) 40 MG tablet Take 40 mg by mouth daily.       . folic acid (FOLVITE) 1 MG tablet TAKE 1 TABLET TWICE A DAY  180 tablet  2  . furosemide (LASIX) 80 MG tablet Take 160 mg by mouth 2 (two) times daily.      . hydrALAZINE (APRESOLINE) 50 MG tablet Take 50 mg by mouth 2 (two) times daily.      . sodium bicarbonate 650 MG tablet Take 650 mg by mouth 3 (three) times daily.      . traMADol (ULTRAM) 50 MG tablet Take 200 mg by mouth every 6 (six) hours as needed.      . vitamin C (ASCORBIC ACID) 250 MG tablet Take 250 mg by mouth daily.      Marland Kitchen DISCONTD: cloNIDine (CATAPRES) 0.2 MG tablet Take 1 tablet (0.2 mg total) by  mouth 3 (three) times daily.  90 tablet  11    BP 140/80  Pulse 72  Temp 98.6 F (37 C)  Resp 16  Ht 5\' 5"  (1.651 m)  Wt 130 lb (58.968 kg)  BMI 21.63 kg/m2       Objective:   Physical Exam  Nursing note and vitals reviewed. Constitutional: He appears well-developed and well-nourished.  HENT:  Head: Normocephalic and atraumatic.  Eyes: Conjunctivae are normal. Pupils are equal, round, and reactive to light.  Neck: Normal range of motion. Neck supple.  Cardiovascular: Normal rate and regular rhythm.   Murmur heard. Pulmonary/Chest: Effort normal and breath sounds normal.  Abdominal: Soft. Bowel sounds are normal.          Assessment & Plan:  Slight increase in the calcium acetate to maintain a lower phosphorus level.  Blood pressure stable his current medications no evidence of CHF a slight increase in free water to aid in decreasing BUN.  Otherwise he appears stable his current medications no changes are warranted at this time monitoring in 3 months

## 2011-11-15 ENCOUNTER — Other Ambulatory Visit (HOSPITAL_COMMUNITY): Payer: Self-pay | Admitting: *Deleted

## 2011-11-16 ENCOUNTER — Encounter (HOSPITAL_COMMUNITY)
Admission: RE | Admit: 2011-11-16 | Discharge: 2011-11-16 | Disposition: A | Discharge status: Home / Self Care | Payer: Medicare Other | Source: Ambulatory Visit | Attending: Nephrology | Admitting: Nephrology

## 2011-11-16 DIAGNOSIS — N184 Chronic kidney disease, stage 4 (severe): Secondary | ICD-10-CM | POA: Insufficient documentation

## 2011-11-16 DIAGNOSIS — D638 Anemia in other chronic diseases classified elsewhere: Secondary | ICD-10-CM | POA: Insufficient documentation

## 2011-11-16 LAB — RENAL FUNCTION PANEL
BUN: 96 mg/dL — ABNORMAL HIGH (ref 6–23)
CO2: 25 mEq/L (ref 19–32)
Calcium: 9.6 mg/dL (ref 8.4–10.5)
Creatinine, Ser: 4.14 mg/dL — ABNORMAL HIGH (ref 0.50–1.35)
GFR calc non Af Amer: 12 mL/min — ABNORMAL LOW (ref >90)
Glucose, Bld: 94 mg/dL (ref 70–99)

## 2011-11-16 LAB — IRON AND TIBC
Iron: 49 ug/dL (ref 42–135)
TIBC: 279 ug/dL (ref 215–435)

## 2011-11-16 MED ORDER — EPOETIN ALFA 10000 UNIT/ML IJ SOLN: 10000.0000 [IU] | INTRAMUSCULAR | Status: DC

## 2011-11-16 MED ORDER — CLONIDINE HCL 0.1 MG PO TABS
0.1000 mg | ORAL_TABLET | Freq: Once | ORAL | Status: AC | PRN
  Administered 2011-11-16: 0.1 mg via ORAL

## 2011-11-16 MED ORDER — CLONIDINE HCL 0.1 MG PO TABS
ORAL_TABLET | ORAL | Status: AC
  Filled 2011-11-16: qty 1

## 2011-11-16 MED ORDER — HYDRALAZINE HCL 50 MG PO TABS
50.0000 mg | ORAL_TABLET | Freq: Once | ORAL | Status: DC
  Filled 2011-11-16 (×2): qty 1

## 2011-11-17 LAB — FERRITIN: Ferritin: 1770 ng/mL — ABNORMAL HIGH (ref 22–322)

## 2011-11-19 ENCOUNTER — Other Ambulatory Visit: Payer: Self-pay | Admitting: Internal Medicine

## 2011-11-29 ENCOUNTER — Encounter (INDEPENDENT_AMBULATORY_CARE_PROVIDER_SITE_OTHER): Payer: Self-pay | Admitting: Surgery

## 2011-11-29 ENCOUNTER — Ambulatory Visit (INDEPENDENT_AMBULATORY_CARE_PROVIDER_SITE_OTHER): Payer: Medicare Other | Admitting: Surgery

## 2011-11-29 VITALS — BP 128/56 | HR 68 | Temp 97.2°F | Resp 16 | Ht 65.0 in | Wt 130.6 lb

## 2011-11-29 DIAGNOSIS — S31000A Unspecified open wound of lower back and pelvis without penetration into retroperitoneum, initial encounter: Secondary | ICD-10-CM

## 2011-11-29 DIAGNOSIS — T07XXXA Unspecified multiple injuries, initial encounter: Secondary | ICD-10-CM

## 2011-11-29 NOTE — Progress Notes (Signed)
Patient ID: Ryan Arellano, male   DOB: 09-Jun-1921, 78 y.o.   MRN: [id number]  Chief Complaint  Patient presents with  . Follow-up    reck    HPI Ryan Arellano is a 78 y.o. male.   HPIPatient presents in follow up  for a wound over his tailbone area. It is not draining. Topical antibiotic creams were applied. The area has persisted. He does have some mild discomfort when he sits on the area. There is no redness, drainage or bleeding from the area.  Past Medical History  Diagnosis Date  . Pulmonary hypertension   . Atrial fibrillation   . Thrombocytopenia   . Anemia   . Arthritis   . Diverticulitis   . Hypertension   . Renal insufficiency   . Skin lesion of back     Past Surgical History  Procedure Date  . Ruptured colon 1980  . S/p colectomy 1980  . Tonsillectomy   . Av fistula placement 2002    left arm    Family History  Problem Relation Age of Onset  . Heart disease Father   . Hypertension Father   . Heart disease Mother     Social History History  Substance Use Topics  . Smoking status: Former Smoker -- 0.5 packs/day for 40 years    Types: Cigarettes    Quit date: 04/11/1979  . Smokeless tobacco: Never Used  . Alcohol Use: Yes     once per week    No Known Allergies  Current Outpatient Prescriptions  Medication Sig Dispense Refill  . anagrelide (AGRYLIN) 0.5 MG capsule 0.5 mg 4 (four) times daily.       Marland Kitchen aspirin 81 MG tablet Take 81 mg by mouth daily.        . calcitRIOL (ROCALTROL) 0.25 MCG capsule Take 0.25 mcg by mouth. 1 on Monday ,wednesday, and friday      . calcium acetate (PHOSLO) 667 MG capsule Take 667 mg by mouth 3 (three) times daily with meals.      . carvedilol (COREG) 25 MG tablet 1.5 twice a day      . cloNIDine (CATAPRES) 0.2 MG tablet TAKE 1 TABLET 3 TIMES A DAY  90 tablet  6  . doxazosin (CARDURA) 1 MG tablet Take 1 tablet (1 mg total) by mouth at bedtime.  30 tablet  11  . esomeprazole (NEXIUM) 40 MG capsule Take 1 capsule (40  mg total) by mouth daily before breakfast.  90 capsule  3  . febuxostat (ULORIC) 40 MG tablet Take 40 mg by mouth daily.       . folic acid (FOLVITE) 1 MG tablet TAKE 1 TABLET TWICE A DAY  180 tablet  2  . furosemide (LASIX) 80 MG tablet Take 160 mg by mouth 2 (two) times daily.      . hydrALAZINE (APRESOLINE) 50 MG tablet Take 50 mg by mouth 2 (two) times daily.      Marland Kitchen LIDODERM 5 %       . sodium bicarbonate 650 MG tablet Take 650 mg by mouth 3 (three) times daily.      . traMADol (ULTRAM) 50 MG tablet Take 200 mg by mouth every 6 (six) hours as needed.      . vitamin C (ASCORBIC ACID) 250 MG tablet Take 250 mg by mouth daily.        Review of Systems Review of Systems  Constitutional: Positive for fatigue. Negative for unexpected weight change.  HENT: Negative.  Eyes: Negative.   Respiratory: Positive for shortness of breath.   Cardiovascular: Negative for chest pain and leg swelling.  Gastrointestinal: Negative.   Genitourinary: Negative.   Musculoskeletal: Negative.   Neurological: Negative.   Hematological: Negative.   Psychiatric/Behavioral: Negative.     Blood pressure 128/56, pulse 68, temperature 97.2 F (36.2 C), temperature source Temporal, resp. rate 16, height 5\' 5"  (1.651 m), weight 130 lb 9.6 oz (59.24 kg).  Physical Exam Physical Exam  Constitutional: He is oriented to person, place, and time. He appears well-developed and well-nourished.  HENT:  Head: Normocephalic and atraumatic.  Eyes: EOM are normal. Pupils are equal, round, and reactive to light.  Neck: Normal range of motion. Neck supple.  Neurological: He is alert and oriented to person, place, and time.  Skin: sacral wound healed with scar    Psychiatric: He has a normal mood and affect. His behavior is normal. Judgment and thought content normal.    Data Reviewed Dr Lovell Sheehan notes  Assessment    Resolved pressure ulcer to coccyx region    Plan      Return as needed.    Takina Busser  A. 11/29/2011, 12:27 PM

## 2011-11-29 NOTE — Patient Instructions (Signed)
Return as needed.  The area is healed.  Small scar noted

## 2011-11-30 ENCOUNTER — Encounter (HOSPITAL_COMMUNITY)
Admission: RE | Admit: 2011-11-30 | Discharge: 2011-11-30 | Disposition: A | Discharge status: Home / Self Care | Payer: Medicare Other | Source: Ambulatory Visit | Attending: Nephrology | Admitting: Nephrology

## 2011-12-14 ENCOUNTER — Encounter (HOSPITAL_COMMUNITY)
Admission: RE | Admit: 2011-12-14 | Discharge: 2011-12-14 | Disposition: A | Discharge status: Home / Self Care | Payer: Medicare Other | Source: Ambulatory Visit | Attending: Nephrology | Admitting: Nephrology

## 2011-12-14 DIAGNOSIS — N184 Chronic kidney disease, stage 4 (severe): Secondary | ICD-10-CM | POA: Insufficient documentation

## 2011-12-14 DIAGNOSIS — D638 Anemia in other chronic diseases classified elsewhere: Secondary | ICD-10-CM | POA: Insufficient documentation

## 2011-12-14 LAB — RENAL FUNCTION PANEL
Calcium: 8.9 mg/dL (ref 8.4–10.5)
GFR calc Af Amer: 13 mL/min — ABNORMAL LOW (ref >90)
GFR calc non Af Amer: 12 mL/min — ABNORMAL LOW (ref >90)
Phosphorus: 5 mg/dL — ABNORMAL HIGH (ref 2.3–4.6)
Sodium: 140 mEq/L (ref 135–145)

## 2011-12-14 LAB — FERRITIN: Ferritin: 1467 ng/mL — ABNORMAL HIGH (ref 22–322)

## 2011-12-14 LAB — IRON AND TIBC
Saturation Ratios: 53 % (ref 20–55)
TIBC: 228 ug/dL (ref 215–435)

## 2011-12-14 MED ORDER — EPOETIN ALFA 10000 UNIT/ML IJ SOLN
10000.0000 [IU] | INTRAMUSCULAR | Status: DC
  Administered 2011-12-14: 10000 [IU] via SUBCUTANEOUS

## 2011-12-14 MED ORDER — EPOETIN ALFA 10000 UNIT/ML IJ SOLN
INTRAMUSCULAR | Status: AC
  Administered 2011-12-14: 10000 [IU] via SUBCUTANEOUS
  Filled 2011-12-14: qty 1

## 2011-12-28 ENCOUNTER — Encounter (HOSPITAL_COMMUNITY)
Admission: RE | Admit: 2011-12-28 | Discharge: 2011-12-28 | Disposition: A | Discharge status: Home / Self Care | Payer: Medicare Other | Source: Ambulatory Visit | Attending: Nephrology | Admitting: Nephrology

## 2012-01-11 ENCOUNTER — Encounter (HOSPITAL_COMMUNITY)
Admission: RE | Admit: 2012-01-11 | Discharge: 2012-01-11 | Disposition: A | Discharge status: Home / Self Care | Payer: Medicare Other | Source: Ambulatory Visit | Attending: Nephrology | Admitting: Nephrology

## 2012-01-11 DIAGNOSIS — D638 Anemia in other chronic diseases classified elsewhere: Secondary | ICD-10-CM | POA: Insufficient documentation

## 2012-01-11 DIAGNOSIS — N184 Chronic kidney disease, stage 4 (severe): Secondary | ICD-10-CM | POA: Insufficient documentation

## 2012-01-11 MED ORDER — CLONIDINE HCL 0.1 MG PO TABS
0.1000 mg | ORAL_TABLET | Freq: Once | ORAL | Status: AC | PRN
  Administered 2012-01-11: 0.1 mg via ORAL

## 2012-01-11 MED ORDER — EPOETIN ALFA 10000 UNIT/ML IJ SOLN: 10000.0000 [IU] | INTRAMUSCULAR | Status: DC

## 2012-01-11 MED ORDER — CLONIDINE HCL 0.1 MG PO TABS
ORAL_TABLET | ORAL | Status: AC
  Filled 2012-01-11: qty 1

## 2012-01-11 NOTE — Progress Notes (Signed)
Spoke with Dr. Eliott Nine about patient's elevated BP.  BP continued to be elevated after dose of clonidine.  MD ordered patient to reschedule.

## 2012-01-16 ENCOUNTER — Other Ambulatory Visit (HOSPITAL_BASED_OUTPATIENT_CLINIC_OR_DEPARTMENT_OTHER): Payer: Medicare Other | Admitting: Lab

## 2012-01-16 ENCOUNTER — Telehealth: Payer: Self-pay | Admitting: Internal Medicine

## 2012-01-16 ENCOUNTER — Ambulatory Visit (HOSPITAL_BASED_OUTPATIENT_CLINIC_OR_DEPARTMENT_OTHER): Payer: Medicare Other | Admitting: Internal Medicine

## 2012-01-16 VITALS — BP 145/64 | HR 69 | Temp 97.0°F | Resp 20 | Ht 65.0 in | Wt 133.0 lb

## 2012-01-16 DIAGNOSIS — D473 Essential (hemorrhagic) thrombocythemia: Secondary | ICD-10-CM

## 2012-01-16 LAB — CBC WITH DIFFERENTIAL/PLATELET
Basophils Absolute: 0.1 10*3/uL (ref 0.0–0.1)
Eosinophils Absolute: 0.1 10*3/uL (ref 0.0–0.5)
HGB: 11.9 g/dL — ABNORMAL LOW (ref 13.0–17.1)
MCV: 91.7 fL (ref 79.3–98.0)
NEUT#: 8.4 10*3/uL — ABNORMAL HIGH (ref 1.5–6.5)
RDW: 14.5 % (ref 11.0–14.6)
lymph#: 1.1 10*3/uL (ref 0.9–3.3)

## 2012-01-16 LAB — COMPREHENSIVE METABOLIC PANEL (CC13)
Albumin: 3.5 g/dL (ref 3.5–5.0)
BUN: 110 mg/dL — ABNORMAL HIGH (ref 7.0–26.0)
Calcium: 8.7 mg/dL (ref 8.4–10.4)
Chloride: 106 mEq/L (ref 98–107)
Glucose: 138 mg/dl — ABNORMAL HIGH (ref 70–99)
Potassium: 4.6 mEq/L (ref 3.5–5.1)

## 2012-01-16 NOTE — Telephone Encounter (Signed)
gv and printed JAN appt for pt

## 2012-01-16 NOTE — Patient Instructions (Signed)
CBC today showed normal platelets count. Continue on anagrelide with the same dose. Followup in 3 months.

## 2012-01-16 NOTE — Progress Notes (Signed)
[facility] Telephone:(336) [phone]   Fax:([phone]  OFFICE PROGRESS NOTE  [name], MD [address] [place] [address]  DIAGNOSIS: Essential thrombocythemia   CURRENT THERAPY: Anagrelide 2.0 mg by mouth daily.  INTERVAL HISTORY: Ryan Arellano 78 y.o. male returns to the clinic today for followup visit accompanied by his daughter. The patient is feeling fine today with no specific complaints except for the neuropathic pain from his previous history of herpes zoster located mainly in the back. He denied having any significant weight loss or night sweats. He denied having any bleeding issues. The patient is tolerating his treatment with anagrelide fairly well with no significant adverse effects. He has repeat CBC performed earlier today and is here for evaluation and discussion of his lab results.  MEDICAL HISTORY: Past Medical History  Diagnosis Date  . Pulmonary hypertension   . Atrial fibrillation   . Thrombocytopenia   . Anemia   . Arthritis   . Diverticulitis   . Hypertension   . Renal insufficiency   . Skin lesion of back     ALLERGIES:   has no known allergies.  MEDICATIONS:  Current Outpatient Prescriptions  Medication Sig Dispense Refill  . anagrelide (AGRYLIN) 0.5 MG capsule 0.5 mg 4 (four) times daily.       Marland Kitchen aspirin 81 MG tablet Take 81 mg by mouth daily.        . calcitRIOL (ROCALTROL) 0.25 MCG capsule Take 0.25 mcg by mouth. 1 on Monday ,wednesday, and friday      . calcium acetate (PHOSLO) 667 MG capsule Take 667 mg by mouth 3 (three) times daily with meals.      . carvedilol (COREG) 25 MG tablet 1.5 twice a day      . cloNIDine (CATAPRES) 0.2 MG tablet TAKE 1 TABLET 3 TIMES A DAY  90 tablet  6  . doxazosin (CARDURA) 1 MG tablet Take 1 tablet (1 mg total) by mouth at bedtime.  30 tablet  11  . esomeprazole (NEXIUM) 40 MG capsule Take 1 capsule (40 mg total) by mouth daily before breakfast.  90 capsule  3  .  febuxostat (ULORIC) 40 MG tablet Take 40 mg by mouth daily.       . folic acid (FOLVITE) 1 MG tablet TAKE 1 TABLET TWICE A DAY  180 tablet  2  . furosemide (LASIX) 80 MG tablet Take 160 mg by mouth 2 (two) times daily.      . hydrALAZINE (APRESOLINE) 50 MG tablet Take 100 mg by mouth 2 (two) times daily.       Marland Kitchen LIDODERM 5 %       . sodium bicarbonate 650 MG tablet Take 650 mg by mouth 3 (three) times daily.      . vitamin C (ASCORBIC ACID) 250 MG tablet Take 250 mg by mouth daily.      . traMADol (ULTRAM) 50 MG tablet Take 200 mg by mouth every 6 (six) hours as needed.        SURGICAL HISTORY:  Past Surgical History  Procedure Date  . Ruptured colon 1980  . S/p colectomy 1980  . Tonsillectomy   . Av fistula placement 2002    left arm    REVIEW OF SYSTEMS:  A comprehensive review of systems was negative except for: Constitutional: positive for fatigue Neurological: positive for paresthesia   PHYSICAL EXAMINATION: General appearance: alert, cooperative and no distress Neck: no adenopathy Lymph nodes: Cervical,  supraclavicular, and axillary nodes normal. Resp: clear to auscultation bilaterally Cardio: regular rate and rhythm, S1, S2 normal, no murmur, click, rub or gallop GI: soft, non-tender; bowel sounds normal; no masses,  no organomegaly Extremities: extremities normal, atraumatic, no cyanosis or edema  ECOG PERFORMANCE STATUS: 1 - Symptomatic but completely ambulatory  Blood pressure 145/64, pulse 69, temperature 97 F (36.1 C), temperature source Oral, resp. rate 20, height 5\' 5"  (1.651 m), weight 133 lb (60.328 kg).  LABORATORY DATA: Lab Results  Component Value Date   WBC 10.5* 01/16/2012   HGB 11.9* 01/16/2012   HCT 36.4* 01/16/2012   MCV 91.7 01/16/2012   PLT 333 01/16/2012      Chemistry      Component Value Date/Time   NA 140 12/14/2011 1250   K 4.5 12/14/2011 1250   CL 100 12/14/2011 1250   CO2 26 12/14/2011 1250   BUN 94* 12/14/2011 1250   CREATININE 4.16* 12/14/2011  1250      Component Value Date/Time   CALCIUM 8.9 12/14/2011 1250   ALKPHOS 116 10/11/2011 1506   AST 18 10/11/2011 1506   ALT 15 10/11/2011 1506   BILITOT 0.3 10/11/2011 1506       RADIOGRAPHIC STUDIES: No results found.  ASSESSMENT: This is a very pleasant 78 years old white male with history of essential thrombocythemia currently on treatment with anagrelide 0.2 mg by mouth daily. The patient is tolerating his treatment fairly well with no significant adverse effects and his platelets count are maintained within the normal range.   PLAN: I discussed the lab result with the patient and his daughter. I recommended for him to continue on observation for now with repeat CBC, comprehensive metabolic panel and LDH in 3 months. The patient would come back for followup visit at that time.  He was advised to call me immediately if he has any concerning symptoms in the interval.   All questions were answered. The patient knows to call the clinic with any problems, questions or concerns. We can certainly see the patient much sooner if necessary.

## 2012-01-18 ENCOUNTER — Encounter (HOSPITAL_COMMUNITY)
Admission: RE | Admit: 2012-01-18 | Discharge: 2012-01-18 | Disposition: A | Discharge status: Home / Self Care | Payer: Medicare Other | Source: Ambulatory Visit | Attending: Nephrology | Admitting: Nephrology

## 2012-01-18 LAB — POCT HEMOGLOBIN-HEMACUE: Hemoglobin: 11.4 g/dL — ABNORMAL LOW (ref 13.0–17.0)

## 2012-01-18 MED ORDER — EPOETIN ALFA 10000 UNIT/ML IJ SOLN
INTRAMUSCULAR | Status: AC
  Administered 2012-01-18: 10000 [IU] via SUBCUTANEOUS
  Filled 2012-01-18: qty 1

## 2012-01-18 MED ORDER — EPOETIN ALFA 10000 UNIT/ML IJ SOLN
10000.0000 [IU] | INTRAMUSCULAR | Status: DC
  Administered 2012-01-18: 10000 [IU] via SUBCUTANEOUS

## 2012-01-30 ENCOUNTER — Ambulatory Visit: Payer: Medicare Other | Admitting: Emergency Medicine

## 2012-01-30 ENCOUNTER — Other Ambulatory Visit (HOSPITAL_COMMUNITY): Payer: Self-pay | Admitting: *Deleted

## 2012-02-01 ENCOUNTER — Encounter (HOSPITAL_COMMUNITY)
Admission: RE | Admit: 2012-02-01 | Discharge: 2012-02-01 | Disposition: A | Discharge status: Home / Self Care | Payer: Medicare Other | Source: Ambulatory Visit | Attending: Nephrology | Admitting: Nephrology

## 2012-02-01 LAB — POCT HEMOGLOBIN-HEMACUE: Hemoglobin: 12.2 g/dL — ABNORMAL LOW (ref 13.0–17.0)

## 2012-02-01 MED ORDER — EPOETIN ALFA 10000 UNIT/ML IJ SOLN: 10000.0000 [IU] | INTRAMUSCULAR | Status: DC

## 2012-02-06 ENCOUNTER — Encounter: Payer: Self-pay | Admitting: Internal Medicine

## 2012-02-06 ENCOUNTER — Ambulatory Visit (INDEPENDENT_AMBULATORY_CARE_PROVIDER_SITE_OTHER): Payer: Medicare Other | Admitting: Internal Medicine

## 2012-02-06 VITALS — BP 140/60 | HR 72 | Temp 98.2°F | Resp 16 | Ht 65.0 in | Wt 133.0 lb

## 2012-02-06 DIAGNOSIS — I10 Essential (primary) hypertension: Secondary | ICD-10-CM

## 2012-02-06 DIAGNOSIS — Z23 Encounter for immunization: Secondary | ICD-10-CM

## 2012-02-06 DIAGNOSIS — I2789 Other specified pulmonary heart diseases: Secondary | ICD-10-CM

## 2012-02-06 DIAGNOSIS — N19 Unspecified kidney failure: Secondary | ICD-10-CM

## 2012-02-06 DIAGNOSIS — M549 Dorsalgia, unspecified: Secondary | ICD-10-CM

## 2012-02-06 DIAGNOSIS — I4891 Unspecified atrial fibrillation: Secondary | ICD-10-CM

## 2012-02-06 DIAGNOSIS — G8929 Other chronic pain: Secondary | ICD-10-CM

## 2012-02-06 MED ORDER — LIDOCAINE 5 % EX PTCH: 3.0000 | MEDICATED_PATCH | CUTANEOUS | Status: DC

## 2012-02-06 NOTE — Progress Notes (Signed)
Subjective:    Patient ID: Ryan Arellano, male    DOB: [birth year], 78 y.o.   MRN: [id number]  HPI Renal function slowly worsening and dialysis may be soon. Has a "renal" ash appearance Blood pressure stable Fibrillation stable General he did reasonably well and functional a stable level Has a lipoma on back    Review of Systems  Constitutional: Negative for fever and fatigue.  HENT: Negative for hearing loss, congestion, neck pain and postnasal drip.   Eyes: Negative for discharge, redness and visual disturbance.  Respiratory: Negative for cough, shortness of breath and wheezing.   Cardiovascular: Negative for leg swelling.  Gastrointestinal: Negative for abdominal pain, constipation and abdominal distention.  Genitourinary: Negative for urgency and frequency.  Musculoskeletal: Negative for joint swelling and arthralgias.  Skin: Negative for color change and rash.  Neurological: Negative for weakness and light-headedness.  Hematological: Negative for adenopathy.  Psychiatric/Behavioral: Negative for behavioral problems.   Past Medical History  Diagnosis Date  . Pulmonary hypertension   . Atrial fibrillation   . Thrombocytopenia   . Anemia   . Arthritis   . Diverticulitis   . Hypertension   . Renal insufficiency   . Skin lesion of back     History   Social History  . Marital Status: Widowed    Spouse Name: N/A    Number of Children: N/A  . Years of Education: N/A   Occupational History  . retired    Social History Main Topics  . Smoking status: Former Smoker -- 0.5 packs/day for 40 years    Types: Cigarettes    Quit date: 04/11/1979  . Smokeless tobacco: Never Used  . Alcohol Use: Yes     once per week  . Drug Use: No  . Sexually Active: No   Other Topics Concern  . Not on file   Social History Narrative  . No narrative on file    Past Surgical History  Procedure Date  . Ruptured colon 1980  . S/p colectomy 1980  . Tonsillectomy   . Av fistula  placement 2002    left arm    Family History  Problem Relation Age of Onset  . Heart disease Father   . Hypertension Father   . Heart disease Mother     No Known Allergies  Current Outpatient Prescriptions on File Prior to Visit  Medication Sig Dispense Refill  . anagrelide (AGRYLIN) 0.5 MG capsule 0.5 mg 4 (four) times daily.       Marland Kitchen aspirin 81 MG tablet Take 81 mg by mouth daily.        . calcitRIOL (ROCALTROL) 0.25 MCG capsule Take 0.25 mcg by mouth. 1 on Monday ,wednesday, and friday      . calcium acetate (PHOSLO) 667 MG capsule Take 667 mg by mouth 3 (three) times daily with meals.      . carvedilol (COREG) 25 MG tablet 1.5 twice a day      . cloNIDine (CATAPRES) 0.2 MG tablet TAKE 1 TABLET 3 TIMES A DAY  90 tablet  6  . doxazosin (CARDURA) 1 MG tablet Take 1 tablet (1 mg total) by mouth at bedtime.  30 tablet  11  . esomeprazole (NEXIUM) 40 MG capsule Take 1 capsule (40 mg total) by mouth daily before breakfast.  90 capsule  3  . febuxostat (ULORIC) 40 MG tablet Take 40 mg by mouth daily.       . folic acid (FOLVITE) 1 MG tablet TAKE 1 TABLET  TWICE A DAY  180 tablet  2  . furosemide (LASIX) 80 MG tablet Take 160 mg by mouth 2 (two) times daily.      . hydrALAZINE (APRESOLINE) 50 MG tablet Take 100 mg by mouth 2 (two) times daily.       . sodium bicarbonate 650 MG tablet Take 650 mg by mouth 3 (three) times daily.      . traMADol (ULTRAM) 50 MG tablet Take 200 mg by mouth every 6 (six) hours as needed.      . vitamin C (ASCORBIC ACID) 250 MG tablet Take 250 mg by mouth daily.        BP 140/60  Pulse 72  Temp 98.2 F (36.8 C)  Resp 16  Ht 5\' 5"  (1.651 m)  Wt 133 lb (60.328 kg)  BMI 22.13 kg/m2       Objective:   Physical Exam  Nursing note and vitals reviewed. Constitutional: He appears well-developed and well-nourished.  HENT:  Head: Normocephalic and atraumatic.  Eyes: Conjunctivae normal are normal. Pupils are equal, round, and reactive to light.  Neck:  Normal range of motion. Neck supple.  Cardiovascular: Normal rate and regular rhythm.   Pulmonary/Chest: Effort normal and breath sounds normal.  Abdominal: Soft. Bowel sounds are normal.          Assessment & Plan:  Progressive renal failure HTN stable AF stable Dialysis soon discussed with family  I have spent more than 30 minutes examining this patient face-to-face of which over half was spent in counseling

## 2012-02-06 NOTE — Patient Instructions (Addendum)
The patient is instructed to continue all medications as prescribed. Schedule followup with check out clerk upon leaving the clinic  

## 2012-02-13 ENCOUNTER — Other Ambulatory Visit: Payer: Self-pay | Admitting: Internal Medicine

## 2012-02-14 ENCOUNTER — Other Ambulatory Visit (HOSPITAL_COMMUNITY): Payer: Self-pay | Admitting: *Deleted

## 2012-02-15 ENCOUNTER — Encounter (HOSPITAL_COMMUNITY)
Admission: RE | Admit: 2012-02-15 | Discharge: 2012-02-15 | Disposition: A | Discharge status: Home / Self Care | Payer: Medicare Other | Source: Ambulatory Visit | Attending: Nephrology | Admitting: Nephrology

## 2012-02-15 DIAGNOSIS — D638 Anemia in other chronic diseases classified elsewhere: Secondary | ICD-10-CM | POA: Insufficient documentation

## 2012-02-15 DIAGNOSIS — N184 Chronic kidney disease, stage 4 (severe): Secondary | ICD-10-CM | POA: Insufficient documentation

## 2012-02-15 LAB — FERRITIN: Ferritin: 1866 ng/mL — ABNORMAL HIGH (ref 22–322)

## 2012-02-15 LAB — RENAL FUNCTION PANEL
Albumin: 3.5 g/dL (ref 3.5–5.2)
GFR calc Af Amer: 12 mL/min — ABNORMAL LOW (ref >90)
GFR calc non Af Amer: 10 mL/min — ABNORMAL LOW (ref >90)
Phosphorus: 5.7 mg/dL — ABNORMAL HIGH (ref 2.3–4.6)
Potassium: 3.7 mEq/L (ref 3.5–5.1)
Sodium: 139 mEq/L (ref 135–145)

## 2012-02-15 LAB — IRON AND TIBC
Saturation Ratios: 47 % (ref 20–55)
UIBC: 124 ug/dL — ABNORMAL LOW (ref 125–400)

## 2012-02-15 MED ORDER — EPOETIN ALFA 10000 UNIT/ML IJ SOLN
10000.0000 [IU] | INTRAMUSCULAR | Status: DC
  Administered 2012-02-15: 10000 [IU] via SUBCUTANEOUS

## 2012-02-15 MED ORDER — EPOETIN ALFA 10000 UNIT/ML IJ SOLN
INTRAMUSCULAR | Status: AC
  Filled 2012-02-15: qty 1

## 2012-02-16 LAB — PTH, INTACT AND CALCIUM: PTH: 222.5 pg/mL — ABNORMAL HIGH (ref 14.0–72.0)

## 2012-02-22 ENCOUNTER — Other Ambulatory Visit: Payer: Self-pay | Admitting: Internal Medicine

## 2012-02-29 ENCOUNTER — Encounter (HOSPITAL_COMMUNITY)
Admission: RE | Admit: 2012-02-29 | Discharge: 2012-02-29 | Disposition: A | Discharge status: Home / Self Care | Payer: Medicare Other | Source: Ambulatory Visit | Attending: Nephrology | Admitting: Nephrology

## 2012-02-29 MED ORDER — EPOETIN ALFA 10000 UNIT/ML IJ SOLN
INTRAMUSCULAR | Status: AC
  Administered 2012-02-29: 10000 [IU]
  Filled 2012-02-29: qty 1

## 2012-03-14 ENCOUNTER — Encounter (HOSPITAL_COMMUNITY)
Admission: RE | Admit: 2012-03-14 | Discharge: 2012-03-14 | Disposition: A | Discharge status: Home / Self Care | Payer: Medicare Other | Source: Ambulatory Visit | Attending: Nephrology | Admitting: Nephrology

## 2012-03-14 DIAGNOSIS — D638 Anemia in other chronic diseases classified elsewhere: Secondary | ICD-10-CM | POA: Insufficient documentation

## 2012-03-14 DIAGNOSIS — N184 Chronic kidney disease, stage 4 (severe): Secondary | ICD-10-CM | POA: Insufficient documentation

## 2012-03-14 LAB — IRON AND TIBC
Iron: 66 ug/dL (ref 42–135)
UIBC: 217 ug/dL (ref 125–400)

## 2012-03-14 LAB — RENAL FUNCTION PANEL
Albumin: 3.4 g/dL — ABNORMAL LOW (ref 3.5–5.2)
Chloride: 99 mEq/L (ref 96–112)
Creatinine, Ser: 4.4 mg/dL — ABNORMAL HIGH (ref 0.50–1.35)
GFR calc non Af Amer: 11 mL/min — ABNORMAL LOW (ref >90)
Potassium: 4 mEq/L (ref 3.5–5.1)
Sodium: 140 mEq/L (ref 135–145)

## 2012-03-14 LAB — POCT HEMOGLOBIN-HEMACUE: Hemoglobin: 11.3 g/dL — ABNORMAL LOW (ref 13.0–17.0)

## 2012-03-14 MED ORDER — EPOETIN ALFA 10000 UNIT/ML IJ SOLN
INTRAMUSCULAR | Status: AC
  Filled 2012-03-14: qty 1

## 2012-03-14 MED ORDER — EPOETIN ALFA 10000 UNIT/ML IJ SOLN
10000.0000 [IU] | INTRAMUSCULAR | Status: DC
  Administered 2012-03-14: 10000 [IU] via SUBCUTANEOUS

## 2012-03-15 LAB — FERRITIN: Ferritin: 965 ng/mL — ABNORMAL HIGH (ref 22–322)

## 2012-03-21 ENCOUNTER — Other Ambulatory Visit: Payer: Self-pay | Admitting: Internal Medicine

## 2012-03-28 ENCOUNTER — Encounter (HOSPITAL_COMMUNITY)
Admission: RE | Admit: 2012-03-28 | Discharge: 2012-03-28 | Disposition: A | Discharge status: Home / Self Care | Payer: Medicare Other | Source: Ambulatory Visit | Attending: Nephrology | Admitting: Nephrology

## 2012-03-28 MED ORDER — EPOETIN ALFA 10000 UNIT/ML IJ SOLN
INTRAMUSCULAR | Status: AC
  Administered 2012-03-28: 10000 [IU] via SUBCUTANEOUS
  Filled 2012-03-28: qty 1

## 2012-04-05 ENCOUNTER — Telehealth: Payer: Self-pay | Admitting: General Practice

## 2012-04-05 NOTE — Telephone Encounter (Signed)
CALLED AND L/M TO CHANGE APPT FROM 1/8 TO 1/13,DR MKM ON CALL     Thurston Hole

## 2012-04-12 ENCOUNTER — Encounter (HOSPITAL_COMMUNITY)
Admission: RE | Admit: 2012-04-12 | Discharge: 2012-04-12 | Disposition: A | Discharge status: Home / Self Care | Payer: Medicare Other | Source: Ambulatory Visit | Attending: Nephrology | Admitting: Nephrology

## 2012-04-12 DIAGNOSIS — D638 Anemia in other chronic diseases classified elsewhere: Secondary | ICD-10-CM | POA: Insufficient documentation

## 2012-04-12 DIAGNOSIS — N184 Chronic kidney disease, stage 4 (severe): Secondary | ICD-10-CM | POA: Insufficient documentation

## 2012-04-12 LAB — FERRITIN: Ferritin: 1059 ng/mL — ABNORMAL HIGH (ref 22–322)

## 2012-04-12 LAB — RENAL FUNCTION PANEL
Calcium: 8.4 mg/dL (ref 8.4–10.5)
Creatinine, Ser: 3.91 mg/dL — ABNORMAL HIGH (ref 0.50–1.35)
GFR calc Af Amer: 14 mL/min — ABNORMAL LOW (ref >90)
Glucose, Bld: 138 mg/dL — ABNORMAL HIGH (ref 70–99)
Phosphorus: 5.4 mg/dL — ABNORMAL HIGH (ref 2.3–4.6)
Sodium: 143 mEq/L (ref 135–145)

## 2012-04-12 LAB — IRON AND TIBC
Iron: 76 ug/dL (ref 42–135)
Saturation Ratios: 32 % (ref 20–55)
TIBC: 236 ug/dL (ref 215–435)

## 2012-04-12 MED ORDER — EPOETIN ALFA 10000 UNIT/ML IJ SOLN: 10000.0000 [IU] | INTRAMUSCULAR | Status: DC

## 2012-04-17 ENCOUNTER — Ambulatory Visit: Payer: Medicare Other | Admitting: Internal Medicine

## 2012-04-17 ENCOUNTER — Other Ambulatory Visit: Payer: Medicare Other | Admitting: Lab

## 2012-04-22 ENCOUNTER — Ambulatory Visit (HOSPITAL_BASED_OUTPATIENT_CLINIC_OR_DEPARTMENT_OTHER): Payer: Medicare Other | Admitting: Internal Medicine

## 2012-04-22 ENCOUNTER — Encounter: Payer: Self-pay | Admitting: Internal Medicine

## 2012-04-22 ENCOUNTER — Other Ambulatory Visit (HOSPITAL_BASED_OUTPATIENT_CLINIC_OR_DEPARTMENT_OTHER): Payer: Medicare Other | Admitting: Lab

## 2012-04-22 ENCOUNTER — Telehealth: Payer: Self-pay | Admitting: Internal Medicine

## 2012-04-22 VITALS — BP 182/65 | HR 67 | Temp 97.1°F | Resp 20 | Ht 65.0 in | Wt 136.1 lb

## 2012-04-22 DIAGNOSIS — D473 Essential (hemorrhagic) thrombocythemia: Secondary | ICD-10-CM

## 2012-04-22 LAB — COMPREHENSIVE METABOLIC PANEL (CC13)
ALT: 13 U/L (ref 0–55)
AST: 18 U/L (ref 5–34)
Albumin: 3.4 g/dL — ABNORMAL LOW (ref 3.5–5.0)
Calcium: 8.7 mg/dL (ref 8.4–10.4)
Chloride: 108 mEq/L — ABNORMAL HIGH (ref 98–107)
Potassium: 4.2 mEq/L (ref 3.5–5.1)
Sodium: 141 mEq/L (ref 136–145)
Total Protein: 7.5 g/dL (ref 6.4–8.3)

## 2012-04-22 LAB — CBC WITH DIFFERENTIAL/PLATELET
BASO%: 0.6 % (ref 0.0–2.0)
HCT: 38.7 % (ref 38.4–49.9)
HGB: 12.2 g/dL — ABNORMAL LOW (ref 13.0–17.1)
MCHC: 31.5 g/dL — ABNORMAL LOW (ref 32.0–36.0)
MONO#: 0.5 10*3/uL (ref 0.1–0.9)
Platelets: 514 10*3/uL — ABNORMAL HIGH (ref 140–400)
RBC: 4.2 10*6/uL (ref 4.20–5.82)
lymph#: 1.4 10*3/uL (ref 0.9–3.3)
nRBC: 0 % (ref 0–0)

## 2012-04-22 NOTE — Progress Notes (Signed)
[facility] Telephone:([phone]   Fax:(336) [phone]  OFFICE PROGRESS NOTE  [name], MD [address] [place] [address]  DIAGNOSIS: Essential thrombocythemia   CURRENT THERAPY: Anagrelide 2.0 mg by mouth daily.  INTERVAL HISTORY: Ryan Arellano 79 y.o. male returns to the clinic today for routine followup visit accompanied his daughter. The patient is feeling fine today with no specific complaints. He is followed by nephrology for his renal insufficiency. The patient is also currently on treatment with Procrit for anemia of chronic disease by nephrology. He is currently on anagrelide 2.0 mg by mouth daily and tolerating it fairly well. He had repeat CBC performed earlier today and he is here for evaluation and discussion of his lab results. He denied having any significant weight loss or night sweats. He denied having any significant chest pain, shortness breath, cough or hemoptysis. He has no bleeding issues.  MEDICAL HISTORY: Past Medical History  Diagnosis Date  . Pulmonary hypertension   . Atrial fibrillation   . Thrombocytopenia   . Anemia   . Arthritis   . Diverticulitis   . Hypertension   . Renal insufficiency   . Skin lesion of back     ALLERGIES:   has no known allergies.  MEDICATIONS:  Current Outpatient Prescriptions  Medication Sig Dispense Refill  . anagrelide (AGRYLIN) 0.5 MG capsule 0.5 mg 4 (four) times daily.       Marland Kitchen aspirin 81 MG tablet Take 81 mg by mouth daily.        . calcitRIOL (ROCALTROL) 0.25 MCG capsule Take 0.25 mcg by mouth. 1 on Monday ,wednesday, and friday      . calcium acetate (PHOSLO) 667 MG capsule Take 667 mg by mouth 3 (three) times daily with meals.      . carvedilol (COREG) 25 MG tablet 1.5 twice a day      . cloNIDine (CATAPRES) 0.2 MG tablet TAKE 1 TABLET 3 TIMES A DAY  90 tablet  6  . doxazosin (CARDURA) 1 MG tablet Take 1 tablet (1 mg total) by mouth at bedtime.  30 tablet  11  .  esomeprazole (NEXIUM) 40 MG capsule Take 1 capsule (40 mg total) by mouth daily before breakfast.  90 capsule  3  . febuxostat (ULORIC) 40 MG tablet Take 40 mg by mouth daily.       . folic acid (FOLVITE) 1 MG tablet TAKE 1 TABLET TWICE A DAY  180 tablet  2  . furosemide (LASIX) 80 MG tablet Take 160 mg by mouth 2 (two) times daily.      . hydrALAZINE (APRESOLINE) 50 MG tablet Take 100 mg by mouth 2 (two) times daily.       Marland Kitchen lidocaine (LIDODERM) 5 % Place 3 patches onto the skin daily.  90 patch  4  . NEXIUM 40 MG capsule TAKE ONE CAPSULE EVERY DAY  90 capsule  1  . sodium bicarbonate 650 MG tablet Take 650 mg by mouth 3 (three) times daily.      . traMADol (ULTRAM) 50 MG tablet Take 200 mg by mouth every 6 (six) hours as needed.      . traMADol (ULTRAM) 50 MG tablet TAKE 1 TABLET BY MOUTH EVERY 6 HOURS AS NEEDED FOR PAIN.  100 tablet  2  . vitamin C (ASCORBIC ACID) 250 MG tablet Take 250 mg by mouth daily.        SURGICAL HISTORY:  Past Surgical History  Procedure Date  . Ruptured colon 1980  . S/p colectomy 1980  . Tonsillectomy   . Av fistula placement 2002    left arm    REVIEW OF SYSTEMS:  A comprehensive review of systems was negative except for: Constitutional: positive for fatigue   PHYSICAL EXAMINATION: General appearance: alert, cooperative and no distress Head: Normocephalic, without obvious abnormality, atraumatic Neck: no adenopathy Lymph nodes: Cervical, supraclavicular, and axillary nodes normal. Resp: clear to auscultation bilaterally Cardio: regular rate and rhythm, S1, S2 normal, no murmur, click, rub or gallop GI: soft, non-tender; bowel sounds normal; no masses,  no organomegaly Extremities: extremities normal, atraumatic, no cyanosis or edema  ECOG PERFORMANCE STATUS: 1 - Symptomatic but completely ambulatory  There were no vitals taken for this visit.  LABORATORY DATA: Lab Results  Component Value Date   WBC 8.1 04/22/2012   HGB 12.2* 04/22/2012   HCT  38.7 04/22/2012   MCV 92.1 04/22/2012   PLT 514* 04/22/2012      Chemistry      Component Value Date/Time   NA 143 04/12/2012 1300   NA 144 01/16/2012 1057   K 3.8 04/12/2012 1300   K 4.6 01/16/2012 1057   CL 106 04/12/2012 1300   CL 106 01/16/2012 1057   CO2 20 04/12/2012 1300   CO2 20* 01/16/2012 1057   BUN 107* 04/12/2012 1300   BUN 110.0* 01/16/2012 1057   CREATININE 3.91* 04/12/2012 1300   CREATININE 4.4 Repeated and Verified* 01/16/2012 1057      Component Value Date/Time   CALCIUM 8.4 04/12/2012 1300   CALCIUM 9.0 02/15/2012 1258   CALCIUM 8.7 01/16/2012 1057   ALKPHOS 97 01/16/2012 1057   ALKPHOS 116 10/11/2011 1506   AST 15 01/16/2012 1057   AST 18 10/11/2011 1506   ALT 11 01/16/2012 1057   ALT 15 10/11/2011 1506   BILITOT 0.60 01/16/2012 1057   BILITOT 0.3 10/11/2011 1506       RADIOGRAPHIC STUDIES: No results found.  ASSESSMENT: This is a very pleasant 79 years old white male with essential thrombocythemia currently on treatment with anagrelide. The patient is rating his treatment fairly well with no significant adverse effects. His platelets count are still within the acceptable range but elevated compared to 3 months ago.  PLAN: I recommended for the patient to continue treatment with anagrelide 2.0 mg by mouth daily for now. I will check a CBC in 6 weeks and consider adjusting his medication based on the platelets count at that time. He would come back for followup visit in 3 months with repeat CBC, comprehensive metabolic panel and LDH. The patient was advised to call immediately if he has any concerning symptoms in the interval.  All questions were answered. The patient knows to call the clinic with any problems, questions or concerns. We can certainly see the patient much sooner if necessary.

## 2012-04-22 NOTE — Telephone Encounter (Signed)
appts made and printed for pt aom °

## 2012-04-22 NOTE — Patient Instructions (Signed)
Continue current treatment with anagrelide. Followup in 3 months

## 2012-04-22 NOTE — Progress Notes (Signed)
Dr Donnald Garre aware of critical Creatinine value.  SLJ

## 2012-04-24 ENCOUNTER — Other Ambulatory Visit (HOSPITAL_COMMUNITY): Payer: Self-pay | Admitting: *Deleted

## 2012-04-25 ENCOUNTER — Encounter (HOSPITAL_COMMUNITY)
Admission: RE | Admit: 2012-04-25 | Discharge: 2012-04-25 | Disposition: A | Discharge status: Home / Self Care | Payer: Medicare Other | Source: Ambulatory Visit | Attending: Nephrology | Admitting: Nephrology

## 2012-04-25 MED ORDER — EPOETIN ALFA 10000 UNIT/ML IJ SOLN
INTRAMUSCULAR | Status: AC
  Administered 2012-04-25: 10000 [IU] via SUBCUTANEOUS
  Filled 2012-04-25: qty 1

## 2012-05-08 ENCOUNTER — Encounter: Payer: Self-pay | Admitting: Internal Medicine

## 2012-05-08 ENCOUNTER — Ambulatory Visit: Payer: Medicare Other | Admitting: Internal Medicine

## 2012-05-08 ENCOUNTER — Ambulatory Visit (INDEPENDENT_AMBULATORY_CARE_PROVIDER_SITE_OTHER): Payer: Medicare Other | Admitting: Internal Medicine

## 2012-05-08 VITALS — BP 148/64 | HR 80 | Temp 98.2°F | Resp 16 | Ht 65.0 in | Wt 138.0 lb

## 2012-05-08 DIAGNOSIS — I10 Essential (primary) hypertension: Secondary | ICD-10-CM

## 2012-05-08 DIAGNOSIS — N19 Unspecified kidney failure: Secondary | ICD-10-CM

## 2012-05-08 DIAGNOSIS — D649 Anemia, unspecified: Secondary | ICD-10-CM

## 2012-05-08 NOTE — Progress Notes (Signed)
  Subjective:    Patient ID: Ryan Arellano, male    DOB: [birth year], 79 y.o.   MRN: [id number]  HPI  The patient has been follow for renal failure and has noted a slight improvement to 3.5 of creatinine and Hgb has been followed with intermient procrit Breathing well and HTN stable Review of Systems  Constitutional: Negative for fever and fatigue.  HENT: Positive for neck stiffness. Negative for hearing loss, neck pain and postnasal drip.   Eyes: Negative for discharge and visual disturbance.  Respiratory: Negative for cough, shortness of breath and wheezing.   Cardiovascular: Negative for leg swelling.  Gastrointestinal: Negative for abdominal pain, constipation and abdominal distention.  Genitourinary: Negative for urgency and frequency.  Musculoskeletal: Negative for joint swelling and arthralgias.  Skin: Negative for color change and rash.  Neurological: Negative for weakness and light-headedness.  Hematological: Negative for adenopathy.  Psychiatric/Behavioral: Negative for behavioral problems.       Objective:   Physical Exam  Nursing note and vitals reviewed. Constitutional: He appears well-developed and well-nourished.  HENT:  Head: Normocephalic and atraumatic.  Eyes: Conjunctivae normal are normal. Pupils are equal, round, and reactive to light.  Neck: Normal range of motion. Neck supple.  Cardiovascular: Normal rate and regular rhythm.   Pulmonary/Chest: Effort normal and breath sounds normal.  Abdominal: Soft. Bowel sounds are normal.  Skin: Skin is warm and dry.  Psychiatric: He has a normal mood and affect. His behavior is normal.          Assessment & Plan:  Stable HTN ands renal failure per report Stable CBC  Slight bump in platelets with the procrit Stable AF Slight increased blood pressure  May use tylenol with the ultram up to twice a day

## 2012-05-08 NOTE — Patient Instructions (Signed)
The patient is instructed to continue all medications as prescribed. Schedule followup with check out clerk upon leaving the clinic  

## 2012-05-09 ENCOUNTER — Encounter (HOSPITAL_COMMUNITY)
Admission: RE | Admit: 2012-05-09 | Discharge: 2012-05-09 | Disposition: A | Discharge status: Home / Self Care | Payer: Medicare Other | Source: Ambulatory Visit | Attending: Nephrology | Admitting: Nephrology

## 2012-05-09 LAB — RENAL FUNCTION PANEL
CO2: 22 mEq/L (ref 19–32)
Chloride: 104 mEq/L (ref 96–112)
GFR calc Af Amer: 14 mL/min — ABNORMAL LOW (ref >90)
GFR calc non Af Amer: 12 mL/min — ABNORMAL LOW (ref >90)
Potassium: 4.1 mEq/L (ref 3.5–5.1)
Sodium: 142 mEq/L (ref 135–145)

## 2012-05-09 LAB — IRON AND TIBC: Iron: 80 ug/dL (ref 42–135)

## 2012-05-09 LAB — FERRITIN: Ferritin: 1163 ng/mL — ABNORMAL HIGH (ref 22–322)

## 2012-05-09 LAB — POCT HEMOGLOBIN-HEMACUE: Hemoglobin: 11.6 g/dL — ABNORMAL LOW (ref 13.0–17.0)

## 2012-05-09 MED ORDER — EPOETIN ALFA 10000 UNIT/ML IJ SOLN
10000.0000 [IU] | INTRAMUSCULAR | Status: DC
  Administered 2012-05-09: 10000 [IU] via SUBCUTANEOUS

## 2012-05-15 ENCOUNTER — Telehealth: Payer: Self-pay | Admitting: Internal Medicine

## 2012-05-15 NOTE — Telephone Encounter (Signed)
He has tramadol ordered, may need to call nephrologist and make sure ok to take

## 2012-05-15 NOTE — Telephone Encounter (Signed)
Left message on machine for patient to return our call 

## 2012-05-15 NOTE — Telephone Encounter (Signed)
Patient Information:  Caller Name: Lupita Leash  Phone: [phone]  Patient: Ryan Arellano, Ryan Arellano  Gender: Male  DOB: [birth year]  Age: 79 Years  PCP: Darryll Capers (Adults only)  Office Follow Up:  Does the office need to follow up with this patient?: Yes  Instructions For The Office: see RN notes  RN Note:  Daughter wants to know what pain medication the patient can take to help the pain. May call her at: [phone]. Thanks.  Symptoms  Reason For Call & Symptoms: Reports patient is having gout attack. Was not able to sleep through the night last night due to pain. Reports right hand and fingers are painful and swollen.  Reviewed Health History In EMR: Yes  Reviewed Medications In EMR: Yes  Reviewed Allergies In EMR: Yes  Reviewed Surgeries / Procedures: Yes  Date of Onset of Symptoms: 05/14/2012  Treatments Tried: Tramadol for shingles pain  Treatments Tried Worked: No  Guideline(s) Used:  No Protocol Available - Information Only  Disposition Per Guideline:   Discuss with PCP and Callback by Nurse Today  Reason For Disposition Reached:   Nursing judgment  Advice Given:  N/A

## 2012-05-15 NOTE — Telephone Encounter (Signed)
Caller: Donna/Daughter; Phone: [phone]; Reason for Call: Callback from office about her dad and office called on the wrong line, she wants to be called on home line at [phone], requesting a callback

## 2012-05-16 ENCOUNTER — Other Ambulatory Visit: Payer: Self-pay | Admitting: *Deleted

## 2012-05-16 MED ORDER — COLCHICINE 0.6 MG PO TABS: 0.6000 mg | ORAL_TABLET | Freq: Every day | ORAL | Status: DC

## 2012-05-16 MED ORDER — COLCHICINE 0.6 MG PO TABS: 0.6000 mg | ORAL_TABLET | Freq: Two times a day (BID) | ORAL | Status: DC

## 2012-05-16 NOTE — Telephone Encounter (Signed)
Talked with daughter and father has had flare up of gout-tramadol isnt helping- takes uloric and states was instructed by nephrologist to never use anti inflammatory.  Please advise

## 2012-05-16 NOTE — Telephone Encounter (Signed)
Spoke to pt's wife said it was being addressed by Reva Bores to her this morning. Told her okay.

## 2012-05-16 NOTE — Telephone Encounter (Signed)
Per dr Lovell Sheehan take colchicine bid for 30 days

## 2012-05-20 ENCOUNTER — Other Ambulatory Visit: Payer: Self-pay | Admitting: Internal Medicine

## 2012-05-23 ENCOUNTER — Inpatient Hospital Stay (HOSPITAL_COMMUNITY): Admission: RE | Admit: 2012-05-23 | Payer: Medicare Other | Source: Ambulatory Visit

## 2012-05-29 ENCOUNTER — Encounter (HOSPITAL_COMMUNITY)
Admission: RE | Admit: 2012-05-29 | Discharge: 2012-05-29 | Disposition: A | Discharge status: Home / Self Care | Payer: Medicare Other | Source: Ambulatory Visit | Attending: Nephrology | Admitting: Nephrology

## 2012-05-29 DIAGNOSIS — D638 Anemia in other chronic diseases classified elsewhere: Secondary | ICD-10-CM | POA: Insufficient documentation

## 2012-05-29 DIAGNOSIS — N184 Chronic kidney disease, stage 4 (severe): Secondary | ICD-10-CM | POA: Insufficient documentation

## 2012-05-29 LAB — POCT HEMOGLOBIN-HEMACUE: Hemoglobin: 13.4 g/dL (ref 13.0–17.0)

## 2012-06-03 ENCOUNTER — Other Ambulatory Visit: Payer: Medicare Other

## 2012-06-05 ENCOUNTER — Other Ambulatory Visit (HOSPITAL_BASED_OUTPATIENT_CLINIC_OR_DEPARTMENT_OTHER): Payer: Medicare Other | Admitting: Lab

## 2012-06-05 DIAGNOSIS — D473 Essential (hemorrhagic) thrombocythemia: Secondary | ICD-10-CM

## 2012-06-05 LAB — CBC WITH DIFFERENTIAL/PLATELET
BASO%: 2 % (ref 0.0–2.0)
EOS%: 1.8 % (ref 0.0–7.0)
LYMPH%: 11.6 % — ABNORMAL LOW (ref 14.0–49.0)
MCHC: 32.3 g/dL (ref 32.0–36.0)
MONO#: 0.8 10*3/uL (ref 0.1–0.9)
RBC: 4.56 10*6/uL (ref 4.20–5.82)
WBC: 8.2 10*3/uL (ref 4.0–10.3)
lymph#: 0.9 10*3/uL (ref 0.9–3.3)

## 2012-06-13 ENCOUNTER — Encounter (HOSPITAL_COMMUNITY)
Admission: RE | Admit: 2012-06-13 | Discharge: 2012-06-13 | Disposition: A | Discharge status: Home / Self Care | Payer: Medicare Other | Source: Ambulatory Visit | Attending: Nephrology | Admitting: Nephrology

## 2012-06-13 DIAGNOSIS — D638 Anemia in other chronic diseases classified elsewhere: Secondary | ICD-10-CM | POA: Insufficient documentation

## 2012-06-13 DIAGNOSIS — N184 Chronic kidney disease, stage 4 (severe): Secondary | ICD-10-CM | POA: Insufficient documentation

## 2012-06-13 LAB — RENAL FUNCTION PANEL
Albumin: 3.7 g/dL (ref 3.5–5.2)
BUN: 105 mg/dL — ABNORMAL HIGH (ref 6–23)
Chloride: 99 mEq/L (ref 96–112)
Creatinine, Ser: 4.54 mg/dL — ABNORMAL HIGH (ref 0.50–1.35)
Glucose, Bld: 109 mg/dL — ABNORMAL HIGH (ref 70–99)

## 2012-06-13 LAB — IRON AND TIBC
Iron: 133 ug/dL (ref 42–135)
Saturation Ratios: 54 % (ref 20–55)
UIBC: 113 ug/dL — ABNORMAL LOW (ref 125–400)

## 2012-06-13 MED ORDER — EPOETIN ALFA 10000 UNIT/ML IJ SOLN: 10000.0000 [IU] | INTRAMUSCULAR | Status: DC

## 2012-06-16 ENCOUNTER — Other Ambulatory Visit: Payer: Self-pay | Admitting: Internal Medicine

## 2012-06-27 ENCOUNTER — Encounter (HOSPITAL_COMMUNITY)
Admission: RE | Admit: 2012-06-27 | Discharge: 2012-06-27 | Disposition: A | Discharge status: Home / Self Care | Payer: Medicare Other | Source: Ambulatory Visit | Attending: Nephrology | Admitting: Nephrology

## 2012-07-08 ENCOUNTER — Other Ambulatory Visit: Payer: Self-pay | Admitting: Internal Medicine

## 2012-07-11 ENCOUNTER — Encounter (HOSPITAL_COMMUNITY)
Admission: RE | Admit: 2012-07-11 | Discharge: 2012-07-11 | Disposition: A | Discharge status: Home / Self Care | Payer: Medicare Other | Source: Ambulatory Visit | Attending: Nephrology | Admitting: Nephrology

## 2012-07-11 DIAGNOSIS — N184 Chronic kidney disease, stage 4 (severe): Secondary | ICD-10-CM | POA: Insufficient documentation

## 2012-07-11 DIAGNOSIS — D638 Anemia in other chronic diseases classified elsewhere: Secondary | ICD-10-CM | POA: Insufficient documentation

## 2012-07-11 LAB — RENAL FUNCTION PANEL
Albumin: 3.6 g/dL (ref 3.5–5.2)
Calcium: 8.8 mg/dL (ref 8.4–10.5)
GFR calc Af Amer: 13 mL/min — ABNORMAL LOW (ref >90)
GFR calc non Af Amer: 11 mL/min — ABNORMAL LOW (ref >90)
Phosphorus: 4.1 mg/dL (ref 2.3–4.6)
Sodium: 139 mEq/L (ref 135–145)

## 2012-07-11 LAB — IRON AND TIBC
Saturation Ratios: 57 % — ABNORMAL HIGH (ref 20–55)
TIBC: 269 ug/dL (ref 215–435)
UIBC: 117 ug/dL — ABNORMAL LOW (ref 125–400)

## 2012-07-11 LAB — FERRITIN: Ferritin: 1247 ng/mL — ABNORMAL HIGH (ref 22–322)

## 2012-07-11 MED ORDER — EPOETIN ALFA 10000 UNIT/ML IJ SOLN
10000.0000 [IU] | INTRAMUSCULAR | Status: DC
  Administered 2012-07-11: 10000 [IU] via SUBCUTANEOUS

## 2012-07-22 ENCOUNTER — Telehealth: Payer: Self-pay | Admitting: Internal Medicine

## 2012-07-22 ENCOUNTER — Other Ambulatory Visit (HOSPITAL_BASED_OUTPATIENT_CLINIC_OR_DEPARTMENT_OTHER): Payer: Medicare Other | Admitting: Lab

## 2012-07-22 ENCOUNTER — Encounter: Payer: Self-pay | Admitting: Medical Oncology

## 2012-07-22 ENCOUNTER — Ambulatory Visit (HOSPITAL_BASED_OUTPATIENT_CLINIC_OR_DEPARTMENT_OTHER): Payer: Medicare Other | Admitting: Internal Medicine

## 2012-07-22 ENCOUNTER — Encounter: Payer: Self-pay | Admitting: Internal Medicine

## 2012-07-22 VITALS — BP 123/50 | HR 68 | Temp 97.5°F | Resp 18 | Ht 65.0 in | Wt 135.6 lb

## 2012-07-22 DIAGNOSIS — N289 Disorder of kidney and ureter, unspecified: Secondary | ICD-10-CM

## 2012-07-22 DIAGNOSIS — D473 Essential (hemorrhagic) thrombocythemia: Secondary | ICD-10-CM

## 2012-07-22 LAB — COMPREHENSIVE METABOLIC PANEL (CC13)
Alkaline Phosphatase: 113 U/L (ref 40–150)
Glucose: 142 mg/dl — ABNORMAL HIGH (ref 70–99)
Sodium: 140 mEq/L (ref 136–145)
Total Bilirubin: 0.56 mg/dL (ref 0.20–1.20)
Total Protein: 7.1 g/dL (ref 6.4–8.3)

## 2012-07-22 LAB — CBC WITH DIFFERENTIAL/PLATELET
Basophils Absolute: 0.1 10*3/uL (ref 0.0–0.1)
EOS%: 1.9 % (ref 0.0–7.0)
HGB: 10.6 g/dL — ABNORMAL LOW (ref 13.0–17.1)
MCH: 28.3 pg (ref 27.2–33.4)
MCHC: 32.3 g/dL (ref 32.0–36.0)
MCV: 87.5 fL (ref 79.3–98.0)
MONO%: 7.8 % (ref 0.0–14.0)
RBC: 3.75 10*6/uL — ABNORMAL LOW (ref 4.20–5.82)
RDW: 16.5 % — ABNORMAL HIGH (ref 11.0–14.6)

## 2012-07-22 NOTE — Telephone Encounter (Signed)
gv and printed appt sched and avs for pt  °

## 2012-07-22 NOTE — Progress Notes (Signed)
[facility] [phone]   [phone]) [phone]  OFFICE PROGRESS NOTE  [name], MD [address] [place] [address]  DIAGNOSIS: Essential thrombocythemia   CURRENT THERAPY: Anagrelide 2.5 mg by mouth daily.  INTERVAL HISTORY: Ryan Arellano 79 y.o. male returns to the clinic today for routine three-month followup visit accompanied by his daughter. The patient is feeling fine today with no specific complaints except for mild fatigue. He denied having any bleeding issues. He denied having any chest pain but continues to have shortness breath with exertion, no cough or hemoptysis. The patient is currently followed by nephrology for his renal insufficiency and is not expected to do any hemodialysis anytime soon. He is tolerating his treatment with anagrelide fairly well. He had repeat CBC performed earlier today and he seems for evaluation and discussion of his lab results.  MEDICAL HISTORY: Past Medical History  Diagnosis Date  . Pulmonary hypertension   . Atrial fibrillation   . Thrombocytopenia   . Anemia   . Arthritis   . Diverticulitis   . Hypertension   . Renal insufficiency   . Skin lesion of back     ALLERGIES:  has No Known Allergies.  MEDICATIONS:  Current Outpatient Prescriptions  Medication Sig Dispense Refill  . anagrelide (AGRYLIN) 0.5 MG capsule 0.5 mg 4 (four) times daily.       Marland Kitchen aspirin 81 MG tablet Take 81 mg by mouth daily.        . calcitRIOL (ROCALTROL) 0.25 MCG capsule Take 0.25 mcg by mouth. 1 on Monday ,wednesday, and friday      . calcium acetate (PHOSLO) 667 MG capsule Take 667 mg by mouth 3 (three) times daily with meals.      . carvedilol (COREG) 25 MG tablet 1.5 twice a day      . cloNIDine (CATAPRES) 0.2 MG tablet TAKE 1 TABLET 3 TIMES A DAY  90 tablet  6  . colchicine 0.6 MG tablet Take 1 tablet (0.6 mg total) by mouth 2 (two) times daily.  60 tablet  1  . doxazosin (CARDURA) 1 MG tablet TAKE  1 TABLET AT BEDTIME  30 tablet  11  . esomeprazole (NEXIUM) 40 MG capsule Take 1 capsule (40 mg total) by mouth daily before breakfast.  90 capsule  3  . febuxostat (ULORIC) 40 MG tablet Take 40 mg by mouth daily.       . folic acid (FOLVITE) 1 MG tablet TAKE 1 TABLET TWICE A DAY  180 tablet  2  . folic acid (FOLVITE) 1 MG tablet TAKE 1 TABLET TWICE A DAY  180 tablet  2  . furosemide (LASIX) 80 MG tablet Take 160 mg by mouth 2 (two) times daily.      . hydrALAZINE (APRESOLINE) 50 MG tablet Take 100 mg by mouth 2 (two) times daily.       Marland Kitchen lidocaine (LIDODERM) 5 % Place 3 patches onto the skin daily.  90 patch  4  . NEXIUM 40 MG capsule TAKE ONE CAPSULE EVERY DAY  90 capsule  1  . sodium bicarbonate 650 MG tablet Take 650 mg by mouth 3 (three) times daily.      . traMADol (ULTRAM) 50 MG tablet TAKE 1 TABLET BY MOUTH EVERY 6 HOURS AS NEEDED FOR PAIN.  100 tablet  2  . vitamin C (ASCORBIC ACID) 250 MG tablet Take 250 mg by mouth daily.      Marland Kitchen UNABLE TO  FIND Pt unsure of name, it is a topical cream that pt puts on foot for arthritis       No current facility-administered medications for this visit.    SURGICAL HISTORY:  Past Surgical History  Procedure Laterality Date  . Ruptured colon  1980  . S/p colectomy  1980  . Tonsillectomy    . Av fistula placement  2002    left arm    REVIEW OF SYSTEMS:  A comprehensive review of systems was negative except for: Constitutional: positive for fatigue Respiratory: positive for dyspnea on exertion   PHYSICAL EXAMINATION: General appearance: alert, cooperative and no distress Head: Normocephalic, without obvious abnormality, atraumatic Neck: no adenopathy Resp: clear to auscultation bilaterally Cardio: regular rate and rhythm, S1, S2 normal, no murmur, click, rub or gallop GI: soft, non-tender; bowel sounds normal; no masses,  no organomegaly Extremities: extremities normal, atraumatic, no cyanosis or edema  ECOG PERFORMANCE STATUS: 1 -  Symptomatic but completely ambulatory  Blood pressure 123/50, pulse 68, temperature 97.5 F (36.4 C), temperature source Oral, resp. rate 18, height 5\' 5"  (1.651 m), weight 135 lb 9.6 oz (61.508 kg).  LABORATORY DATA: Lab Results  Component Value Date   WBC 9.0 07/22/2012   HGB 10.6* 07/22/2012   HCT 32.8* 07/22/2012   MCV 87.5 07/22/2012   PLT 710* 07/22/2012      Chemistry      Component Value Date/Time   NA 139 07/11/2012 1300   NA 141 04/22/2012 1322   K 4.0 07/11/2012 1300   K 4.2 04/22/2012 1322   CL 103 07/11/2012 1300   CL 108* 04/22/2012 1322   CO2 20 07/11/2012 1300   CO2 21* 04/22/2012 1322   BUN 120* 07/11/2012 1300   BUN 115.0* 04/22/2012 1322   CREATININE 4.32* 07/11/2012 1300   CREATININE 4.1 Repeated and Verified* 04/22/2012 1322      Component Value Date/Time   CALCIUM 8.8 07/11/2012 1300   CALCIUM 8.7 04/22/2012 1322   CALCIUM 9.0 02/15/2012 1258   ALKPHOS 116 04/22/2012 1322   ALKPHOS 116 10/11/2011 1506   AST 18 04/22/2012 1322   AST 18 10/11/2011 1506   ALT 13 04/22/2012 1322   ALT 15 10/11/2011 1506   BILITOT 0.52 04/22/2012 1322   BILITOT 0.3 10/11/2011 1506       RADIOGRAPHIC STUDIES: No results found.  ASSESSMENT: This is a very pleasant 79 years old white male with history of essential thrombocythemia currently on anagrelide 2.0 mg by mouth daily. His platelets count are still elevated but stable.   PLAN: I discussed the lab result with the patient and his daughter. I recommended for him to continue on anagrelide but I would increase her dose to 2.5 mg by mouth daily. I would see him back for followup visit in 3 months with repeat CBC and LDH. He was advised to call immediately if he has any concerning symptoms in the interval.  All questions were answered. The patient knows to call the clinic with any problems, questions or concerns. We can certainly see the patient much sooner if necessary.

## 2012-07-22 NOTE — Patient Instructions (Addendum)
Your platelets counts are still elevated but stable. We'll increase the dose of anagrelide to 2.5 mg by mouth daily. Follow up in 3 months with repeat CBC

## 2012-07-24 ENCOUNTER — Other Ambulatory Visit (HOSPITAL_COMMUNITY): Payer: Self-pay | Admitting: *Deleted

## 2012-07-25 ENCOUNTER — Encounter (HOSPITAL_COMMUNITY)
Admission: RE | Admit: 2012-07-25 | Discharge: 2012-07-25 | Disposition: A | Discharge status: Home / Self Care | Payer: Medicare Other | Source: Ambulatory Visit | Attending: Nephrology | Admitting: Nephrology

## 2012-07-25 MED ORDER — EPOETIN ALFA 10000 UNIT/ML IJ SOLN: 8500.0000 [IU] | Freq: Once | INTRAMUSCULAR | Status: DC

## 2012-08-08 ENCOUNTER — Encounter (HOSPITAL_COMMUNITY)
Admission: RE | Admit: 2012-08-08 | Discharge: 2012-08-08 | Disposition: A | Discharge status: Home / Self Care | Payer: Medicare Other | Source: Ambulatory Visit | Attending: Nephrology | Admitting: Nephrology

## 2012-08-08 DIAGNOSIS — N184 Chronic kidney disease, stage 4 (severe): Secondary | ICD-10-CM | POA: Insufficient documentation

## 2012-08-08 DIAGNOSIS — D638 Anemia in other chronic diseases classified elsewhere: Secondary | ICD-10-CM | POA: Insufficient documentation

## 2012-08-08 LAB — RENAL FUNCTION PANEL
BUN: 104 mg/dL — ABNORMAL HIGH (ref 6–23)
CO2: 21 mEq/L (ref 19–32)
GFR calc Af Amer: 11 mL/min — ABNORMAL LOW (ref >90)
Glucose, Bld: 104 mg/dL — ABNORMAL HIGH (ref 70–99)
Potassium: 3.8 mEq/L (ref 3.5–5.1)
Sodium: 141 mEq/L (ref 135–145)

## 2012-08-08 LAB — POCT HEMOGLOBIN-HEMACUE: Hemoglobin: 10.7 g/dL — ABNORMAL LOW (ref 13.0–17.0)

## 2012-08-08 LAB — IRON AND TIBC
Saturation Ratios: 34 % (ref 20–55)
TIBC: 268 ug/dL (ref 215–435)

## 2012-08-08 MED ORDER — EPOETIN ALFA 10000 UNIT/ML IJ SOLN
INTRAMUSCULAR | Status: AC
  Administered 2012-08-08: 8500 [IU] via SUBCUTANEOUS
  Filled 2012-08-08: qty 1

## 2012-08-08 MED ORDER — EPOETIN ALFA 10000 UNIT/ML IJ SOLN: 8500.0000 [IU] | INTRAMUSCULAR | Status: DC

## 2012-08-22 ENCOUNTER — Encounter (HOSPITAL_COMMUNITY)
Admission: RE | Admit: 2012-08-22 | Discharge: 2012-08-22 | Disposition: A | Discharge status: Home / Self Care | Payer: Medicare Other | Source: Ambulatory Visit | Attending: Nephrology | Admitting: Nephrology

## 2012-09-06 ENCOUNTER — Ambulatory Visit: Payer: Medicare Other | Admitting: Internal Medicine

## 2012-09-09 ENCOUNTER — Encounter: Payer: Self-pay | Admitting: Internal Medicine

## 2012-09-09 ENCOUNTER — Ambulatory Visit (INDEPENDENT_AMBULATORY_CARE_PROVIDER_SITE_OTHER): Payer: Medicare Other | Admitting: Internal Medicine

## 2012-09-09 ENCOUNTER — Other Ambulatory Visit: Payer: Self-pay | Admitting: Internal Medicine

## 2012-09-09 VITALS — BP 110/90 | HR 76 | Temp 98.2°F | Resp 16 | Ht 65.0 in | Wt 140.0 lb

## 2012-09-09 DIAGNOSIS — N186 End stage renal disease: Secondary | ICD-10-CM

## 2012-09-09 DIAGNOSIS — I10 Essential (primary) hypertension: Secondary | ICD-10-CM

## 2012-09-09 DIAGNOSIS — I2789 Other specified pulmonary heart diseases: Secondary | ICD-10-CM

## 2012-09-09 MED ORDER — HYDRALAZINE HCL 50 MG PO TABS: 25.0000 mg | ORAL_TABLET | Freq: Three times a day (TID) | ORAL | Status: DC

## 2012-09-09 NOTE — Patient Instructions (Signed)
To aid with the episodes of hypotension we will change the hydralazine to 25 mg or one half tablet 3 times a day these may be taken at the same time that he takes his clonidine

## 2012-09-09 NOTE — Progress Notes (Signed)
Subjective:    Patient ID: Ryan Arellano, male    DOB: October 31, 1921, 79 y.o.   MRN: [id number]  HPI  The patient has noted hypotensive episodes Has been on less hydralazine He has not had an episode since he decreased the dose    Review of Systems  Constitutional: Negative for fever and fatigue.  HENT: Negative for hearing loss, congestion, neck pain and postnasal drip.   Eyes: Negative for discharge, redness and visual disturbance.  Respiratory: Negative for cough, shortness of breath and wheezing.   Cardiovascular: Negative for leg swelling.  Gastrointestinal: Negative for abdominal pain, constipation and abdominal distention.  Genitourinary: Negative for urgency and frequency.  Musculoskeletal: Negative for joint swelling and arthralgias.  Skin: Negative for color change and rash.  Neurological: Negative for weakness and light-headedness.  Hematological: Negative for adenopathy.  Psychiatric/Behavioral: Negative for behavioral problems.       Past Medical History  Diagnosis Date  . Pulmonary hypertension   . Atrial fibrillation   . Thrombocytopenia   . Anemia   . Arthritis   . Diverticulitis   . Hypertension   . Renal insufficiency   . Skin lesion of back     History   Social History  . Marital Status: Widowed    Spouse Name: N/A    Number of Children: N/A  . Years of Education: N/A   Occupational History  . retired    Social History Main Topics  . Smoking status: Former Smoker -- 0.50 packs/day for 40 years    Types: Cigarettes    Quit date: 04/11/1979  . Smokeless tobacco: Never Used  . Alcohol Use: Yes     Comment: once per week  . Drug Use: No  . Sexually Active: No   Other Topics Concern  . Not on file   Social History Narrative  . No narrative on file    Past Surgical History  Procedure Laterality Date  . Ruptured colon  1980  . S/p colectomy  1980  . Tonsillectomy    . Av fistula placement  2002    left arm    Family History   Problem Relation Age of Onset  . Heart disease Father   . Hypertension Father   . Heart disease Mother     No Known Allergies  Current Outpatient Prescriptions on File Prior to Visit  Medication Sig Dispense Refill  . anagrelide (AGRYLIN) 0.5 MG capsule Take 2.5 mg by mouth daily.       Marland Kitchen aspirin 81 MG tablet Take 81 mg by mouth daily.        . calcitRIOL (ROCALTROL) 0.25 MCG capsule Take 0.25 mcg by mouth. 1 on Monday ,wednesday, and friday      . calcium acetate (PHOSLO) 667 MG capsule Take 667 mg by mouth 3 (three) times daily with meals.      . carvedilol (COREG) 25 MG tablet 1.5 twice a day      . cloNIDine (CATAPRES) 0.2 MG tablet TAKE 1 TABLET 3 TIMES A DAY  90 tablet  6  . colchicine 0.6 MG tablet Take 1 tablet (0.6 mg total) by mouth 2 (two) times daily.  60 tablet  1  . doxazosin (CARDURA) 1 MG tablet TAKE 1 TABLET AT BEDTIME  30 tablet  11  . esomeprazole (NEXIUM) 40 MG capsule Take 1 capsule (40 mg total) by mouth daily before breakfast.  90 capsule  3  . febuxostat (ULORIC) 40 MG tablet Take 40 mg by mouth  daily.       . folic acid (FOLVITE) 1 MG tablet TAKE 1 TABLET TWICE A DAY  180 tablet  2  . folic acid (FOLVITE) 1 MG tablet TAKE 1 TABLET TWICE A DAY  180 tablet  2  . furosemide (LASIX) 80 MG tablet Take 160 mg by mouth 2 (two) times daily.      . hydrALAZINE (APRESOLINE) 50 MG tablet Take 50 mg by mouth 2 (two) times daily.       Marland Kitchen lidocaine (LIDODERM) 5 % Place 3 patches onto the skin daily.  90 patch  4  . NEXIUM 40 MG capsule TAKE ONE CAPSULE EVERY DAY  90 capsule  1  . sodium bicarbonate 650 MG tablet Take 650 mg by mouth 3 (three) times daily.      . traMADol (ULTRAM) 50 MG tablet TAKE 1 TABLET BY MOUTH EVERY 6 HOURS AS NEEDED FOR PAIN.  100 tablet  2  . UNABLE TO FIND Pt unsure of name, it is a topical cream that pt puts on foot for arthritis      . vitamin C (ASCORBIC ACID) 250 MG tablet Take 250 mg by mouth daily.       No current facility-administered  medications on file prior to visit.    BP 110/90  Pulse 76  Temp(Src) 98.2 F (36.8 C)  Resp 16  Ht 5\' 5"  (1.651 m)  Wt 140 lb (63.504 kg)  BMI 23.3 kg/m2    Objective:   Physical Exam  Constitutional: He is oriented to person, place, and time.  Orthostatic changes noted in systolic blood pressure  Eyes: Conjunctivae are normal. Pupils are equal, round, and reactive to light.  Cardiovascular: Normal rate.   Murmur heard. Pulmonary/Chest: Effort normal and breath sounds normal.  Abdominal: Soft. Bowel sounds are normal.  Musculoskeletal: He exhibits edema and tenderness.  Neurological: He is alert and oriented to person, place, and time.          Assessment & Plan:  Cause of the hypotension we will change the hydralazine to 3 times a day tablet more even distribution of the medication he will take along with clonidine 3 times a day that is already scheduled to take.  I agree with monitoring the blood pressure each time he has an episode like this but I think since he has decreased the medication he has not noticed an as many or any hypo-tensive episodes.  He is followed by renal for end-stage renal diseaseand cardiology for atrial fib and congestive heart

## 2012-09-10 ENCOUNTER — Encounter (HOSPITAL_COMMUNITY)
Admission: RE | Admit: 2012-09-10 | Discharge: 2012-09-10 | Disposition: A | Discharge status: Home / Self Care | Payer: Medicare Other | Source: Ambulatory Visit | Attending: Nephrology | Admitting: Nephrology

## 2012-09-10 DIAGNOSIS — N184 Chronic kidney disease, stage 4 (severe): Secondary | ICD-10-CM | POA: Insufficient documentation

## 2012-09-10 DIAGNOSIS — D638 Anemia in other chronic diseases classified elsewhere: Secondary | ICD-10-CM | POA: Insufficient documentation

## 2012-09-10 LAB — RENAL FUNCTION PANEL
BUN: 91 mg/dL — ABNORMAL HIGH (ref 6–23)
CO2: 19 mEq/L (ref 19–32)
Calcium: 8.8 mg/dL (ref 8.4–10.5)
Creatinine, Ser: 3.8 mg/dL — ABNORMAL HIGH (ref 0.50–1.35)
Glucose, Bld: 95 mg/dL (ref 70–99)
Phosphorus: 4 mg/dL (ref 2.3–4.6)

## 2012-09-10 LAB — IRON AND TIBC
Iron: 72 ug/dL (ref 42–135)
Saturation Ratios: 30 % (ref 20–55)
TIBC: 241 ug/dL (ref 215–435)

## 2012-09-10 MED ORDER — EPOETIN ALFA 10000 UNIT/ML IJ SOLN
INTRAMUSCULAR | Status: AC
  Filled 2012-09-10: qty 1

## 2012-09-10 MED ORDER — EPOETIN ALFA 10000 UNIT/ML IJ SOLN
8500.0000 [IU] | INTRAMUSCULAR | Status: DC
  Administered 2012-09-10: 8500 [IU] via SUBCUTANEOUS

## 2012-09-20 ENCOUNTER — Ambulatory Visit (INDEPENDENT_AMBULATORY_CARE_PROVIDER_SITE_OTHER): Payer: Medicare Other | Admitting: Family Medicine

## 2012-09-20 ENCOUNTER — Encounter: Payer: Self-pay | Admitting: Family Medicine

## 2012-09-20 VITALS — BP 94/42 | Temp 98.3°F | Wt 138.0 lb

## 2012-09-20 DIAGNOSIS — H1033 Unspecified acute conjunctivitis, bilateral: Secondary | ICD-10-CM

## 2012-09-20 DIAGNOSIS — H1030 Unspecified acute conjunctivitis, unspecified eye: Secondary | ICD-10-CM

## 2012-09-20 MED ORDER — TOBRAMYCIN 0.3 % OP SOLN: 1.0000 [drp] | OPHTHALMIC | Status: DC

## 2012-09-20 NOTE — Patient Instructions (Addendum)

## 2012-09-20 NOTE — Progress Notes (Signed)
  Subjective:    Patient ID: Ryan Arellano, male    DOB: 09/29/1921, 79 y.o.   MRN: [id number]  HPI Acute visit Patient seen with bilateral eyes crusted drainage past few days. No visual changes. Some itching. He had some nasal congestive symptoms. Started Zyrtec yesterday No visual changes. No eye pain.  Chronic problems include history of hypertension, atrial fibrillation, cerebrovascular disease, end-stage renal disease.  Past Medical History  Diagnosis Date  . Pulmonary hypertension   . Atrial fibrillation   . Thrombocytopenia   . Anemia   . Arthritis   . Diverticulitis   . Hypertension   . Renal insufficiency   . Skin lesion of back    Past Surgical History  Procedure Laterality Date  . Ruptured colon  1980  . S/p colectomy  1980  . Tonsillectomy    . Av fistula placement  2002    left arm    reports that he quit smoking about 33 years ago. His smoking use included Cigarettes. He has a 20 pack-year smoking history. He has never used smokeless tobacco. He reports that  drinks alcohol. He reports that he does not use illicit drugs. family history includes Heart disease in his father and mother and Hypertension in his father. No Known Allergies    Review of Systems  Constitutional: Negative for fever and chills.  HENT: Negative for congestion.   Eyes: Positive for discharge, redness and itching. Negative for pain and visual disturbance.  Respiratory: Negative for cough and shortness of breath.   Neurological: Negative for headaches.       Objective:   Physical Exam  HENT:  Right Ear: External ear normal.  Left Ear: External ear normal.  Mouth/Throat: Oropharynx is clear and moist.  Eyes:  Patient has some yellow crusted drainage mostly lower lids bilaterally to lesser extent upper lids. Minimal erythema conjunctiva bilaterally. Corneas are normal bilaterally.  Question mild scaling of lid margins  Cardiovascular: Normal rate and regular rhythm.    Pulmonary/Chest: Effort normal and breath sounds normal. No respiratory distress. He has no wheezes. He has no rales.          Assessment & Plan:  Bilateral eye irritation. Suspect component of blepharitis. Cannot rule out early bacterial conjunctivitis. Tobramycin eye solution every 4 hours both eyes and warm compresses several times daily. Consider gentle cleansing with Johnson's baby shampoo daily

## 2012-09-23 ENCOUNTER — Telehealth: Payer: Self-pay | Admitting: Internal Medicine

## 2012-09-23 NOTE — Telephone Encounter (Signed)
Per dr Lovell Sheehan- kidney function test are worsening with lower hbg-  May need dialysis--called daughter and told her this and she had already called nephrologist who had already told her the same

## 2012-09-23 NOTE — Telephone Encounter (Signed)
Daughter states she is with her dad today, and he is just really, really fatigued. Has no energy whatsoever. Sleepy & feels weak. She wants to know if he can see Dr Lovell Sheehan today. I explained he did not have an opening and she asked me to send this to you to ask Dr Lovell Sheehan' advisement.

## 2012-09-24 ENCOUNTER — Encounter (HOSPITAL_COMMUNITY)
Admission: RE | Admit: 2012-09-24 | Discharge: 2012-09-24 | Disposition: A | Discharge status: Home / Self Care | Payer: Medicare Other | Source: Ambulatory Visit | Attending: Nephrology | Admitting: Nephrology

## 2012-09-24 LAB — POCT HEMOGLOBIN-HEMACUE: Hemoglobin: 9.7 g/dL — ABNORMAL LOW (ref 13.0–17.0)

## 2012-09-24 MED ORDER — EPOETIN ALFA 10000 UNIT/ML IJ SOLN
INTRAMUSCULAR | Status: AC
  Administered 2012-09-24: 8500 [IU] via SUBCUTANEOUS
  Filled 2012-09-24: qty 1

## 2012-10-01 ENCOUNTER — Telehealth: Payer: Self-pay | Admitting: Internal Medicine

## 2012-10-01 NOTE — Telephone Encounter (Signed)
returned pt call and changed appt from 7.14 to 7.22 per request

## 2012-10-08 ENCOUNTER — Encounter (HOSPITAL_COMMUNITY)
Admission: RE | Admit: 2012-10-08 | Discharge: 2012-10-08 | Disposition: A | Discharge status: Home / Self Care | Payer: Medicare Other | Source: Ambulatory Visit | Attending: Nephrology | Admitting: Nephrology

## 2012-10-08 DIAGNOSIS — N186 End stage renal disease: Secondary | ICD-10-CM | POA: Insufficient documentation

## 2012-10-08 DIAGNOSIS — D631 Anemia in chronic kidney disease: Secondary | ICD-10-CM | POA: Insufficient documentation

## 2012-10-08 DIAGNOSIS — I120 Hypertensive chronic kidney disease with stage 5 chronic kidney disease or end stage renal disease: Secondary | ICD-10-CM | POA: Insufficient documentation

## 2012-10-08 LAB — RENAL FUNCTION PANEL
BUN: 129 mg/dL — ABNORMAL HIGH (ref 6–23)
CO2: 24 mEq/L (ref 19–32)
Chloride: 95 mEq/L — ABNORMAL LOW (ref 96–112)
GFR calc Af Amer: 10 mL/min — ABNORMAL LOW (ref >90)
Glucose, Bld: 116 mg/dL — ABNORMAL HIGH (ref 70–99)
Potassium: 3.4 mEq/L — ABNORMAL LOW (ref 3.5–5.1)

## 2012-10-08 LAB — IRON AND TIBC: Saturation Ratios: 27 % (ref 20–55)

## 2012-10-08 LAB — POCT HEMOGLOBIN-HEMACUE: Hemoglobin: 10.1 g/dL — ABNORMAL LOW (ref 13.0–17.0)

## 2012-10-08 MED ORDER — EPOETIN ALFA 10000 UNIT/ML IJ SOLN
8500.0000 [IU] | INTRAMUSCULAR | Status: DC
  Administered 2012-10-08: 8500 [IU] via SUBCUTANEOUS

## 2012-10-08 MED ORDER — EPOETIN ALFA 10000 UNIT/ML IJ SOLN
INTRAMUSCULAR | Status: AC
  Filled 2012-10-08: qty 1

## 2012-10-21 ENCOUNTER — Other Ambulatory Visit: Payer: Medicare Other | Admitting: Lab

## 2012-10-21 ENCOUNTER — Ambulatory Visit: Payer: Medicare Other | Admitting: Internal Medicine

## 2012-10-22 ENCOUNTER — Encounter (HOSPITAL_COMMUNITY)
Admission: RE | Admit: 2012-10-22 | Discharge: 2012-10-22 | Disposition: A | Discharge status: Home / Self Care | Payer: Medicare Other | Source: Ambulatory Visit | Attending: Nephrology | Admitting: Nephrology

## 2012-10-22 LAB — POCT HEMOGLOBIN-HEMACUE: Hemoglobin: 11.1 g/dL — ABNORMAL LOW (ref 13.0–17.0)

## 2012-10-22 MED ORDER — EPOETIN ALFA 10000 UNIT/ML IJ SOLN
INTRAMUSCULAR | Status: AC
  Administered 2012-10-22: 10000 [IU] via SUBCUTANEOUS
  Filled 2012-10-22: qty 1

## 2012-10-28 ENCOUNTER — Telehealth: Payer: Self-pay | Admitting: Internal Medicine

## 2012-10-28 NOTE — Telephone Encounter (Signed)
pt called to r/s appt due to starting dialysis.Marland KitchenMarland KitchenDone

## 2012-10-29 ENCOUNTER — Ambulatory Visit: Payer: Medicare Other | Admitting: Internal Medicine

## 2012-10-29 ENCOUNTER — Other Ambulatory Visit: Payer: Medicare Other | Admitting: Lab

## 2012-10-29 ENCOUNTER — Telehealth: Payer: Self-pay | Admitting: Internal Medicine

## 2012-10-29 NOTE — Telephone Encounter (Signed)
ERROR

## 2012-10-31 ENCOUNTER — Telehealth: Payer: Self-pay | Admitting: Medical Oncology

## 2012-10-31 NOTE — Telephone Encounter (Signed)
Pt on dialysis with weekly labs done there. Daughter requested lab order be sent to [facility] for labs needed at 8/4 visit. I faxed order for labs to [facility].

## 2012-11-04 ENCOUNTER — Other Ambulatory Visit: Payer: Self-pay | Admitting: Internal Medicine

## 2012-11-04 ENCOUNTER — Telehealth: Payer: Self-pay | Admitting: Internal Medicine

## 2012-11-04 NOTE — Telephone Encounter (Signed)
Nexium is too expensive for pt. Daughter requesting we send in any PPI that is generic equal for Nexium. Please send to: [facility]. 90-day supply if ok.

## 2012-11-05 ENCOUNTER — Encounter (HOSPITAL_COMMUNITY): Payer: Medicare Other

## 2012-11-05 ENCOUNTER — Other Ambulatory Visit: Payer: Self-pay | Admitting: *Deleted

## 2012-11-05 MED ORDER — OMEPRAZOLE 40 MG PO CPDR: 40.0000 mg | DELAYED_RELEASE_CAPSULE | Freq: Every day | ORAL | Status: DC

## 2012-11-05 NOTE — Telephone Encounter (Signed)
May change to   Omeprazole 40 qd

## 2012-11-05 NOTE — Telephone Encounter (Signed)
Talked with daughter adn told her , I will ask dr Lovell Sheehan ins am-Wednesday--fyi=pt is now on dialysis

## 2012-11-08 ENCOUNTER — Other Ambulatory Visit: Payer: Self-pay | Admitting: *Deleted

## 2012-11-08 DIAGNOSIS — D473 Essential (hemorrhagic) thrombocythemia: Secondary | ICD-10-CM

## 2012-11-11 ENCOUNTER — Ambulatory Visit: Payer: Medicare Other | Admitting: Internal Medicine

## 2012-11-11 ENCOUNTER — Other Ambulatory Visit: Payer: Medicare Other | Admitting: Lab

## 2012-11-13 ENCOUNTER — Telehealth: Payer: Self-pay | Admitting: Internal Medicine

## 2012-11-13 NOTE — Telephone Encounter (Signed)
pt daughter called and needed to r/s appt...did not want to sched lab bc pt is having mthly labs with dalysis and will fax over results.

## 2012-11-18 ENCOUNTER — Ambulatory Visit (HOSPITAL_BASED_OUTPATIENT_CLINIC_OR_DEPARTMENT_OTHER): Payer: Medicare Other | Admitting: Internal Medicine

## 2012-11-18 ENCOUNTER — Encounter: Payer: Self-pay | Admitting: Internal Medicine

## 2012-11-18 ENCOUNTER — Telehealth: Payer: Self-pay | Admitting: Internal Medicine

## 2012-11-18 ENCOUNTER — Other Ambulatory Visit: Payer: Self-pay | Admitting: Internal Medicine

## 2012-11-18 VITALS — BP 111/54 | HR 71 | Temp 96.9°F | Resp 20 | Ht 65.0 in | Wt 131.6 lb

## 2012-11-18 DIAGNOSIS — D473 Essential (hemorrhagic) thrombocythemia: Secondary | ICD-10-CM

## 2012-11-18 NOTE — Progress Notes (Signed)
[facility] [phone]   Fax:([phone]  OFFICE PROGRESS NOTE  [name], MD [address] [place] [address]  DIAGNOSIS: Essential thrombocythemia   CURRENT THERAPY: Anagrelide 2.5 mg by mouth daily.  INTERVAL HISTORY: Ryan Arellano 79 y.o. male returns to the clinic today for followup visit accompanied his daughter. The patient is feeling fine today with no specific complaints except for mild fatigue. He started hemodialysis recently under the care of Dr. Eliott Nine. He denied having any significant bleeding issues. He is currently on anagrelide 2.5 mg by mouth daily and tolerating it fairly well. The patient had repeat CBC performed earlier today and is here for evaluation and discussion of his lab results.  MEDICAL HISTORY: Past Medical History  Diagnosis Date  . Pulmonary hypertension   . Atrial fibrillation   . Thrombocytopenia   . Anemia   . Arthritis   . Diverticulitis   . Hypertension   . Renal insufficiency   . Skin lesion of back     ALLERGIES:  has No Known Allergies.  MEDICATIONS:  Current Outpatient Prescriptions  Medication Sig Dispense Refill  . anagrelide (AGRYLIN) 0.5 MG capsule Take 2.5 mg by mouth daily.       Marland Kitchen aspirin 81 MG tablet Take 81 mg by mouth daily.        . calcium acetate (PHOSLO) 667 MG capsule Take 667 mg by mouth 3 (three) times daily with meals.      . carvedilol (COREG) 25 MG tablet 1.5 twice a day      . cloNIDine (CATAPRES) 0.2 MG tablet TAKE 1 TABLET 3 TIMES A DAY  90 tablet  6  . colchicine 0.6 MG tablet Take 1 tablet (0.6 mg total) by mouth 2 (two) times daily.  60 tablet  1  . doxazosin (CARDURA) 1 MG tablet TAKE 1 TABLET AT BEDTIME  30 tablet  11  . febuxostat (ULORIC) 40 MG tablet Take 40 mg by mouth daily.       . folic acid (FOLVITE) 1 MG tablet TAKE 1 TABLET TWICE A DAY  180 tablet  2  . hydrALAZINE (APRESOLINE) 50 MG tablet Take 0.5 tablets (25 mg total) by mouth 3  (three) times daily. With the clonidine  45 tablet  3  . lidocaine (LIDODERM) 5 % Place 3 patches onto the skin daily.  90 patch  4  . omeprazole (PRILOSEC) 40 MG capsule Take 1 capsule (40 mg total) by mouth daily.  90 capsule  3  . sodium bicarbonate 650 MG tablet Take 650 mg by mouth 3 (three) times daily.      Marland Kitchen tobramycin (TOBREX) 0.3 % ophthalmic solution Place 1 drop into both eyes every 4 (four) hours.  5 mL  1  . traMADol (ULTRAM) 50 MG tablet TAKE 1 TABLET BY MOUTH EVERY 6 HOURS AS NEEDED FOR PAIN  100 tablet  2  . UNABLE TO FIND Pt unsure of name, it is a topical cream that pt puts on foot for arthritis      . vitamin C (ASCORBIC ACID) 250 MG tablet Take 250 mg by mouth daily.       No current facility-administered medications for this visit.    SURGICAL HISTORY:  Past Surgical History  Procedure Laterality Date  . Ruptured colon  1980  . S/p colectomy  1980  . Tonsillectomy    . Av fistula placement  2002    left arm  REVIEW OF SYSTEMS:  A comprehensive review of systems was negative except for: Constitutional: positive for fatigue   PHYSICAL EXAMINATION: General appearance: alert, cooperative, fatigued and no distress Head: Normocephalic, without obvious abnormality, atraumatic Neck: no adenopathy Lymph nodes: Cervical, supraclavicular, and axillary nodes normal. Resp: clear to auscultation bilaterally Cardio: regular rate and rhythm, S1, S2 normal, no murmur, click, rub or gallop GI: soft, non-tender; bowel sounds normal; no masses,  no organomegaly Extremities: extremities normal, atraumatic, no cyanosis or edema  ECOG PERFORMANCE STATUS: 2 - Symptomatic, <50% confined to bed  Blood pressure 111/54, pulse 71, temperature 96.9 F (36.1 C), temperature source Oral, resp. rate 20, height 5\' 5"  (1.651 m), weight 131 lb 9.6 oz (59.693 kg).  LABORATORY DATA: Lab Results  Component Value Date   WBC 9.0 07/22/2012   HGB 11.1* 10/22/2012   HCT 32.8* 07/22/2012   MCV  87.5 07/22/2012   PLT 710* 07/22/2012      Chemistry      Component Value Date/Time   NA 138 10/08/2012 1320   NA 140 07/22/2012 1246   K 3.4* 10/08/2012 1320   K 3.6 07/22/2012 1246   CL 95* 10/08/2012 1320   CL 106 07/22/2012 1246   CO2 24 10/08/2012 1320   CO2 17* 07/22/2012 1246   BUN 129* 10/08/2012 1320   BUN 117.3* 07/22/2012 1246   CREATININE 5.40* 10/08/2012 1320   CREATININE 5.3 Repeated and Verified* 07/22/2012 1246      Component Value Date/Time   CALCIUM 8.9 10/08/2012 1320   CALCIUM 7.9* 07/22/2012 1246   CALCIUM 9.0 02/15/2012 1258   ALKPHOS 113 07/22/2012 1246   ALKPHOS 116 10/11/2011 1506   AST 16 07/22/2012 1246   AST 18 10/11/2011 1506   ALT 11 07/22/2012 1246   ALT 15 10/11/2011 1506   BILITOT 0.56 07/22/2012 1246   BILITOT 0.3 10/11/2011 1506       RADIOGRAPHIC STUDIES: No results found.  ASSESSMENT AND PLAN: This is a very pleasant 79 years old white male with history of essential thrombocythemia currently on anagrelide 2.5 mg by mouth daily and the last CBC performed at at the dialysis center showed platelets count of 220,000.   I recommended for the patient to continue anagrelide but I would reduce the dose to 2.0 mg by mouth daily. He'll continue to have repeat CBC as scheduled at the dialysis center. I will see the patient back for followup visit in 3 months for reevaluation with repeat CBC. He was advised to call immediately if he has any concerning symptoms or significant abnormality in his platelet count.  The patient voices understanding of current disease status and treatment options and is in agreement with the current care plan.  All questions were answered. The patient knows to call the clinic with any problems, questions or concerns. We can certainly see the patient much sooner if necessary.

## 2012-11-18 NOTE — Telephone Encounter (Signed)
gv and printed appt sched and avs for pt....pt has labs at [facility] and they get faxed over...ok per Sedalia Muta

## 2012-11-18 NOTE — Patient Instructions (Signed)
Continue anagrelide but will reduce the dose to 2.0 mg by mouth daily Followup visit in 3 months

## 2012-11-19 ENCOUNTER — Encounter: Payer: Self-pay | Admitting: Medical Oncology

## 2012-11-22 ENCOUNTER — Other Ambulatory Visit: Payer: Self-pay | Admitting: Internal Medicine

## 2012-11-22 ENCOUNTER — Telehealth: Payer: Self-pay | Admitting: Internal Medicine

## 2012-11-22 MED ORDER — TAPENTADOL HCL ER 50 MG PO TB12: 75.0000 mg | ORAL_TABLET | Freq: Two times a day (BID) | ORAL | Status: DC

## 2012-11-22 MED ORDER — GABAPENTIN 100 MG PO CAPS: 100.0000 mg | ORAL_CAPSULE | Freq: Three times a day (TID) | ORAL | Status: DC

## 2012-11-22 NOTE — Telephone Encounter (Signed)
May have neurontin 100 tid per dr Lovell Sheehan

## 2012-11-22 NOTE — Telephone Encounter (Signed)
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 Pt went on dialysis 3 wks ago. Daughter states the tramadol and lidocaine patch are not helping at all.> Pls advise. Pharm: [facility]

## 2012-11-22 NOTE — Telephone Encounter (Signed)
Per dr Lovell Sheehan try nucynta xr 75 bid

## 2012-11-22 NOTE — Telephone Encounter (Signed)
Per dr Lovell Sheehan- change to nucynta 50 bid-ready for pick up

## 2012-11-22 NOTE — Telephone Encounter (Signed)
PT unable to take gabapentin (NEURONTIN) 100 MG capsule, due to his kidney disease. He is requesting another medication to replace it. Please assist.

## 2012-11-22 NOTE — Telephone Encounter (Signed)
Sister Lupita Leash) calling back. She is requesting this be done today, and she would like a call back at [phone] to know when it is done.

## 2012-11-25 ENCOUNTER — Telehealth: Payer: Self-pay | Admitting: Internal Medicine

## 2012-11-25 NOTE — Telephone Encounter (Signed)
[facility] Triage Call Report Triage Record Num: [id number] Operator: Claudie Leach Patient Name: Shaft Corigliano Call Date & Time: 11/22/2012 7:19:49PM Patient Phone: [phone] PCP: Darryll Capers Patient Gender: Male PCP Fax : [phone] Patient DOB: [birth year] Practice Name: [facility] Reason for Call: Caller: Lowell Guitar; PCP: Darryll Capers (Adults only); CB#: [phone]; Call regarding Medication Issue; Medication(s): ; Caller states the patient was prescribed Nucynta ER 75 mg and they went to the pharmacy to pick it up and it only comes in 50 mg and 100 mg. Verified in EPIC order to change to 50 mg BID. [phone] and she stated she would need a written prescription. Notified Dr. Beverely Low and orders given to call the pharmacy and ask them to change the sig on the prescription to 50 mg BID of Nucynta. Dispense 60 with no refills. Called the pharmacist again and she stated she would change the sig on the prescription when the family brings it back in. Caller made aware and states she will take the prescription to the pharmacy. Note sent to the office. Protocol(s) Used: Office Note Recommended Outcome per Protocol: Information Noted and Sent to Office Reason for Outcome: Caller information to office Care Advice: ~ 08/

## 2012-11-25 NOTE — Telephone Encounter (Signed)
[facility] Triage Call Report Triage Record Num: [id number] Operator: Claudie Leach Patient Name: Ryan Arellano Call Date & Time: 11/22/2012 8:45:13PM Patient Phone: [phone] PCP: Darryll Capers Patient Gender: Male PCP Fax : [phone] Patient DOB: 1921-09-22 Practice Name: [facility] Reason for Call: Caller: Leslie/Other; PCP: Darryll Capers (Adults only); CB#: [phone]; Call regarding caller called back and stated the insurance will cover the Nucynta ER 50 mg but not the plain Nucynta. States the Nucynta 50 mg plain will be $176.00. Notified Dr. Beverely Low and order given to change the Nucynta to the ER 50 mg BID. Dispense 60 with no refills. [facility] pharmacist at [phone] made aware of order change. Note sent to the office. Protocol(s) Used: Office Note Recommended Outcome per Protocol: Information Noted and Sent to Office Reason for Outcome: Caller information to office Care Advice: ~ 08/

## 2012-11-26 ENCOUNTER — Other Ambulatory Visit: Payer: Self-pay | Admitting: *Deleted

## 2012-11-26 DIAGNOSIS — B0229 Other postherpetic nervous system involvement: Secondary | ICD-10-CM

## 2012-11-26 NOTE — Telephone Encounter (Signed)
Pt is still having pain. Moaning and groaning all night. Really bad. Also having dialysis.  They have been giving everything they can and tried it all. Increase dose? If there is nothing else, daughter would like to know if pt could get referral to pain management clinic. pls advise.

## 2012-11-26 NOTE — Telephone Encounter (Signed)
Ok to send for pain clinic , maybe capsasin ointment.per dr Lovell Sheehan

## 2012-11-27 ENCOUNTER — Other Ambulatory Visit: Payer: Self-pay | Admitting: *Deleted

## 2012-11-29 ENCOUNTER — Encounter: Payer: Self-pay | Admitting: Family

## 2012-11-29 ENCOUNTER — Ambulatory Visit (INDEPENDENT_AMBULATORY_CARE_PROVIDER_SITE_OTHER): Payer: Medicare Other | Admitting: Family

## 2012-11-29 VITALS — BP 120/60 | HR 67 | Wt 129.0 lb

## 2012-11-29 DIAGNOSIS — T148XXA Other injury of unspecified body region, initial encounter: Secondary | ICD-10-CM

## 2012-11-29 DIAGNOSIS — IMO0002 Reserved for concepts with insufficient information to code with codable children: Secondary | ICD-10-CM

## 2012-11-29 NOTE — Patient Instructions (Signed)
Skin Tear Care  A skin tear is a wound in which the top layer of skin has peeled off. This is a common problem with aging because the skin becomes thinner and more fragile as a person gets older. In addition, some medicines, such as oral corticosteroids, can lead to skin thinning if taken for long periods of time.   A skin tear is often repaired with tape or skin adhesive strips. This keeps the skin that has been peeled off in contact with the healthier skin beneath. Depending on the location of the wound, a bandage (dressing) may be applied over the tape or skin adhesive strips. Sometimes, during the healing process, the skin turns black and dies. Even when this happens, the torn skin acts as a good dressing until the skin underneath gets healthier and repairs itself.  HOME CARE INSTRUCTIONS   · Change dressings once per day or as directed by your caregiver.  · Gently clean the skin tear and the area around the tear using saline solution or mild soap and water.  · Do not rub the injured skin dry. Let the area air dry.  · Apply petroleum jelly or an antibiotic cream or ointment to keep the tear moist. This will help the wound heal. Do not allow a scab to form.  · If the dressing sticks before the next dressing change, moisten it with warm soapy water and gently remove it.  · Protect the injured skin until it has healed.  · Only take over-the-counter or prescription medicines as directed by your caregiver.  · Take showers or baths using warm soapy water. Apply a new dressing after the shower or bath.  · Keep all follow-up appointments as directed by your caregiver.    SEEK IMMEDIATE MEDICAL CARE IF:   · You have redness, swelling, or increasing pain in the skin tear.  · You have pus coming from the skin tear.  · You have chills.  · You have a red streak that goes away from the skin tear.  · You have a bad smell coming from the tear or dressing.  · You have a fever or persistent symptoms for more than 2 3 days.  · You  have a fever and your symptoms suddenly get worse.  MAKE SURE YOU:  · Understand these instructions.  · Will watch this condition.  · Will get help right away if your child is not doing well or gets worse.  Document Released: 12/20/2000 Document Revised: 12/20/2011 Document Reviewed: 10/09/2011  [employer]® Patient Information ©2014 [employer].

## 2012-11-29 NOTE — Progress Notes (Signed)
  Subjective:    Patient ID: Ryan Arellano, male    DOB: 10/01/1921, 79 y.o.   MRN: [id number]  HPI  79 year old white male, not, patient of Dr. Lovell Sheehan is in today with a skin tear after removed a Lidocaine patch 4 days ago. Denies any discharge. No pain.   Review of Systems  Constitutional: Negative.   Respiratory: Negative.   Skin: Positive for wound.       Skin tear left upper back.   Psychiatric/Behavioral: Negative.        Objective:   Physical Exam  Constitutional: He is oriented to person, place, and time. He appears well-developed and well-nourished.  Neck: Normal range of motion. Neck supple.  Cardiovascular: Normal rate, regular rhythm and normal heart sounds.   Pulmonary/Chest: Effort normal and breath sounds normal.  Neurological: He is alert and oriented to person, place, and time.  Skin: Skin is warm and dry.     Psychiatric: He has a normal mood and affect.          Assessment & Plan:  Assessment:  1. Skin tear   Plan: Apply triple antibiotic ointment twice a day. Call the office with any questions or concerns. Discussed s/s of infection.

## 2012-12-02 ENCOUNTER — Telehealth: Payer: Self-pay | Admitting: Internal Medicine

## 2012-12-02 NOTE — Telephone Encounter (Signed)
[facility] Triage Call Report Triage Record Num: [id number] Operator: Jeraldine Loots Patient Name: Ryan Arellano Call Date & Time: 12/01/2012 12:36:09PM Patient Phone: [phone] PCP: Darryll Capers Patient Gender: Male PCP Fax : [phone] Patient DOB: [birth year] [facility] Name[facility] Reason for Call: Caller: Leslie/Other; PCP: Darryll Capers (Adults only); CB#: [phone]; Call regarding Shingles pain. It has worsened in the past 2 weeks. He started the medication Aug 15 and started dialysis around the same time. Has noticed that since starting the dialysis 2 weeks ago. He is moaning and in constant pain 24/7. He is on Nucynta 50mg  1 po tid with no relief. Using Lidoderm patches, ice packs, heat and nothing seems to help. During dialysis, he has no pain but once he is off the dialysis the pain resumes. She wants to add an OTC, will offer 1 Tylenol po now to see if that will boost the effect of the medication. Protocol(s) Used: Skin Lesions Recommended Outcome per Protocol: Call Provider within 72 Hours Reason for Outcome: Evaluated by provider and has question/concern about their condition, treatment plan, treatment options, follow-up appointments, or other follow-up care. Care Advice: ~ 08/

## 2012-12-03 ENCOUNTER — Telehealth: Payer: Self-pay | Admitting: Internal Medicine

## 2012-12-03 ENCOUNTER — Other Ambulatory Visit: Payer: Self-pay | Admitting: Internal Medicine

## 2012-12-03 NOTE — Telephone Encounter (Signed)
Per dr Lovell Sheehan- try ultram 50 qid , if doesn't help call back tomorrow and will give oxycontin 20 bid0daughter informed

## 2012-12-03 NOTE — Telephone Encounter (Signed)
The Tapentadol HCl (NUCYNTA ER) 50 MG TB12 3 x day along w/ 500 mg tylenol is not working for pain. Pt is still moaning, and moaning and not sleeping. Daughter states he needs something else or go to the hospital. This has been going on 2 1/2 weeks. pls advise.

## 2012-12-04 ENCOUNTER — Telehealth: Payer: Self-pay | Admitting: Internal Medicine

## 2012-12-04 NOTE — Telephone Encounter (Signed)
PT daughter called to request a refill of the pt's Tapentadol HCl (NUCYNTA ER) 50 MG TB12. Please assist.

## 2012-12-05 ENCOUNTER — Other Ambulatory Visit: Payer: Self-pay | Admitting: *Deleted

## 2012-12-05 MED ORDER — TAPENTADOL HCL ER 50 MG PO TB12: 50.0000 mg | ORAL_TABLET | Freq: Three times a day (TID) | ORAL | Status: DC

## 2012-12-30 ENCOUNTER — Encounter: Payer: Self-pay | Admitting: Internal Medicine

## 2012-12-30 ENCOUNTER — Ambulatory Visit (INDEPENDENT_AMBULATORY_CARE_PROVIDER_SITE_OTHER): Payer: Medicare Other | Admitting: Internal Medicine

## 2012-12-30 VITALS — BP 100/60 | HR 76 | Temp 98.2°F | Resp 18 | Ht 65.0 in

## 2012-12-30 DIAGNOSIS — G5790 Unspecified mononeuropathy of unspecified lower limb: Secondary | ICD-10-CM

## 2012-12-30 DIAGNOSIS — N185 Chronic kidney disease, stage 5: Secondary | ICD-10-CM

## 2012-12-30 DIAGNOSIS — G894 Chronic pain syndrome: Secondary | ICD-10-CM

## 2012-12-30 DIAGNOSIS — M792 Neuralgia and neuritis, unspecified: Secondary | ICD-10-CM

## 2012-12-30 MED ORDER — BUTALBITAL-APAP-CAFFEINE 50-325-40 MG PO TABS: 1.0000 | ORAL_TABLET | Freq: Four times a day (QID) | ORAL | Status: DC | PRN

## 2012-12-30 NOTE — Progress Notes (Signed)
Subjective:    Patient ID: Ryan Arellano, male    DOB: 1922-02-21, 79 y.o.   MRN: [id number]  HPI Had dialysis on Saturday. Was up off an on with pain last nght Increasing fatigue and mobility transfer issues Chronic pain worsened fatigue Sleep depravation due to pain     Review of Systems  Constitutional: Positive for fever and fatigue.  HENT: Negative for hearing loss, congestion, neck pain and postnasal drip.   Eyes: Negative for discharge, redness and visual disturbance.  Respiratory: Negative for cough, shortness of breath and wheezing.   Cardiovascular: Positive for leg swelling.  Gastrointestinal: Negative for abdominal pain, constipation and abdominal distention.  Genitourinary: Negative for urgency and frequency.  Musculoskeletal: Positive for gait problem. Negative for joint swelling and arthralgias.  Skin: Negative for color change and rash.  Neurological: Positive for dizziness and weakness. Negative for light-headedness.  Hematological: Negative for adenopathy.  Psychiatric/Behavioral: Positive for decreased concentration. Negative for behavioral problems.   Past Medical History  Diagnosis Date  . Pulmonary hypertension   . Atrial fibrillation   . Thrombocytopenia   . Anemia   . Arthritis   . Diverticulitis   . Hypertension   . Renal insufficiency   . Skin lesion of back     History   Social History  . Marital Status: Widowed    Spouse Name: N/A    Number of Children: N/A  . Years of Education: N/A   Occupational History  . retired    Social History Main Topics  . Smoking status: Former Smoker -- 0.50 packs/day for 40 years    Types: Cigarettes    Quit date: 04/11/1979  . Smokeless tobacco: Never Used  . Alcohol Use: Yes     Comment: once per week  . Drug Use: No  . Sexual Activity: No   Other Topics Concern  . Not on file   Social History Narrative  . No narrative on file    Past Surgical History  Procedure Laterality Date  .  Ruptured colon  1980  . S/p colectomy  1980  . Tonsillectomy    . Av fistula placement  2002    left arm    Family History  Problem Relation Age of Onset  . Heart disease Father   . Hypertension Father   . Heart disease Mother     No Known Allergies  Current Outpatient Prescriptions on File Prior to Visit  Medication Sig Dispense Refill  . anagrelide (AGRYLIN) 0.5 MG capsule Take 2.5 mg by mouth daily. Changed on 11/18/12 to 2 mg /day      . aspirin 81 MG tablet Take 81 mg by mouth daily.        . calcium acetate (PHOSLO) 667 MG capsule Take 667 mg by mouth 3 (three) times daily with meals.      . carvedilol (COREG) 25 MG tablet 1.5 twice a day      . cloNIDine (CATAPRES) 0.2 MG tablet TAKE 1 TABLET 3 TIMES A DAY  90 tablet  6  . colchicine 0.6 MG tablet Take 1 tablet (0.6 mg total) by mouth 2 (two) times daily.  60 tablet  1  . doxazosin (CARDURA) 1 MG tablet TAKE 1 TABLET AT BEDTIME  30 tablet  11  . febuxostat (ULORIC) 40 MG tablet Take 40 mg by mouth daily.       . folic acid (FOLVITE) 1 MG tablet TAKE 1 TABLET TWICE A DAY  180 tablet  2  .  hydrALAZINE (APRESOLINE) 50 MG tablet Take 0.5 tablets (25 mg total) by mouth 3 (three) times daily. With the clonidine  45 tablet  3  . lidocaine (LIDODERM) 5 % APPLY 3 PATCHES TO THE SKIN ONCE DAILY  90 patch  4  . omeprazole (PRILOSEC) 40 MG capsule Take 1 capsule (40 mg total) by mouth daily.  90 capsule  3  . UNABLE TO FIND Pt unsure of name, it is a topical cream that pt puts on foot for arthritis       No current facility-administered medications on file prior to visit.    BP 100/60  Pulse 76  Temp(Src) 98.2 F (36.8 C)  Resp 18  Ht 5\' 5"  (1.651 m)       Objective:   Physical Exam  Constitutional:  Pale. somnolent  HENT:  Head: Normocephalic and atraumatic.  Eyes: Conjunctivae are normal. Pupils are equal, round, and reactive to light.  Neck: Normal range of motion. Neck supple.  Cardiovascular: Normal rate and  regular rhythm.   Murmur heard. Pulmonary/Chest: Effort normal and breath sounds normal.  Abdominal: Soft. Bowel sounds are normal.  Musculoskeletal: He exhibits edema.  Skin: There is pallor.          Assessment & Plan:  esgic plus for pain ( cannot tolerate narcotics, ultram or nucynta) Increased FFT on dialysis Sleep depravation due to awakening with pain bu narcotics caused excessive somnolence Discussion of palliative care with daughters

## 2012-12-30 NOTE — Patient Instructions (Signed)
The patient is instructed to continue all medications as prescribed. Schedule followup with check out clerk upon leaving the clinic  

## 2013-01-20 ENCOUNTER — Telehealth: Payer: Self-pay | Admitting: Internal Medicine

## 2013-01-20 NOTE — Telephone Encounter (Signed)
It is ok per dr Lovell Sheehan

## 2013-01-20 NOTE — Telephone Encounter (Signed)
Pt daughter is concerned that the pt wants cheese and crackers constantly. She states that every 2 hours, that's what he is craving, all day, everyday. Please advise.

## 2013-01-22 ENCOUNTER — Encounter (HOSPITAL_COMMUNITY): Payer: Self-pay | Admitting: Emergency Medicine

## 2013-01-22 ENCOUNTER — Telehealth: Payer: Self-pay | Admitting: Internal Medicine

## 2013-01-22 ENCOUNTER — Emergency Department (HOSPITAL_COMMUNITY): Payer: Medicare Other

## 2013-01-22 ENCOUNTER — Emergency Department (HOSPITAL_COMMUNITY)
Admission: EM | Admit: 2013-01-22 | Discharge: 2013-01-22 | Disposition: A | Discharge status: Home / Self Care | Payer: Medicare Other | Attending: Emergency Medicine | Admitting: Emergency Medicine

## 2013-01-22 DIAGNOSIS — I4891 Unspecified atrial fibrillation: Secondary | ICD-10-CM | POA: Insufficient documentation

## 2013-01-22 DIAGNOSIS — M129 Arthropathy, unspecified: Secondary | ICD-10-CM | POA: Insufficient documentation

## 2013-01-22 DIAGNOSIS — Z872 Personal history of diseases of the skin and subcutaneous tissue: Secondary | ICD-10-CM | POA: Insufficient documentation

## 2013-01-22 DIAGNOSIS — Y9389 Activity, other specified: Secondary | ICD-10-CM | POA: Insufficient documentation

## 2013-01-22 DIAGNOSIS — IMO0002 Reserved for concepts with insufficient information to code with codable children: Secondary | ICD-10-CM | POA: Insufficient documentation

## 2013-01-22 DIAGNOSIS — I120 Hypertensive chronic kidney disease with stage 5 chronic kidney disease or end stage renal disease: Secondary | ICD-10-CM | POA: Insufficient documentation

## 2013-01-22 DIAGNOSIS — Z79899 Other long term (current) drug therapy: Secondary | ICD-10-CM | POA: Insufficient documentation

## 2013-01-22 DIAGNOSIS — Z7982 Long term (current) use of aspirin: Secondary | ICD-10-CM | POA: Insufficient documentation

## 2013-01-22 DIAGNOSIS — Y9289 Other specified places as the place of occurrence of the external cause: Secondary | ICD-10-CM | POA: Insufficient documentation

## 2013-01-22 DIAGNOSIS — Z87891 Personal history of nicotine dependence: Secondary | ICD-10-CM | POA: Insufficient documentation

## 2013-01-22 DIAGNOSIS — Z862 Personal history of diseases of the blood and blood-forming organs and certain disorders involving the immune mechanism: Secondary | ICD-10-CM | POA: Insufficient documentation

## 2013-01-22 DIAGNOSIS — S32000A Wedge compression fracture of unspecified lumbar vertebra, initial encounter for closed fracture: Secondary | ICD-10-CM

## 2013-01-22 DIAGNOSIS — Z8719 Personal history of other diseases of the digestive system: Secondary | ICD-10-CM | POA: Insufficient documentation

## 2013-01-22 DIAGNOSIS — R296 Repeated falls: Secondary | ICD-10-CM | POA: Insufficient documentation

## 2013-01-22 DIAGNOSIS — S32009A Unspecified fracture of unspecified lumbar vertebra, initial encounter for closed fracture: Secondary | ICD-10-CM | POA: Insufficient documentation

## 2013-01-22 DIAGNOSIS — N186 End stage renal disease: Secondary | ICD-10-CM | POA: Insufficient documentation

## 2013-01-22 DIAGNOSIS — Z992 Dependence on renal dialysis: Secondary | ICD-10-CM | POA: Insufficient documentation

## 2013-01-22 DIAGNOSIS — L089 Local infection of the skin and subcutaneous tissue, unspecified: Secondary | ICD-10-CM | POA: Insufficient documentation

## 2013-01-22 MED ORDER — OXYCODONE-ACETAMINOPHEN 5-325 MG PO TABS
0.5000 | ORAL_TABLET | Freq: Once | ORAL | Status: AC
  Administered 2013-01-22: 0.5 via ORAL
  Filled 2013-01-22: qty 1

## 2013-01-22 NOTE — Telephone Encounter (Signed)
Patient Information:  Caller Name: Verlon Au  Phone: [phone]  Patient: Ryan Arellano, Ryan Arellano  Gender: Male  DOB: 03/06/1922  Age: 79 Years  PCP: Darryll Capers (Adults only)  Office Follow Up:  Does the office need to follow up with this patient?: Yes  Instructions For The Office: Office, please follow up with caller if xrays can be ordered for pt who is complaining of back pain following a fall on 01/21/13 (alternate number given:  [phone])  RN Note:  Lupita Leash did not want pt to come into the office but wants to be to have xrays done.  Symptoms  Reason For Call & Symptoms: caller reports pt has fallen in Dialysis yesterday.  (01/21/13).  Caller states that pt had a rough night with pain.  She is wanting to get pt xrayed  Reviewed Health History In EMR: Yes  Reviewed Medications In EMR: Yes  Reviewed Allergies In EMR: Yes  Reviewed Surgeries / Procedures: Yes  Date of Onset of Symptoms: 01/21/2013  Guideline(s) Used:  Back Injury  Disposition Per Guideline:   See Today in Office  Reason For Disposition Reached:   Patient wants to be seen  Advice Given:  N/A  Patient Refused Recommendation:  Patient Refused Care Advice  Lupita Leash (daughter) is requesting for pt to have xrays done of back.  RN offered pt appt at the office but she refused, stating she just wanted to have pt xrayed

## 2013-01-22 NOTE — Telephone Encounter (Signed)
Per dr Lovell Sheehan- go to er so xrays can be done- daughter informed

## 2013-01-22 NOTE — ED Notes (Signed)
Pt/family reports patient fell yesterday around 1215 following dialysis. Pt denies LOC or hitting his head. Pt is A/O and at cognitive baseline per family.

## 2013-01-22 NOTE — ED Provider Notes (Addendum)
CSN: [id number]     Arrival date & time 01/22/13  1532 History   First MD Initiated Contact with Patient 01/22/13 1605     Chief Complaint  Patient presents with  . Back Pain   (Consider location/radiation/quality/duration/timing/severity/associated sxs/prior Treatment) Patient is a 79 y.o. male presenting with fall. The history is provided by the patient.  Fall This is a new problem. The current episode started yesterday. The problem occurs constantly. The problem has been gradually worsening. Pertinent negatives include no abdominal pain and no headaches. Associated symptoms comments: Was in the bathroom at dialysis and lost his balance and fell on his back and left elbow.  Initially felt ok but as time has gone on more pain in the lower back. The symptoms are aggravated by standing and walking. Relieved by: some improvement with oxycodone. He has tried acetaminophen for the symptoms. The treatment provided mild relief.    Past Medical History  Diagnosis Date  . Pulmonary hypertension   . Atrial fibrillation   . Thrombocytopenia   . Anemia   . Arthritis   . Diverticulitis   . Hypertension   . Renal insufficiency   . Skin lesion of back    Past Surgical History  Procedure Laterality Date  . Ruptured colon  1980  . S/p colectomy  1980  . Tonsillectomy    . Av fistula placement  2002    left arm   Family History  Problem Relation Age of Onset  . Heart disease Father   . Hypertension Father   . Heart disease Mother    History  Substance Use Topics  . Smoking status: Former Smoker -- 0.50 packs/day for 40 years    Types: Cigarettes    Quit date: 04/11/1979  . Smokeless tobacco: Never Used  . Alcohol Use: Yes     Comment: once per week    Review of Systems  Constitutional: Negative for fever.  Gastrointestinal: Negative for nausea, vomiting and abdominal pain.  Neurological: Negative for weakness and headaches.       No LOC  Psychiatric/Behavioral: Negative for  confusion.  All other systems reviewed and are negative.    Allergies  Review of patient's allergies indicates no known allergies.  Home Medications   Current Outpatient Rx  Name  Route  Sig  Dispense  Refill  . anagrelide (AGRYLIN) 0.5 MG capsule   Oral   Take 2.5 mg by mouth daily. Changed on 11/18/12 to 2 mg /day         . aspirin 81 MG tablet   Oral   Take 81 mg by mouth daily.           . calcium acetate (PHOSLO) 667 MG capsule   Oral   Take 667 mg by mouth 3 (three) times daily with meals.         . carvedilol (COREG) 25 MG tablet      1.5 twice a day         . febuxostat (ULORIC) 40 MG tablet   Oral   Take 40 mg by mouth daily.          . pregabalin (LYRICA) 50 MG capsule   Oral   Take 50 mg by mouth at bedtime.         Marland Kitchen UNABLE TO FIND      Pt unsure of name, it is a topical cream that pt puts on foot for arthritis          BP 122/63  Pulse 68  Temp(Src) 98.1 F (36.7 C) (Oral)  Resp 20  SpO2 93% Physical Exam  Nursing note and vitals reviewed. Constitutional: He is oriented to person, place, and time. He appears well-developed and well-nourished. No distress.  HENT:  Head: Normocephalic and atraumatic.  Mouth/Throat: Oropharynx is clear and moist.  Eyes: Conjunctivae and EOM are normal. Pupils are equal, round, and reactive to light.  Neck: Normal range of motion. Neck supple. No spinous process tenderness and no muscular tenderness present.  Cardiovascular: Normal rate, regular rhythm and intact distal pulses.   No murmur heard. Pulmonary/Chest: Effort normal and breath sounds normal. No respiratory distress. He has no wheezes. He has no rales.  Abdominal: Soft. He exhibits no distension. There is no tenderness. There is no rebound and no guarding.  Musculoskeletal: Normal range of motion. He exhibits no edema.       Right hip: Normal.       Left hip: Normal.       Right knee: Normal.       Left knee: Normal.       Lumbar back: He  exhibits tenderness, bony tenderness and pain. He exhibits no deformity and normal pulse.       Back:  Fistula in the left upper arm with palpable thrill.  Small abrasion to the elbow  Neurological: He is alert and oriented to person, place, and time.  Skin: Skin is warm and dry. No rash noted. No erythema.  Psychiatric: He has a normal mood and affect. His behavior is normal.    ED Course  Procedures (including critical care time) Labs Review Labs Reviewed - No data to display Imaging Review Dg Lumbar Spine Complete  01/22/2013   CLINICAL DATA:  Initial encounter for current episode of low back pain. No known injuries.  EXAM: LUMBAR SPINE - COMPLETE 4+ VIEW  COMPARISON:  None.  FINDINGS: 5 non rib-bearing lumbar vertebrae with anatomic alignment. Mild compression fracture of the anterior superior endplate of L1 on the order of 20% or so, age indeterminate. No other fractures. Mild osseous demineralization. Moderate to severe disk space narrowing and associated endplate hypertrophic changes at L4-5. Mild disc space narrowing and endplate hypertrophic changes at L2-3 and L3-4. No pars defects. Right-sided facet degenerative changes at L5-S1. Visualized sacroiliac joints intact. Aortoiliac atherosclerosis without aneurysm.  IMPRESSION: 1. Mild compression fracture of the anterior superior endplate of L1 on the order of 20% or so, age indeterminate. This is likely old, but please correlate with point tenderness. 2. Degenerative disk disease and spondylosis at L4-5 (moderate to severe ), L2-3 (mild), and L3-4 (mild).   Electronically Signed   By: Hulan Saas M.D.   On: 01/22/2013 17:35    EKG Interpretation   None       MDM   1. Lumbar compression fracture, closed, initial encounter     Patient with a mechanical fall yesterday at dialysis with c/o of lower back pain.  Mild improvement with oxycodone but pain worse today.  Denies LOC or head injury.  Pt has lower midline lumbar  tenderness.  But otherwise exam is neg.  NO anticoagulation.  Well appearing and normal VS.  No signs of injury to hips, knees, ankles or elbow.  No neck pain.  Plain films pending.  6:01 PM Patient with a possible new mild compression fracture of L1. He does have pain throughout the L-spine is difficult to appreciate if this is new or old. Patient has not had any pain medicine  today and is having difficulty going from lying to sitting due to pain. He was given half a tablet of Percocet which he also has a home and can take when necessary were extra strength Tylenol if the pain is tolerable.  Gwyneth Sprout, MD 01/22/13 4540  Gwyneth Sprout, MD 01/22/13 [phone]

## 2013-01-23 ENCOUNTER — Telehealth: Payer: Self-pay | Admitting: Internal Medicine

## 2013-01-23 NOTE — Telephone Encounter (Signed)
Pt daughter is calling and insisting on speaking with you in regards to putting her father in a nursing home, and the forms that need filled out. She does have them printed off of the computer, she just has a few questions. Please advise.

## 2013-01-24 NOTE — Telephone Encounter (Signed)
Pt daughter dropped off forms for nursing home. Pt daughter would like MD to see her dad for follow up on fall asap.

## 2013-01-27 ENCOUNTER — Encounter: Payer: Self-pay | Admitting: Internal Medicine

## 2013-01-27 ENCOUNTER — Ambulatory Visit (INDEPENDENT_AMBULATORY_CARE_PROVIDER_SITE_OTHER): Payer: Medicare Other | Admitting: Internal Medicine

## 2013-01-27 VITALS — BP 70/40 | HR 62 | Temp 97.5°F | Resp 18

## 2013-01-27 DIAGNOSIS — N186 End stage renal disease: Secondary | ICD-10-CM

## 2013-01-27 DIAGNOSIS — B0229 Other postherpetic nervous system involvement: Secondary | ICD-10-CM

## 2013-01-27 DIAGNOSIS — I10 Essential (primary) hypertension: Secondary | ICD-10-CM

## 2013-01-27 MED ORDER — LINACLOTIDE 290 MCG PO CAPS: 290.0000 ug | ORAL_CAPSULE | Freq: Every day | ORAL | Status: DC

## 2013-01-27 MED ORDER — POLYETHYLENE GLYCOL 3350 17 GM/SCOOP PO POWD: 17.0000 g | Freq: Two times a day (BID) | ORAL | Status: DC | PRN

## 2013-01-27 NOTE — Progress Notes (Signed)
Subjective:    Patient ID: Ryan Arellano, male    DOB: 1921/11/04, 79 y.o.   MRN: [id number]  HPI  79 year old patient who is seen today with a chief complaint of constipation. He has been on oxycodone for a compression fracture as well as postherpetic neuralgia. He is followed by pain management for his chronic pain. He is accompanied by his family who states that he has not had a bowel movement in 5 or 6 days. No nausea or vomiting.  He is on dialysis 3 times weekly. He has atrial fibrillation and hypertension and is on multiple medications.. He has tried stool softeners and laxatives without benefit. There has been some abdominal pain described as cramping  Past Medical History  Diagnosis Date  . Pulmonary hypertension   . Atrial fibrillation   . Thrombocytopenia   . Anemia   . Arthritis   . Diverticulitis   . Hypertension   . Renal insufficiency   . Skin lesion of back     History   Social History  . Marital Status: Widowed    Spouse Name: N/A    Number of Children: N/A  . Years of Education: N/A   Occupational History  . retired    Social History Main Topics  . Smoking status: Former Smoker -- 0.50 packs/day for 40 years    Types: Cigarettes    Quit date: 04/11/1979  . Smokeless tobacco: Never Used  . Alcohol Use: Yes     Comment: once per week  . Drug Use: No  . Sexual Activity: No   Other Topics Concern  . Not on file   Social History Narrative  . No narrative on file    Past Surgical History  Procedure Laterality Date  . Ruptured colon  1980  . S/p colectomy  1980  . Tonsillectomy    . Av fistula placement  2002    left arm    Family History  Problem Relation Age of Onset  . Heart disease Father   . Hypertension Father   . Heart disease Mother     No Known Allergies  Current Outpatient Prescriptions on File Prior to Visit  Medication Sig Dispense Refill  . anagrelide (AGRYLIN) 0.5 MG capsule Take 2.5 mg by mouth 4 (four) times daily.        Marland Kitchen aspirin 81 MG tablet Take 81 mg by mouth daily.        . butalbital-acetaminophen-caffeine (FIORICET) 50-325-40 MG per tablet Take 1 tablet by mouth every 6 (six) hours as needed for pain.      . calcium acetate (PHOSLO) 667 MG capsule Take 667 mg by mouth 3 (three) times daily with meals.      . carvedilol (COREG) 25 MG tablet Take 25 mg by mouth 2 (two) times daily with a meal. SUN, MON, WED, FRI      . cloNIDine (CATAPRES) 0.2 MG tablet Take 0.2 mg by mouth 2 (two) times daily.      . colchicine 0.6 MG tablet Take 0.6 mg by mouth 2 (two) times daily.      Marland Kitchen doxazosin (CARDURA) 1 MG tablet Take 1 mg by mouth daily.      . febuxostat (ULORIC) 40 MG tablet Take 40 mg by mouth daily.       . folic acid (FOLVITE) 1 MG tablet Take 1 mg by mouth 2 (two) times daily.      . hydrALAZINE (APRESOLINE) 25 MG tablet Take 12.5 mg by mouth 3 (  three) times daily.      Marland Kitchen lidocaine (LIDODERM) 5 % Place 1 patch onto the skin daily. Remove & Discard patch within 12 hours or as directed by MD      . omeprazole (PRILOSEC) 40 MG capsule Take 40 mg by mouth daily.      . pregabalin (LYRICA) 50 MG capsule Take 50 mg by mouth 2 (two) times daily.       Marland Kitchen UNABLE TO FIND Pt unsure of name, it is a topical cream that pt puts on foot for arthritis      . vitamin C (ASCORBIC ACID) 250 MG tablet Take 250 mg by mouth daily.       No current facility-administered medications on file prior to visit.    BP 70/40  Pulse 62  Temp(Src) 97.5 F (36.4 C) (Oral)  Resp 18  SpO2 96%        Review of Systems  Constitutional: Positive for fatigue. Negative for fever, chills and appetite change.  HENT: Negative for congestion, dental problem, ear pain, hearing loss, sore throat, tinnitus, trouble swallowing and voice change.   Eyes: Negative for pain, discharge and visual disturbance.  Respiratory: Negative for cough, chest tightness, wheezing and stridor.   Cardiovascular: Negative for chest pain, palpitations and leg  swelling.  Gastrointestinal: Positive for abdominal pain and constipation. Negative for nausea, vomiting, diarrhea, blood in stool and abdominal distention.  Genitourinary: Negative for urgency, hematuria, flank pain, discharge, difficulty urinating and genital sores.  Musculoskeletal: Negative for arthralgias, back pain, gait problem, joint swelling, myalgias and neck stiffness.  Skin: Negative for rash.  Neurological: Positive for weakness. Negative for dizziness, syncope, speech difficulty, numbness and headaches.  Hematological: Negative for adenopathy. Does not bruise/bleed easily.  Psychiatric/Behavioral: Negative for behavioral problems and dysphoric mood. The patient is not nervous/anxious.        Objective:   Physical Exam  Constitutional: He appears well-developed and well-nourished. No distress.  Blood pressure 80/50  Abdominal: Soft. Bowel sounds are normal. There is no tenderness. There is no rebound and no guarding.  Genitourinary:  Large soft fecal impaction          Assessment & Plan:  Fecal impaction.  Will try MiraLax for several hours and if not effective will report to the ED for disimpaction Chronic narcotic use. We'll give samples of linzess Hypotension- Catapres and Coreg dosing reduced by 50%; Cardura and Apresoline were placed on hold.  Recheck one week Will be evaluated at dialysis center tomorrow for further titration

## 2013-01-27 NOTE — Patient Instructions (Addendum)
push fluids Minimize oxycodone use if possible LINZESS  1 daily  MiraLax as discussed Continue stool softener  Decreased blood pressure medications as discussed  Return in one week for followup

## 2013-01-27 NOTE — Telephone Encounter (Signed)
Saw dr Kirtland Bouchard and completed fl2, faxed to [facility] and Left message on machine' For donna that this has been done

## 2013-01-29 ENCOUNTER — Telehealth: Payer: Self-pay | Admitting: Internal Medicine

## 2013-01-29 NOTE — Telephone Encounter (Signed)
Faxed it again

## 2013-01-29 NOTE — Telephone Encounter (Signed)
Ryan Arellano would like to confirm the paperwork was faxed to [facility]. They would like to get pt in asap.

## 2013-01-31 ENCOUNTER — Telehealth: Payer: Self-pay | Admitting: Internal Medicine

## 2013-01-31 NOTE — Telephone Encounter (Signed)
Daughter request rx for muscle relaxer. Pt fell and hurt is back and is in a lot of pain.   Also, they would like to know if there is any way you can expedite the paperwork for pt to go into a facility.  Ms Chulis asked if you could give her a call pls.[phone]

## 2013-01-31 NOTE — Telephone Encounter (Signed)
per dr Lovell Sheehan give lyrica

## 2013-02-12 ENCOUNTER — Ambulatory Visit (INDEPENDENT_AMBULATORY_CARE_PROVIDER_SITE_OTHER): Payer: Medicare Other | Admitting: Internal Medicine

## 2013-02-12 ENCOUNTER — Encounter: Payer: Self-pay | Admitting: Internal Medicine

## 2013-02-12 VITALS — BP 120/60 | HR 94 | Resp 18 | Wt 116.0 lb

## 2013-02-12 DIAGNOSIS — N186 End stage renal disease: Secondary | ICD-10-CM

## 2013-02-12 DIAGNOSIS — I4891 Unspecified atrial fibrillation: Secondary | ICD-10-CM

## 2013-02-12 DIAGNOSIS — I679 Cerebrovascular disease, unspecified: Secondary | ICD-10-CM

## 2013-02-12 DIAGNOSIS — I10 Essential (primary) hypertension: Secondary | ICD-10-CM

## 2013-02-12 NOTE — Progress Notes (Signed)
Pre-visit discussion using our clinic review tool. No additional management support is needed unless otherwise documented below in the visit note.  

## 2013-02-12 NOTE — Patient Instructions (Signed)
Limit your sodium (Salt) intake  And elevate your legs as much as possible  Call if any significant weight gain shortness of breath or worsening lower extremity swelling  Hemodialysis as scheduled

## 2013-02-12 NOTE — Progress Notes (Signed)
Subjective:    Patient ID: Ryan Arellano, male    DOB: 1922-01-11, 79 y.o.   MRN: [id number]  HPI Pre-visit discussion using our clinic review tool. No additional management support is needed unless otherwise documented below in the visit note.  79 year old patient who has chronic kidney disease on Tuesday Thursday Saturday dialysis regimen. He presents today with a one-week history of increasing lower extremity edema distal to the knees. Denies any shortness of breath PND or orthopnea. He is accompanied by his daughter. He has a history of hypertension and atrial flutter ablation.  Past Medical History  Diagnosis Date  . Pulmonary hypertension   . Atrial fibrillation   . Thrombocytopenia   . Anemia   . Arthritis   . Diverticulitis   . Hypertension   . Renal insufficiency   . Skin lesion of back     History   Social History  . Marital Status: Widowed    Spouse Name: N/A    Number of Children: N/A  . Years of Education: N/A   Occupational History  . retired    Social History Main Topics  . Smoking status: Former Smoker -- 0.50 packs/day for 40 years    Types: Cigarettes    Quit date: 04/11/1979  . Smokeless tobacco: Never Used  . Alcohol Use: Yes     Comment: once per week  . Drug Use: No  . Sexual Activity: No   Other Topics Concern  . Not on file   Social History Narrative  . No narrative on file    Past Surgical History  Procedure Laterality Date  . Ruptured colon  1980  . S/p colectomy  1980  . Tonsillectomy    . Av fistula placement  2002    left arm    Family History  Problem Relation Age of Onset  . Heart disease Father   . Hypertension Father   . Heart disease Mother     No Known Allergies  Current Outpatient Prescriptions on File Prior to Visit  Medication Sig Dispense Refill  . anagrelide (AGRYLIN) 0.5 MG capsule Take 2.5 mg by mouth 4 (four) times daily.       Marland Kitchen aspirin 81 MG tablet Take 81 mg by mouth daily.        .  butalbital-acetaminophen-caffeine (FIORICET) 50-325-40 MG per tablet Take 1 tablet by mouth every 6 (six) hours as needed for pain.      . calcium acetate (PHOSLO) 667 MG capsule Take 667 mg by mouth 3 (three) times daily with meals.      . carvedilol (COREG) 25 MG tablet Take 25 mg by mouth 2 (two) times daily with a meal. SUN, MON, WED, FRI      . colchicine 0.6 MG tablet Take 0.6 mg by mouth 2 (two) times daily.      . febuxostat (ULORIC) 40 MG tablet Take 40 mg by mouth daily.       . folic acid (FOLVITE) 1 MG tablet Take 1 mg by mouth 2 (two) times daily.      Marland Kitchen lidocaine (LIDODERM) 5 % Place 1 patch onto the skin daily. Remove & Discard patch within 12 hours or as directed by MD      . Linaclotide (LINZESS) 290 MCG CAPS capsule Take 1 capsule (290 mcg total) by mouth daily.  30 capsule    . omeprazole (PRILOSEC) 40 MG capsule Take 40 mg by mouth daily.      Marland Kitchen oxyCODONE-acetaminophen (PERCOCET/ROXICET) 5-325 MG  per tablet Take 0.5 tablets by mouth as needed for pain.      . polyethylene glycol powder (GLYCOLAX/MIRALAX) powder Take 17 g by mouth 2 (two) times daily as needed.  3350 g  1  . pregabalin (LYRICA) 50 MG capsule Take 50 mg by mouth 2 (two) times daily.       Marland Kitchen UNABLE TO FIND Pt unsure of name, it is a topical cream that pt puts on foot for arthritis      . vitamin C (ASCORBIC ACID) 250 MG tablet Take 250 mg by mouth daily.       No current facility-administered medications on file prior to visit.    BP 120/60  Pulse 94  Resp 18  Wt 116 lb (52.617 kg)  SpO2 95%     Review of Systems  Constitutional: Positive for fatigue. Negative for fever, chills and appetite change.  HENT: Negative for congestion, dental problem, ear pain, hearing loss, sore throat, tinnitus, trouble swallowing and voice change.   Eyes: Negative for pain, discharge and visual disturbance.  Respiratory: Negative for cough, chest tightness, wheezing and stridor.   Cardiovascular: Positive for leg  swelling. Negative for chest pain and palpitations.  Gastrointestinal: Negative for nausea, vomiting, abdominal pain, diarrhea, constipation, blood in stool and abdominal distention.  Genitourinary: Negative for urgency, hematuria, flank pain, discharge, difficulty urinating and genital sores.  Musculoskeletal: Positive for gait problem. Negative for arthralgias, back pain, joint swelling, myalgias and neck stiffness.  Skin: Negative for rash.  Neurological: Negative for dizziness, syncope, speech difficulty, weakness, numbness and headaches.  Hematological: Negative for adenopathy. Does not bruise/bleed easily.  Psychiatric/Behavioral: Negative for behavioral problems and dysphoric mood. The patient is not nervous/anxious.        Objective:   Physical Exam  Constitutional: He is oriented to person, place, and time. He appears well-developed. No distress.  HENT:  Head: Normocephalic.  Right Ear: External ear normal.  Left Ear: External ear normal.  Eyes: Conjunctivae and EOM are normal.  Neck: Normal range of motion.  Cardiovascular: Normal rate and normal heart sounds.   Irregular rhythm with controlled ventricular response O2 saturation 95  Pulmonary/Chest: Effort normal and breath sounds normal. No respiratory distress. He has no wheezes.  Abdominal: Bowel sounds are normal.  Musculoskeletal: Normal range of motion. He exhibits edema. He exhibits no tenderness.  +2 edema distal to the knees  Pedal pulses surprisingly palpable in spite of edema  Feet are slightly cool with some mild cyanosis of the toes. Cyanosis blanches No skin breakdown  Neurological: He is alert and oriented to person, place, and time.  Psychiatric: He has a normal mood and affect. His behavior is normal.          Assessment & Plan:   LE Edema ESRD HTN AF  Will attempt to further minimiZe salt intake and  Elevate legs HD as planned  Call if sob or any worsening

## 2013-02-14 ENCOUNTER — Telehealth: Payer: Self-pay | Admitting: Internal Medicine

## 2013-02-14 NOTE — Telephone Encounter (Signed)
pt called to r/s appt...done °

## 2013-02-17 ENCOUNTER — Ambulatory Visit: Payer: Medicare Other | Admitting: Internal Medicine

## 2013-02-28 ENCOUNTER — Telehealth: Payer: Self-pay | Admitting: Internal Medicine

## 2013-02-28 ENCOUNTER — Other Ambulatory Visit: Payer: Self-pay | Admitting: *Deleted

## 2013-02-28 NOTE — Telephone Encounter (Signed)
Ryan Arellano needs medication clarification on pt's FL2 form and also needs hard scripts for certain medications that the patient is taking. Please call her on her personal cell [phone]

## 2013-02-28 NOTE — Telephone Encounter (Signed)
Left message on machine For Ryan Arellano that I called her

## 2013-02-28 NOTE — Telephone Encounter (Signed)
Talked with nurse- she will fax over changed and I will have dr Lovell Sheehan sign and fax back

## 2013-03-03 ENCOUNTER — Encounter: Payer: Self-pay | Admitting: Internal Medicine

## 2013-03-03 ENCOUNTER — Ambulatory Visit (HOSPITAL_BASED_OUTPATIENT_CLINIC_OR_DEPARTMENT_OTHER): Payer: Medicare Other | Admitting: Internal Medicine

## 2013-03-03 ENCOUNTER — Encounter (INDEPENDENT_AMBULATORY_CARE_PROVIDER_SITE_OTHER): Payer: Self-pay

## 2013-03-03 VITALS — BP 110/60 | HR 87 | Temp 98.0°F | Resp 20 | Ht 65.0 in | Wt 114.9 lb

## 2013-03-03 DIAGNOSIS — D473 Essential (hemorrhagic) thrombocythemia: Secondary | ICD-10-CM

## 2013-03-03 NOTE — Progress Notes (Signed)
[facility] Telephone:([phone]) [phone]   Fax:([phone]) [phone]  OFFICE PROGRESS NOTE  [name], MD [address] [place] [address]  DIAGNOSIS: Essential thrombocythemia   CURRENT THERAPY: Anagrelide 2.0 mg by mouth daily.  INTERVAL HISTORY: Ryan Arellano 79 y.o. male returns to the clinic today for followup visit accompanied his daughter. The patient is feeling fine today with no specific complaints except for mild fatigue. He started hemodialysis recently under the care of Dr. Eliott Nine. He denied having any significant bleeding issues. He fell recently at the dialysis center and had compression fracture of the anterior superior end plate of L1. He was seen by the preferred pain management and the spine care Center and expected to have a vertebroplasty performed at and the next few weeks. He is currently on anagrelide 2.0 mg by mouth daily and tolerating it fairly well. His recent CBC from the dialysis Center showed white blood count 11.94, hemoglobin 10.5, hematocrit 33.5% with platelets count of 338,000. The patient is here today for evaluation and discussion of his lab results and recommendation regarding treatment of the essential thrombocythemia.  MEDICAL HISTORY: Past Medical History  Diagnosis Date  . Pulmonary hypertension   . Atrial fibrillation   . Thrombocytopenia   . Anemia   . Arthritis   . Diverticulitis   . Hypertension   . Renal insufficiency   . Skin lesion of back     ALLERGIES:  has No Known Allergies.  MEDICATIONS:  Current Outpatient Prescriptions  Medication Sig Dispense Refill  . acetaminophen (TYLENOL) 500 MG tablet Take 500 mg by mouth every 6 (six) hours as needed.      Marland Kitchen anagrelide (AGRYLIN) 0.5 MG capsule Take 2.5 mg by mouth 4 (four) times daily.       Marland Kitchen aspirin 81 MG tablet Take 81 mg by mouth daily.        . butalbital-acetaminophen-caffeine (FIORICET) 50-325-40 MG per tablet Take 1 tablet by mouth every 6  (six) hours as needed for pain.      . calcium acetate (PHOSLO) 667 MG capsule Take 667 mg by mouth 3 (three) times daily with meals.      . carvedilol (COREG) 25 MG tablet Take 25 mg by mouth 2 (two) times daily with a meal. SUN, MON, WED, FRI      . colchicine 0.6 MG tablet Take 0.6 mg by mouth 2 (two) times daily.      . febuxostat (ULORIC) 40 MG tablet Take 40 mg by mouth daily.       . folic acid (FOLVITE) 1 MG tablet Take 1 mg by mouth 2 (two) times daily.      Marland Kitchen lidocaine (LIDODERM) 5 % Place 1 patch onto the skin daily. Remove & Discard patch within 12 hours or as directed by MD      . Linaclotide (LINZESS) 290 MCG CAPS capsule Take 1 capsule (290 mcg total) by mouth daily.  30 capsule    . omeprazole (PRILOSEC) 40 MG capsule Take 40 mg by mouth daily.      Marland Kitchen oxyCODONE-acetaminophen (PERCOCET/ROXICET) 5-325 MG per tablet Take 0.5 tablets by mouth as needed for pain.      . polyethylene glycol powder (GLYCOLAX/MIRALAX) powder Take 17 g by mouth 2 (two) times daily as needed.  3350 g  1  . pregabalin (LYRICA) 50 MG capsule Take 50 mg by mouth 2 (two) times daily.       Marland Kitchen UNABLE TO  FIND Pt unsure of name, it is a topical cream that pt puts on foot for arthritis      . vitamin C (ASCORBIC ACID) 250 MG tablet Take 250 mg by mouth daily.       No current facility-administered medications for this visit.    SURGICAL HISTORY:  Past Surgical History  Procedure Laterality Date  . Ruptured colon  1980  . S/p colectomy  1980  . Tonsillectomy    . Av fistula placement  2002    left arm    REVIEW OF SYSTEMS:  Constitutional: positive for fatigue Musculoskeletal:positive for back pain   PHYSICAL EXAMINATION: General appearance: alert, cooperative, fatigued and no distress Head: Normocephalic, without obvious abnormality, atraumatic Neck: no adenopathy Lymph nodes: Cervical, supraclavicular, and axillary nodes normal. Resp: clear to auscultation bilaterally Cardio: regular rate and  rhythm, S1, S2 normal, no murmur, click, rub or gallop GI: soft, non-tender; bowel sounds normal; no masses,  no organomegaly Extremities: extremities normal, atraumatic, no cyanosis or edema  ECOG PERFORMANCE STATUS: 2 - Symptomatic, <50% confined to bed  Blood pressure 110/60, pulse 87, temperature 98 F (36.7 C), temperature source Oral, resp. rate 20, height 5\' 5"  (1.651 m), weight 114 lb 14.4 oz (52.118 kg).  LABORATORY DATA: CBC on 01/30/2013 showed white blood count 11.94, hemoglobin 10.5 hematocrit 31.5 and platelets count 338,000.   RADIOGRAPHIC STUDIES: No results found.  ASSESSMENT AND PLAN: This is a very pleasant 79 years old white male with history of essential thrombocythemia currently on anagrelide 2.0 mg by mouth daily and the last CBC performed at at the dialysis center showed platelets count of 338,000.   I recommended for the patient to continue anagrelide at a dose to 2.0 mg by mouth daily. He'll continue to have repeat CBC as scheduled at the dialysis center. I will see the patient back for followup visit in 3 months for reevaluation with repeat CBC. He was advised to call immediately if he has any concerning symptoms or significant abnormality in his platelet count.  The patient voices understanding of current disease status and treatment options and is in agreement with the current care plan.  All questions were answered. The patient knows to call the clinic with any problems, questions or concerns. We can certainly see the patient much sooner if necessary.

## 2013-03-04 ENCOUNTER — Telehealth: Payer: Self-pay | Admitting: Internal Medicine

## 2013-03-04 NOTE — Telephone Encounter (Signed)
s.w. pt daughter and advised on Feb 2015 appt...ok and aware

## 2013-03-12 ENCOUNTER — Other Ambulatory Visit: Payer: Self-pay | Admitting: *Deleted

## 2013-03-14 ENCOUNTER — Telehealth: Payer: Self-pay | Admitting: Internal Medicine

## 2013-03-14 ENCOUNTER — Other Ambulatory Visit: Payer: Self-pay | Admitting: *Deleted

## 2013-03-14 NOTE — Telephone Encounter (Signed)
Pt has a pain patch, but family and pt requesting another patch to be put on. pls call

## 2013-03-14 NOTE — Telephone Encounter (Signed)
Done

## 2013-03-14 NOTE — Telephone Encounter (Signed)
Script for 2 patches every 12 hours sent to [facility] fax [phone]

## 2013-03-14 NOTE — Telephone Encounter (Signed)
pls add PRN to the order faxed to [facility]. Fax: [phone]

## 2013-03-19 ENCOUNTER — Telehealth: Payer: Self-pay | Admitting: Internal Medicine

## 2013-03-19 NOTE — Telephone Encounter (Signed)
Pt complaining of constipation,. would like order for laxitive Fax: [phone]

## 2013-03-19 NOTE — Telephone Encounter (Signed)
Pt has amitiza ordered -they didn't see

## 2013-03-24 ENCOUNTER — Telehealth: Payer: Self-pay | Admitting: Internal Medicine

## 2013-03-24 NOTE — Telephone Encounter (Signed)
Talked with daughter and she wanted to know why another hip xray was ordered. Per hip xray from last week, the radiologist requested it.daughter informed

## 2013-03-24 NOTE — Telephone Encounter (Signed)
Pt's daughter calling to report pt is at [facility] and was told an x-ray(not sure what type of x-ray) was ordered for him by Dr. Lovell Sheehan.  Pt had an x-ray ordered and performed last week by the pain management physician and wants to know why Dr. Lovell Sheehan ordered another one for today.  Please return call to daughter.

## 2013-04-15 ENCOUNTER — Telehealth: Payer: Self-pay | Admitting: Internal Medicine

## 2013-04-15 NOTE — Telephone Encounter (Signed)
Pt is sch with padonda tomorrow

## 2013-04-15 NOTE — Telephone Encounter (Signed)
Pt is having blood discharge from his penis. Pt is going to dialyses today. laureen from [facility] will call patient daughter and see if she could bring her dad to an appt tomorrow. [facility] only provide transportation on tues and thurs

## 2013-04-16 ENCOUNTER — Encounter: Payer: Self-pay | Admitting: Family

## 2013-04-16 ENCOUNTER — Ambulatory Visit: Payer: Self-pay | Admitting: Podiatrist

## 2013-04-16 ENCOUNTER — Ambulatory Visit (INDEPENDENT_AMBULATORY_CARE_PROVIDER_SITE_OTHER): Payer: Medicare Other | Admitting: Family

## 2013-04-16 VITALS — BP 134/60 | HR 90 | Temp 97.7°F | Wt 123.0 lb

## 2013-04-16 DIAGNOSIS — B356 Tinea cruris: Secondary | ICD-10-CM

## 2013-04-16 DIAGNOSIS — N4889 Other specified disorders of penis: Secondary | ICD-10-CM

## 2013-04-16 MED ORDER — CLOTRIMAZOLE-BETAMETHASONE 1-0.05 % EX CREA: 1.0000 "application " | TOPICAL_CREAM | Freq: Two times a day (BID) | CUTANEOUS | Status: DC

## 2013-04-16 MED ORDER — CEPHALEXIN 500 MG PO CAPS: 500.0000 mg | ORAL_CAPSULE | Freq: Four times a day (QID) | ORAL | Status: DC

## 2013-04-16 NOTE — Progress Notes (Signed)
Subjective:    Patient ID: Ryan Arellano, male    DOB: [birth year], 80 y.o.   MRN: [id number]  HPI   80 year old white male, nonsmoker presenting today with blood coming from his penis.  Symptoms present x 1 week.  His groin is itchy and he is constantly scratching. Has not tried any therapy. He has a history of ESRD and is anuric.     Review of Systems  Constitutional: Negative.   HENT: Negative.   Eyes: Negative.   Respiratory: Negative.   Cardiovascular: Negative.   Gastrointestinal: Negative.   Endocrine: Negative.   Genitourinary: Negative.        Blood from penis  Musculoskeletal: Negative.   Skin: Negative.   Allergic/Immunologic: Negative.   Neurological: Negative.   Hematological: Negative.   Psychiatric/Behavioral: Negative.    Past Medical History  Diagnosis Date  . Pulmonary hypertension   . Atrial fibrillation   . Thrombocytopenia   . Anemia   . Arthritis   . Diverticulitis   . Hypertension   . Renal insufficiency   . Skin lesion of back     History   Social History  . Marital Status: Widowed    Spouse Name: N/A    Number of Children: N/A  . Years of Education: N/A   Occupational History  . retired    Social History Main Topics  . Smoking status: Former Smoker -- 0.50 packs/day for 40 years    Types: Cigarettes    Quit date: 04/11/1979  . Smokeless tobacco: Never Used  . Alcohol Use: Yes     Comment: once per week  . Drug Use: No  . Sexual Activity: No   Other Topics Concern  . Not on file   Social History Narrative  . No narrative on file    Past Surgical History  Procedure Laterality Date  . Ruptured colon  1980  . S/p colectomy  1980  . Tonsillectomy    . Av fistula placement  2002    left arm    Family History  Problem Relation Age of Onset  . Heart disease Father   . Hypertension Father   . Heart disease Mother     No Known Allergies  Current Outpatient Prescriptions on File Prior to Visit  Medication Sig  Dispense Refill  . acetaminophen (TYLENOL) 500 MG tablet Take 500 mg by mouth every 6 (six) hours as needed.      Marland Kitchen anagrelide (AGRYLIN) 0.5 MG capsule Take 2.5 mg by mouth 4 (four) times daily.       Marland Kitchen aspirin 81 MG tablet Take 81 mg by mouth daily.        . colchicine 0.6 MG tablet Take 0.6 mg by mouth 2 (two) times daily.      . folic acid (FOLVITE) 1 MG tablet Take 1 mg by mouth 2 (two) times daily.      Marland Kitchen lidocaine (LIDODERM) 5 % Place 1 patch onto the skin daily. Remove & Discard patch within 12 hours or as directed by MD      . omeprazole (PRILOSEC) 40 MG capsule Take 40 mg by mouth daily.      Marland Kitchen oxycodone (OXY-IR) 5 MG capsule Take 2.5 mg by mouth 3 (three) times daily as needed.      . pregabalin (LYRICA) 50 MG capsule Take 100 mg by mouth at bedtime.       . vitamin C (ASCORBIC ACID) 250 MG tablet Take 250 mg by  mouth daily.      . calcium acetate (PHOSLO) 667 MG capsule Take 667 mg by mouth daily.       . carvedilol (COREG) 25 MG tablet Take 12.5 mg by mouth at bedtime. 1/2 tab at bedtime      . febuxostat (ULORIC) 40 MG tablet Take 40 mg by mouth daily.       . polyethylene glycol powder (GLYCOLAX/MIRALAX) powder Take 17 g by mouth 2 (two) times daily as needed.  3350 g  1  . UNABLE TO FIND Pt unsure of name, it is a topical cream that pt puts on foot for arthritis       No current facility-administered medications on file prior to visit.    BP 134/60  Pulse 90  Temp(Src) 97.7 F (36.5 C) (Oral)  Wt 123 lb (55.792 kg)  SpO2 90%chart    Objective:   Physical Exam  Constitutional: He is oriented to person, place, and time. He appears well-developed and well-nourished.  Cardiovascular: Normal rate, regular rhythm and normal heart sounds.   Pulmonary/Chest: Effort normal and breath sounds normal.  Genitourinary:  Penis red and swollen  Neurological: He is alert and oriented to person, place, and time.  Skin: Skin is warm and dry.  Psychiatric: He has a normal mood and  affect.          Assessment & Plan:  Assessment 1. Tinea Cruris 2. Possible UTI 3. Penile swelling  Plan 1. Nystatin cream BID.  2. Keep area clean and  Dry. 3. Contact office for questions or concerns. Cephalexin 500mg  4 times a day x 7 days.

## 2013-04-16 NOTE — Patient Instructions (Addendum)
1. Apply Lotrisone cream twice a day x 5 days 2. Cephalexin 500mg  4 times a day x 1 week.  Yeast Infection of the Skin Some yeast on the skin is normal, but sometimes it causes an infection. If you have a yeast infection, it shows up as white or light brown patches on brown skin. You can see it better in the summer on tan skin. It causes light-colored holes in your suntan. It can happen on any area of the body. This cannot be passed from person to person. HOME CARE  Scrub your skin daily with a dandruff shampoo. Your rash may take a couple weeks to get well.  Do not scratch or itch the rash. GET HELP RIGHT AWAY IF:   You get another infection from scratching. The skin may get warm, red, and may ooze fluid.  The infection does not seem to be getting better. MAKE SURE YOU:  Understand these instructions.  Will watch your condition.  Will get help right away if you are not doing well or get worse. Document Released: 03/09/2008 Document Revised: 06/19/2011 Document Reviewed: 03/09/2008 [facility] Patient Information 2014 [place].

## 2013-04-21 ENCOUNTER — Ambulatory Visit (INDEPENDENT_AMBULATORY_CARE_PROVIDER_SITE_OTHER): Payer: Medicare Other | Admitting: Internal Medicine

## 2013-04-21 ENCOUNTER — Encounter: Payer: Self-pay | Admitting: Internal Medicine

## 2013-04-21 VITALS — BP 100/60 | HR 76 | Temp 98.0°F | Resp 18 | Ht 65.0 in | Wt 121.0 lb

## 2013-04-21 DIAGNOSIS — N186 End stage renal disease: Secondary | ICD-10-CM

## 2013-04-21 DIAGNOSIS — B0229 Other postherpetic nervous system involvement: Secondary | ICD-10-CM

## 2013-04-21 DIAGNOSIS — I10 Essential (primary) hypertension: Secondary | ICD-10-CM

## 2013-04-21 DIAGNOSIS — I4891 Unspecified atrial fibrillation: Secondary | ICD-10-CM

## 2013-04-21 MED ORDER — LIDOCAINE 5 % EX PTCH: 3.0000 | MEDICATED_PATCH | CUTANEOUS | Status: DC

## 2013-04-21 MED ORDER — DICLOFENAC SODIUM 1 % TD GEL: 2.0000 g | Freq: Four times a day (QID) | TRANSDERMAL | Status: DC

## 2013-04-21 NOTE — Progress Notes (Signed)
Subjective:    Patient ID: Ryan Arellano, male    DOB: 1921-11-03, 79 y.o.   MRN: [id number]  HPI Has been to pain management for chronic pain for both the fracture and for the ppost herpetic neuralgia Lumbar collapsed disc The Pain clinic did not follow up  MRI from [facility] cannot see but reported to be a 50% compression fracture Pain better with current management   Review of Systems  Respiratory: Positive for shortness of breath.   Cardiovascular: Positive for leg swelling.  Musculoskeletal: Positive for arthralgias, back pain and myalgias.   Past Medical History  Diagnosis Date  . Pulmonary hypertension   . Atrial fibrillation   . Thrombocytopenia   . Anemia   . Arthritis   . Diverticulitis   . Hypertension   . Renal insufficiency   . Skin lesion of back     History   Social History  . Marital Status: Widowed    Spouse Name: N/A    Number of Children: N/A  . Years of Education: N/A   Occupational History  . retired    Social History Main Topics  . Smoking status: Former Smoker -- 0.50 packs/day for 40 years    Types: Cigarettes    Quit date: 04/11/1979  . Smokeless tobacco: Never Used  . Alcohol Use: Yes     Comment: once per week  . Drug Use: No  . Sexual Activity: No   Other Topics Concern  . Not on file   Social History Narrative  . No narrative on file    Past Surgical History  Procedure Laterality Date  . Ruptured colon  1980  . S/p colectomy  1980  . Tonsillectomy    . Av fistula placement  2002    left arm    Family History  Problem Relation Age of Onset  . Heart disease Father   . Hypertension Father   . Heart disease Mother     No Known Allergies  Current Outpatient Prescriptions on File Prior to Visit  Medication Sig Dispense Refill  . acetaminophen (TYLENOL) 500 MG tablet Take 500 mg by mouth every 6 (six) hours as needed.      Marland Kitchen anagrelide (AGRYLIN) 0.5 MG capsule Take 2.5 mg by mouth 4 (four) times daily.       Marland Kitchen  aspirin 81 MG tablet Take 81 mg by mouth daily.        . calcium acetate (PHOSLO) 667 MG capsule Take 667 mg by mouth 3 (three) times daily with meals.       . carvedilol (COREG) 25 MG tablet Take 12.5 mg by mouth at bedtime. 1/2 tab at bedtime      . cephALEXin (KEFLEX) 500 MG capsule Take 1 capsule (500 mg total) by mouth 4 (four) times daily.  28 capsule  0  . clotrimazole-betamethasone (LOTRISONE) cream Apply 1 application topically 2 (two) times daily.  30 g  0  . colchicine 0.6 MG tablet Take 0.6 mg by mouth 2 (two) times daily as needed.       . febuxostat (ULORIC) 40 MG tablet Take 40 mg by mouth daily.       . folic acid (FOLVITE) 1 MG tablet Take 1 mg by mouth 2 (two) times daily.      Marland Kitchen lidocaine (LIDODERM) 5 % Place 1 patch onto the skin daily. Remove & Discard patch within 12 hours or as directed by MD      . midodrine (PROAMATINE) 10  MG tablet Take 10 mg by mouth 3 (three) times daily.      Marland Kitchen omeprazole (PRILOSEC) 40 MG capsule Take 40 mg by mouth daily.      Marland Kitchen oxycodone (OXY-IR) 5 MG capsule Take 2.5 mg by mouth 3 (three) times daily as needed.      . polyethylene glycol powder (GLYCOLAX/MIRALAX) powder Take 17 g by mouth 2 (two) times daily as needed.  3350 g  1  . pregabalin (LYRICA) 50 MG capsule Take 100 mg by mouth at bedtime.       . senna (SENOKOT) 8.6 MG tablet Take 1 tablet by mouth daily.      Marland Kitchen UNABLE TO FIND Pt unsure of name, it is a topical cream that pt puts on foot for arthritis      . vitamin C (ASCORBIC ACID) 250 MG tablet Take 250 mg by mouth daily.       No current facility-administered medications on file prior to visit.    BP 100/60  Pulse 76  Temp(Src) 98 F (36.7 C)  Resp 18  Ht 5\' 5"  (1.651 m)  Wt 121 lb (54.885 kg)  BMI 20.14 kg/m2       Objective:   Physical Exam  Constitutional: He is oriented to person, place, and time. He appears well-developed and well-nourished.  HENT:  Head: Normocephalic and atraumatic.  Eyes: Conjunctivae are  normal. Pupils are equal, round, and reactive to light.  Neck: Normal range of motion. Neck supple.  Cardiovascular: Normal rate and regular rhythm.   Pulmonary/Chest: Effort normal and breath sounds normal.  Abdominal: Soft. Bowel sounds are normal.  Neurological: He is alert and oriented to person, place, and time.  Skin: Skin is warm and dry.  Psychiatric: He has a normal mood and affect. His behavior is normal.          Assessment & Plan:

## 2013-04-21 NOTE — Patient Instructions (Signed)
The patient is instructed to continue all medications as prescribed. Schedule followup with check out clerk upon leaving the clinic  

## 2013-04-21 NOTE — Progress Notes (Signed)
CINA not available today  

## 2013-04-30 ENCOUNTER — Encounter: Payer: Self-pay | Admitting: Podiatrist

## 2013-04-30 ENCOUNTER — Ambulatory Visit (INDEPENDENT_AMBULATORY_CARE_PROVIDER_SITE_OTHER): Payer: Medicare Other | Admitting: Podiatrist

## 2013-04-30 ENCOUNTER — Other Ambulatory Visit (HOSPITAL_COMMUNITY): Payer: Medicare Other

## 2013-04-30 VITALS — BP 132/72 | HR 84 | Resp 18

## 2013-04-30 DIAGNOSIS — M79609 Pain in unspecified limb: Secondary | ICD-10-CM

## 2013-04-30 DIAGNOSIS — B351 Tinea unguium: Secondary | ICD-10-CM

## 2013-04-30 NOTE — Progress Notes (Signed)
My toenails are thick and need to be cut  HPI:  Patient presents today for follow up of foot and nail care. Denies any new complaints today.  Objective:  Patients chart is reviewed.  Neurovascular status unchanged.  Patients nails are thickened, discolored, distrophic, friable and brittle with yellow-brown discoloration. Patient subjectively relates they are painful with shoes and with ambulation of bilateral feet. No debridement carried out on the 2nd toe right today as it tends to bleed easily.  All other nails are debrided  Assessment:  Symptomatic onychomycosis x 9  Plan:  Discussed treatment options and alternatives.  The symptomatic toenails were debrided through manual an mechanical means without complication.  Return appointment recommended at routine intervals of 3 months    Trudie Buckler, DPM

## 2013-05-13 ENCOUNTER — Telehealth: Payer: Self-pay | Admitting: Internal Medicine

## 2013-05-13 NOTE — Telephone Encounter (Signed)
Relevant patient education mailed to patient.  

## 2013-05-21 ENCOUNTER — Other Ambulatory Visit (HOSPITAL_COMMUNITY): Payer: Self-pay | Admitting: Radiology

## 2013-05-21 ENCOUNTER — Ambulatory Visit (HOSPITAL_COMMUNITY): Payer: Medicare Other | Attending: Cardiology | Admitting: Radiology

## 2013-05-21 ENCOUNTER — Other Ambulatory Visit: Payer: Self-pay | Admitting: Internal Medicine

## 2013-05-21 DIAGNOSIS — I129 Hypertensive chronic kidney disease with stage 1 through stage 4 chronic kidney disease, or unspecified chronic kidney disease: Secondary | ICD-10-CM | POA: Insufficient documentation

## 2013-05-21 DIAGNOSIS — I319 Disease of pericardium, unspecified: Secondary | ICD-10-CM

## 2013-05-21 DIAGNOSIS — I313 Pericardial effusion (noninflammatory): Secondary | ICD-10-CM

## 2013-05-21 DIAGNOSIS — I4891 Unspecified atrial fibrillation: Secondary | ICD-10-CM | POA: Insufficient documentation

## 2013-05-21 DIAGNOSIS — I3139 Other pericardial effusion (noninflammatory): Secondary | ICD-10-CM

## 2013-05-21 DIAGNOSIS — Z87891 Personal history of nicotine dependence: Secondary | ICD-10-CM | POA: Insufficient documentation

## 2013-05-21 DIAGNOSIS — I059 Rheumatic mitral valve disease, unspecified: Secondary | ICD-10-CM | POA: Insufficient documentation

## 2013-05-21 DIAGNOSIS — I079 Rheumatic tricuspid valve disease, unspecified: Secondary | ICD-10-CM | POA: Insufficient documentation

## 2013-05-21 DIAGNOSIS — I379 Nonrheumatic pulmonary valve disorder, unspecified: Secondary | ICD-10-CM | POA: Insufficient documentation

## 2013-05-21 DIAGNOSIS — I959 Hypotension, unspecified: Secondary | ICD-10-CM | POA: Insufficient documentation

## 2013-05-21 DIAGNOSIS — N189 Chronic kidney disease, unspecified: Secondary | ICD-10-CM | POA: Insufficient documentation

## 2013-05-21 NOTE — Progress Notes (Signed)
Echocardiogram performed.  

## 2013-05-22 ENCOUNTER — Encounter: Payer: Self-pay | Admitting: Internal Medicine

## 2013-05-30 ENCOUNTER — Telehealth: Payer: Self-pay | Admitting: *Deleted

## 2013-05-30 NOTE — Telephone Encounter (Signed)
Received lab work from [facility].  These labs were drawn 05/08/13.  They show K of 6.5.  Pt received dialysis 3 days weekly.  Spoke to Yaak, will recheck lab work on 2/23 when he comes in for f/u appt with [facility].  Spoke with pt's daughter Butch Penny, she is aware of lab appt on 2/23.  SLJ

## 2013-06-02 ENCOUNTER — Other Ambulatory Visit: Payer: Medicare Other | Admitting: Lab

## 2013-06-02 ENCOUNTER — Ambulatory Visit (HOSPITAL_BASED_OUTPATIENT_CLINIC_OR_DEPARTMENT_OTHER): Payer: Medicare Other | Admitting: Internal Medicine

## 2013-06-02 ENCOUNTER — Telehealth: Payer: Self-pay | Admitting: Internal Medicine

## 2013-06-02 ENCOUNTER — Other Ambulatory Visit (HOSPITAL_BASED_OUTPATIENT_CLINIC_OR_DEPARTMENT_OTHER): Payer: Medicare Other

## 2013-06-02 ENCOUNTER — Encounter: Payer: Self-pay | Admitting: Internal Medicine

## 2013-06-02 ENCOUNTER — Ambulatory Visit: Payer: Medicare Other

## 2013-06-02 VITALS — BP 148/97 | HR 81 | Temp 97.5°F | Resp 18 | Ht 65.0 in | Wt 127.7 lb

## 2013-06-02 DIAGNOSIS — R5383 Other fatigue: Secondary | ICD-10-CM

## 2013-06-02 DIAGNOSIS — R5381 Other malaise: Secondary | ICD-10-CM

## 2013-06-02 DIAGNOSIS — D473 Essential (hemorrhagic) thrombocythemia: Secondary | ICD-10-CM

## 2013-06-02 DIAGNOSIS — Z992 Dependence on renal dialysis: Secondary | ICD-10-CM

## 2013-06-02 LAB — COMPREHENSIVE METABOLIC PANEL (CC13)
ALT: 17 U/L (ref 0–55)
AST: 18 U/L (ref 5–34)
Albumin: 3.9 g/dL (ref 3.5–5.0)
Alkaline Phosphatase: 117 U/L (ref 40–150)
BUN: 93.6 mg/dL — ABNORMAL HIGH (ref 7.0–26.0)
CHLORIDE: 98 meq/L (ref 98–109)
CO2: 21 meq/L — AB (ref 22–29)
Calcium: 9.9 mg/dL (ref 8.4–10.4)
Creatinine: 6.5 mg/dL (ref 0.7–1.3)
Glucose: 89 mg/dl (ref 70–140)
POTASSIUM: 4.5 meq/L (ref 3.5–5.1)
Sodium: 141 mEq/L (ref 136–145)
Total Bilirubin: 0.48 mg/dL (ref 0.20–1.20)
Total Protein: 7.9 g/dL (ref 6.4–8.3)

## 2013-06-02 LAB — CBC WITH DIFFERENTIAL/PLATELET
BASO%: 1.1 % (ref 0.0–2.0)
Basophils Absolute: 0.2 10*3/uL — ABNORMAL HIGH (ref 0.0–0.1)
EOS%: 0.9 % (ref 0.0–7.0)
Eosinophils Absolute: 0.1 10*3/uL (ref 0.0–0.5)
HCT: 37.1 % — ABNORMAL LOW (ref 38.4–49.9)
HGB: 11.7 g/dL — ABNORMAL LOW (ref 13.0–17.1)
LYMPH#: 2.5 10*3/uL (ref 0.9–3.3)
LYMPH%: 16.7 % (ref 14.0–49.0)
MCH: 28.9 pg (ref 27.2–33.4)
MCHC: 31.7 g/dL — AB (ref 32.0–36.0)
MCV: 91.2 fL (ref 79.3–98.0)
MONO#: 1.3 10*3/uL — AB (ref 0.1–0.9)
MONO%: 8.7 % (ref 0.0–14.0)
NEUT#: 11.1 10*3/uL — ABNORMAL HIGH (ref 1.5–6.5)
NEUT%: 72.6 % (ref 39.0–75.0)
Platelets: 281 10*3/uL (ref 140–400)
RBC: 4.06 10*6/uL — AB (ref 4.20–5.82)
RDW: 15.7 % — AB (ref 11.0–14.6)
WBC: 15.2 10*3/uL — ABNORMAL HIGH (ref 4.0–10.3)

## 2013-06-02 LAB — LACTATE DEHYDROGENASE (CC13): LDH: 349 U/L — ABNORMAL HIGH (ref 125–245)

## 2013-06-02 NOTE — Telephone Encounter (Signed)
gv adn printed aptp sched and avs for pt for March and  May.Marland KitchenMarland KitchenMarland KitchenMarland Kitchenpt has labs drawn at dialysis and will have them sent over for MD visit

## 2013-06-02 NOTE — Progress Notes (Signed)
[place] [phone]   [phone]  OFFICE PROGRESS NOTE  [place]er, MD [place] [address]  DIAGNOSIS: Essential thrombocythemia   CURRENT THERAPY: Anagrelide 2.0 mg by mouth daily.  INTERVAL HISTORY: Ryan Arellano 80 y.o. male returns to the clinic today for followup visit accompanied his daughter. The patient is feeling fine today with no specific complaints except for mild fatigue. He started hemodialysis recently under the care of Dr. Lorrene Reid. He denied having any significant bleeding issues. He is currently on anagrelide 2.0 mg by mouth daily and tolerating it fairly well. He has repeat CBC performed earlier today.The patient is here today for evaluation and discussion of his lab results and recommendation regarding treatment of the essential thrombocythemia.  MEDICAL HISTORY: Past Medical History  Diagnosis Date  . Pulmonary hypertension   . Atrial fibrillation   . Thrombocytopenia   . Anemia   . Arthritis   . Diverticulitis   . Hypertension   . Renal insufficiency   . Skin lesion of back     ALLERGIES:  has No Known Allergies.  MEDICATIONS:  Current Outpatient Prescriptions  Medication Sig Dispense Refill  . acetaminophen (TYLENOL) 500 MG tablet Take 500 mg by mouth every 6 (six) hours as needed.      Marland Kitchen anagrelide (AGRYLIN) 0.5 MG capsule Take 2.5 mg by mouth 4 (four) times daily.       Marland Kitchen aspirin 81 MG tablet Take 81 mg by mouth daily.        . calcium acetate (PHOSLO) 667 MG capsule Take 667 mg by mouth 3 (three) times daily with meals.       . carvedilol (COREG) 25 MG tablet Take 12.5 mg by mouth at bedtime. 1/2 tab at bedtime      . cephALEXin (KEFLEX) 500 MG capsule Take 1 capsule (500 mg total) by mouth 4 (four) times daily.  28 capsule  0  . clotrimazole-betamethasone (LOTRISONE) cream Apply 1 application topically 2 (two) times daily.  30 g  0  . colchicine 0.6 MG tablet Take 0.6  mg by mouth 2 (two) times daily as needed.       . diclofenac sodium (VOLTAREN) 1 % GEL Apply 2 g topically 4 (four) times daily.  100 g  11  . febuxostat (ULORIC) 40 MG tablet Take 40 mg by mouth daily.       . folic acid (FOLVITE) 1 MG tablet TAKE 1 TABLET BY MOUTH TWICE DAILY  180 tablet  2  . lidocaine (LIDODERM) 5 % Place 3 patches onto the skin daily. Place in areas of greatest pain  Up to three patches a day  Remove & Discard patches within 12 hours  90 patch  3  . midodrine (PROAMATINE) 10 MG tablet Take 10 mg by mouth 3 (three) times daily.      Marland Kitchen omeprazole (PRILOSEC) 40 MG capsule Take 40 mg by mouth daily.      Marland Kitchen oxycodone (OXY-IR) 5 MG capsule Take 2.5 mg by mouth 3 (three) times daily as needed.      . polyethylene glycol powder (GLYCOLAX/MIRALAX) powder Take 17 g by mouth 2 (two) times daily as needed.  3350 g  1  . pregabalin (LYRICA) 50 MG capsule Take 100 mg by mouth at bedtime.       . senna (SENOKOT) 8.6 MG tablet Take 1 tablet by mouth daily.      . vitamin C (ASCORBIC  ACID) 250 MG tablet Take 250 mg by mouth daily.       No current facility-administered medications for this visit.    SURGICAL HISTORY:  Past Surgical History  Procedure Laterality Date  . Ruptured colon  1980  . S/p colectomy  1980  . Tonsillectomy    . Av fistula placement  2002    left arm    REVIEW OF SYSTEMS:  Constitutional: positive for fatigue Musculoskeletal:positive for back pain   PHYSICAL EXAMINATION: General appearance: alert, cooperative, fatigued and no distress Head: Normocephalic, without obvious abnormality, atraumatic Neck: no adenopathy Lymph nodes: Cervical, supraclavicular, and axillary nodes normal. Resp: clear to auscultation bilaterally Cardio: regular rate and rhythm, S1, S2 normal, no murmur, click, rub or gallop GI: soft, non-tender; bowel sounds normal; no masses,  no organomegaly Extremities: extremities normal, atraumatic, no cyanosis or edema  ECOG PERFORMANCE  STATUS: 2 - Symptomatic, <50% confined to bed  Blood pressure 148/97, pulse 81, temperature 97.5 F (36.4 C), temperature source Oral, resp. rate 18, height 5\' 5"  (1.651 m), weight 127 lb 11.2 oz (57.924 kg).  LABORATORY DATA: CBC on 06/02/2013 showed white blood count 15.2, hemoglobin 11.7 hematocrit 37.1 and platelets count 281,000.   RADIOGRAPHIC STUDIES: No results found.  ASSESSMENT AND PLAN: This is a very pleasant 80 years old white male with history of essential thrombocythemia currently on anagrelide 2.0 mg by mouth daily and the last CBC performed at at the dialysis center showed platelets count of 338,000.   I recommended for the patient to continue anagrelide at a dose to 2.0 mg by mouth daily. He'll continue to have repeat CBC as scheduled at the dialysis center. I will see the patient back for followup visit in 3 months for reevaluation with repeat CBC, comprehensive metabolic panel and LDH. He was advised to call immediately if he has any concerning symptoms or significant abnormality in his platelet count.  The patient voices understanding of current disease status and treatment options and is in agreement with the current care plan.  All questions were answered. The patient knows to call the clinic with any problems, questions or concerns. We can certainly see the patient much sooner if necessary.  Disclaimer: This note was dictated with voice recognition software. Similar sounding words can inadvertently be transcribed and may not be corrected upon review.

## 2013-06-13 ENCOUNTER — Telehealth: Payer: Self-pay | Admitting: Internal Medicine

## 2013-06-13 NOTE — Telephone Encounter (Signed)
error 

## 2013-06-16 ENCOUNTER — Other Ambulatory Visit: Payer: Self-pay | Admitting: Internal Medicine

## 2013-06-16 ENCOUNTER — Ambulatory Visit (INDEPENDENT_AMBULATORY_CARE_PROVIDER_SITE_OTHER): Payer: Medicare Other | Admitting: Family

## 2013-06-16 ENCOUNTER — Encounter: Payer: Self-pay | Admitting: Family

## 2013-06-16 VITALS — BP 126/70 | HR 93 | Wt 130.0 lb

## 2013-06-16 DIAGNOSIS — N186 End stage renal disease: Secondary | ICD-10-CM

## 2013-06-16 DIAGNOSIS — M549 Dorsalgia, unspecified: Secondary | ICD-10-CM

## 2013-06-16 DIAGNOSIS — Z992 Dependence on renal dialysis: Principal | ICD-10-CM

## 2013-06-16 DIAGNOSIS — D473 Essential (hemorrhagic) thrombocythemia: Secondary | ICD-10-CM

## 2013-06-16 DIAGNOSIS — G8929 Other chronic pain: Secondary | ICD-10-CM

## 2013-06-16 DIAGNOSIS — Z79899 Other long term (current) drug therapy: Secondary | ICD-10-CM

## 2013-06-16 MED ORDER — PREGABALIN 50 MG PO CAPS: 100.0000 mg | ORAL_CAPSULE | Freq: Every day | ORAL | Status: DC

## 2013-06-16 MED ORDER — OXYCODONE HCL 5 MG PO CAPS: 2.5000 mg | ORAL_CAPSULE | Freq: Three times a day (TID) | ORAL | Status: DC | PRN

## 2013-06-16 NOTE — Patient Instructions (Signed)
Chronic Pain Chronic pain can be defined as pain that is off and on and lasts for 3 6 months or longer. Many things cause chronic pain, which can make it difficult to make a diagnosis. There are many treatment options available for chronic pain. However, finding a treatment that works well for you may require trying various approaches until the right one is found. Many people benefit from a combination of two or more types of treatment to control their pain. SYMPTOMS  Chronic pain can occur anywhere in the body and can range from mild to very severe. Some types of chronic pain include:  Headache.  Low back pain.  Cancer pain.  Arthritis pain.  Neurogenic pain. This is pain resulting from damage to nerves. People with chronic pain may also have other symptoms such as:  Depression.  Anger.  Insomnia.  Anxiety. DIAGNOSIS  Your health care provider will help diagnose your condition over time. In many cases, the initial focus will be on excluding possible conditions that could be causing the pain. Depending on your symptoms, your health care provider may order tests to diagnose your condition. Some of these tests may include:   Blood tests.   CT scan.   MRI.   X-rays.   Ultrasounds.   Nerve conduction studies.  You may need to see a specialist.  TREATMENT  Finding treatment that works well may take time. You may be referred to a pain specialist. He or she may prescribe medicine or therapies, such as:   Mindful meditation or yoga.  Shots (injections) of numbing or pain-relieving medicines into the spine or area of pain.  Local electrical stimulation.  Acupuncture.   Massage therapy.   Aroma, color, light, or sound therapy.   Biofeedback.   Working with a physical therapist to keep from getting stiff.   Regular, gentle exercise.   Cognitive or behavioral therapy.   Group support.  Sometimes, surgery may be recommended.  HOME CARE INSTRUCTIONS    Take all medicines as directed by your health care provider.   Lessen stress in your life by relaxing and doing things such as listening to calming music.   Exercise or be active as directed by your health care provider.   Eat a healthy diet and include things such as vegetables, fruits, fish, and lean meats in your diet.   Keep all follow-up appointments with your health care provider.   Attend a support group with others suffering from chronic pain. SEEK MEDICAL CARE IF:   Your pain gets worse.   You develop a new pain that was not there before.   You cannot tolerate medicines given to you by your health care provider.   You have new symptoms since your last visit with your health care provider.  SEEK IMMEDIATE MEDICAL CARE IF:   You feel weak.   You have decreased sensation or numbness.   You lose control of bowel or bladder function.   Your pain suddenly gets much worse.   You develop shaking.  You develop chills.  You develop confusion.  You develop chest pain.  You develop shortness of breath.  MAKE SURE YOU:  Understand these instructions.  Will watch your condition.  Will get help right away if you are not doing well or get worse. Document Released: 12/17/2001 Document Revised: 11/27/2012 Document Reviewed: 09/20/2012 [facility] Patient Information 2014 [place]. End-Stage Kidney Disease The kidneys are two organs that lie on either side of the spine between the middle of  the back and the front of the abdomen. The kidneys:   Remove wastes and extra water from the blood.   Produce important hormones. These help keep bones strong, regulate blood pressure, and help create red blood cells.   Balance the fluids and chemicals in the blood and tissues. End-stage kidney disease occurs when the kidneys are so damaged that they cannot do their job. When the kidneys cannot do their job, life-threatening problems occur. The body cannot stay  clean and strong without the help of the kidneys. In end-stage kidney disease, the kidneys cannot get better.You need a new kidney or treatments to do some of the work healthy kidneys do in order to stay alive. CAUSES  End-stage kidney disease usually occurs when a long-lasting (chronic) kidney disease gets worse. It may also occur after the kidneys are suddenly damaged (acute kidney injury).  SYMPTOMS   Swelling (edema) of the legs, ankles, or feet.   Tiredness (lethargy).   Nausea or vomiting.   Confusion.   Problems with urination, such as:   Decreased urine production.   Frequent urination, especially at night.   Frequent accidents in children who are potty trained.   Muscle twitches and cramps.   Persistent itchiness.   Loss of appetite.   Headaches.   Abnormally dark or light skin.   Numbness in the hands or feet.   Easy bruising.   Frequent hiccups.   Menstruation stops. DIAGNOSIS  Your caregiver will measure your blood pressure and take some tests. These may include:   Urine tests.   Blood tests.   Imaging tests, such as:   An ultrasound exam.   Computed tomography (CT).  A kidney biopsy. TREATMENT  There are two treatments for end-stage kidney disease:   A procedure that removes toxic wastes from the body (dialysis).   Receiving a new kidney (kidney transplant). Both of these treatments have serious risks and consequences. Your caregiver will help you determine which treatment is best for you based on your health, age, and other factors. In addition to having dialysis or a kidney transplant, you may need to take medicines to control high blood pressure (hypertension) and cholesterol and to decrease phosphorus levels in your blood.  HOME CARE INSTRUCTIONS  Follow your prescribed diet.   Only take over-the-counter or prescription medicines as directed by your caregiver.   Do not take any new medicines (prescription,  over-the-counter, or nutritional supplements) unless approved by your caregiver. Many medicines can worsen your kidney damage or need to have the dose adjusted.   Keep all follow-up appointments. MAKE SURE YOU:  Understand these instructions.  Will watch your condition.  Will get help right away if you are not doing well or get worse Document Released: 06/17/2003 Document Revised: 03/13/2012 Document Reviewed: 11/24/2011 [facility] Patient Information 2014 [place].

## 2013-06-16 NOTE — Progress Notes (Signed)
Subjective:    Patient ID: Ryan Arellano, male    DOB: 1922-03-04, 80 y.o.   MRN: [id number]  HPI 80 y.o. White male presents today today for a 2 month follow up appointment. Pt chief complaints is "chronic back pain from shingles". Pt was followed by pain clinic until recently and currently needs refills on lyrica and oxycodone. Pt currently on dialysis for ESRD as well. Pt rates current pain as a 7 on a scale of 1-10. The pain is constant, it is a burning pain, it does not radiate, pt states he gets "ok relief" from his oxycodone and lyrica. Pt denies fever, chills, malaise, change in appetite.     Review of Systems  Constitutional: Negative.   HENT: Negative.   Eyes: Negative.   Respiratory: Negative.   Cardiovascular: Negative.   Gastrointestinal: Negative.   Endocrine: Negative.   Genitourinary: Negative.   Musculoskeletal: Positive for back pain and gait problem.       Chronic back pain related to shingles. Ambulates using walker, states he "has fallen a few times" last year.   Skin: Negative.   Allergic/Immunologic: Negative.   Neurological: Negative.   Hematological: Negative.   Psychiatric/Behavioral: Negative.    Past Medical History  Diagnosis Date  . Pulmonary hypertension   . Atrial fibrillation   . Thrombocytopenia   . Anemia   . Arthritis   . Diverticulitis   . Hypertension   . Renal insufficiency   . Skin lesion of back     History   Social History  . Marital Status: Widowed    Spouse Name: N/A    Number of Children: N/A  . Years of Education: N/A   Occupational History  . retired    Social History Main Topics  . Smoking status: Former Smoker -- 0.50 packs/day for 40 years    Types: Cigarettes    Quit date: 04/11/1979  . Smokeless tobacco: Never Used  . Alcohol Use: Yes     Comment: once per week  . Drug Use: No  . Sexual Activity: No   Other Topics Concern  . Not on file   Social History Narrative  . No narrative on file    Past  Surgical History  Procedure Laterality Date  . Ruptured colon  1980  . S/p colectomy  1980  . Tonsillectomy    . Av fistula placement  2002    left arm    Family History  Problem Relation Age of Onset  . Heart disease Father   . Hypertension Father   . Heart disease Mother     No Known Allergies  Current Outpatient Prescriptions on File Prior to Visit  Medication Sig Dispense Refill  . acetaminophen (TYLENOL) 500 MG tablet Take 500 mg by mouth every 6 (six) hours as needed.      Marland Kitchen aspirin 81 MG tablet Take 81 mg by mouth daily.        . calcium acetate (PHOSLO) 667 MG capsule Take 667 mg by mouth 3 (three) times daily with meals.       . colchicine 0.6 MG tablet Take 0.6 mg by mouth 2 (two) times daily as needed.       . diclofenac sodium (VOLTAREN) 1 % GEL Apply 2 g topically 4 (four) times daily.  100 g  11  . febuxostat (ULORIC) 40 MG tablet Take 40 mg by mouth daily.       . folic acid (FOLVITE) 1 MG tablet TAKE 1 TABLET  BY MOUTH TWICE DAILY  180 tablet  2  . lidocaine (LIDODERM) 5 % Place 3 patches onto the skin daily. Place in areas of greatest pain  Up to three patches a day  Remove & Discard patches within 12 hours  90 patch  3  . midodrine (PROAMATINE) 10 MG tablet Take 10 mg by mouth 3 (three) times daily.      Marland Kitchen omeprazole (PRILOSEC) 40 MG capsule Take 40 mg by mouth daily.      . polyethylene glycol powder (GLYCOLAX/MIRALAX) powder Take 17 g by mouth 2 (two) times daily as needed.  3350 g  1  . senna (SENOKOT) 8.6 MG tablet Take 1 tablet by mouth daily.      . vitamin C (ASCORBIC ACID) 250 MG tablet Take 250 mg by mouth daily.       No current facility-administered medications on file prior to visit.    BP 126/70  Pulse 93  Wt 130 lb (58.968 kg)  SpO2 93%chart    Objective:   Physical Exam  Constitutional: He is oriented to person, place, and time. He appears well-developed and well-nourished. He is active.  Cardiovascular: Normal rate, regular rhythm and  normal heart sounds.   Pulmonary/Chest: Effort normal and breath sounds normal.  Abdominal: Soft. Normal appearance and bowel sounds are normal.  Musculoskeletal:  PT uses walker, and has kyphosis.   Neurological: He is alert and oriented to person, place, and time.  Skin: Skin is warm, dry and intact.  Psychiatric: He has a normal mood and affect. His speech is normal and behavior is normal. Judgment and thought content normal. He exhibits abnormal recent memory.          Assessment & Plan:  80 y.o. [place] male presents for a 2 month follow up appointment and chief complaint of "chronic back pain".  - Back pain: Refill Oxycodone 5 mg TID as needed for pain.   - Refill Lyrica 50mg  po BID   - Get a blood drug screen since pt is ESRD and does not produce urine.  - ESRD: Continue with current medication and dialysis.   Education: Chronic pain management.   Follow up 2 months as scheduled with Dr. Arnoldo Morale.

## 2013-06-16 NOTE — Progress Notes (Signed)
Pre visit review using our clinic review tool, if applicable. No additional management support is needed unless otherwise documented below in the visit note. 

## 2013-06-19 LAB — DRUG SCREEN PANEL (SERUM)

## 2013-08-11 ENCOUNTER — Telehealth: Payer: Self-pay | Admitting: Internal Medicine

## 2013-08-11 NOTE — Telephone Encounter (Signed)
Please fax hard script for resident Oxycodone to pharmacy for refill. Pharmacy fax number [phone]

## 2013-08-13 ENCOUNTER — Telehealth: Payer: Self-pay | Admitting: Internal Medicine

## 2013-08-13 MED ORDER — OXYCODONE HCL 5 MG PO CAPS: 2.5000 mg | ORAL_CAPSULE | Freq: Three times a day (TID) | ORAL | Status: DC | PRN

## 2013-08-13 NOTE — Telephone Encounter (Signed)
Norlishia called from [facility] said pt was taking  1/2 pill 3 times daily as needed and now it is a whole pill 3 times daily as needed and would like to know when this change  Phone number ; [phone]

## 2013-08-14 NOTE — Telephone Encounter (Signed)
rx faxed

## 2013-08-15 NOTE — Telephone Encounter (Signed)
rx was faxed 08/14/13

## 2013-08-21 ENCOUNTER — Telehealth: Payer: Self-pay | Admitting: Medical Oncology

## 2013-08-21 NOTE — Telephone Encounter (Signed)
Labs done in dialysis this week . Will they suffice for next week visit?

## 2013-08-25 ENCOUNTER — Telehealth: Payer: Self-pay | Admitting: Internal Medicine

## 2013-08-25 ENCOUNTER — Encounter: Payer: Self-pay | Admitting: Internal Medicine

## 2013-08-25 ENCOUNTER — Ambulatory Visit (INDEPENDENT_AMBULATORY_CARE_PROVIDER_SITE_OTHER): Payer: Medicare Other | Admitting: Internal Medicine

## 2013-08-25 VITALS — BP 120/60 | HR 97 | Temp 97.4°F | Ht 65.0 in | Wt 127.0 lb

## 2013-08-25 DIAGNOSIS — I10 Essential (primary) hypertension: Secondary | ICD-10-CM

## 2013-08-25 DIAGNOSIS — I2789 Other specified pulmonary heart diseases: Secondary | ICD-10-CM

## 2013-08-25 DIAGNOSIS — N19 Unspecified kidney failure: Secondary | ICD-10-CM

## 2013-08-25 DIAGNOSIS — Z23 Encounter for immunization: Secondary | ICD-10-CM

## 2013-08-25 DIAGNOSIS — N186 End stage renal disease: Secondary | ICD-10-CM

## 2013-08-25 DIAGNOSIS — I4891 Unspecified atrial fibrillation: Secondary | ICD-10-CM

## 2013-08-25 MED ORDER — OXYCODONE HCL 5 MG PO CAPS: 2.5000 mg | ORAL_CAPSULE | Freq: Three times a day (TID) | ORAL | Status: DC | PRN

## 2013-08-25 NOTE — Addendum Note (Signed)
Addended by: Julieta Bellini on: 08/25/2013 12:31 PM   Modules accepted: Orders

## 2013-08-25 NOTE — Telephone Encounter (Signed)
New Rx faxed to [facility]

## 2013-08-25 NOTE — Progress Notes (Signed)
Subjective:    Patient ID: Ryan Arellano, male    DOB: 1922-03-12, 80 y.o.   MRN: [id number]  HPI Due tetnus and Prevnar Pain control Has DNR    Review of Systems  Eyes: Positive for redness.  Respiratory: Positive for chest tightness and wheezing.   Cardiovascular: Positive for leg swelling.  Gastrointestinal: Positive for constipation and abdominal distention.  Musculoskeletal: Positive for joint swelling.  Neurological: Positive for numbness.       Post herpetic neurlgia   Past Medical History  Diagnosis Date  . Pulmonary hypertension   . Atrial fibrillation   . Thrombocytopenia   . Anemia   . Arthritis   . Diverticulitis   . Hypertension   . Renal insufficiency   . Skin lesion of back     History   Social History  . Marital Status: Widowed    Spouse Name: N/A    Number of Children: N/A  . Years of Education: N/A   Occupational History  . retired    Social History Main Topics  . Smoking status: Former Smoker -- 0.50 packs/day for 40 years    Types: Cigarettes    Quit date: 04/11/1979  . Smokeless tobacco: Never Used  . Alcohol Use: Yes     Comment: once per week  . Drug Use: No  . Sexual Activity: No   Other Topics Concern  . Not on file   Social History Narrative  . No narrative on file    Past Surgical History  Procedure Laterality Date  . Ruptured colon  1980  . S/p colectomy  1980  . Tonsillectomy    . Av fistula placement  2002    left arm    Family History  Problem Relation Age of Onset  . Heart disease Father   . Hypertension Father   . Heart disease Mother     No Known Allergies  Current Outpatient Prescriptions on File Prior to Visit  Medication Sig Dispense Refill  . acetaminophen (TYLENOL) 500 MG tablet Take 500 mg by mouth every 6 (six) hours as needed.      Marland Kitchen anagrelide (AGRYLIN) 0.5 MG capsule TAKE 5 CAPSULES BY MOUTH ONCE DAILY  450 capsule  0  . aspirin 81 MG tablet Take 81 mg by mouth daily.        . calcium  acetate (PHOSLO) 667 MG capsule Take 667 mg by mouth 3 (three) times daily with meals.       . colchicine 0.6 MG tablet Take 0.6 mg by mouth 2 (two) times daily as needed.       . diclofenac sodium (VOLTAREN) 1 % GEL Apply 2 g topically 4 (four) times daily.  100 g  11  . febuxostat (ULORIC) 40 MG tablet Take 40 mg by mouth daily.       . folic acid (FOLVITE) 1 MG tablet TAKE 1 TABLET BY MOUTH TWICE DAILY  180 tablet  2  . lidocaine (LIDODERM) 5 % Place 3 patches onto the skin daily. Place in areas of greatest pain  Up to three patches a day  Remove & Discard patches within 12 hours  90 patch  3  . midodrine (PROAMATINE) 10 MG tablet Take 10 mg by mouth 3 (three) times daily.      Marland Kitchen omeprazole (PRILOSEC) 40 MG capsule Take 40 mg by mouth daily.      Marland Kitchen oxycodone (OXY-IR) 5 MG capsule Take 1 capsule (5 mg total) by mouth 3 (three)  times daily as needed. Tablet please  45 capsule  0  . polyethylene glycol powder (GLYCOLAX/MIRALAX) powder Take 17 g by mouth 2 (two) times daily as needed.  3350 g  1  . pregabalin (LYRICA) 50 MG capsule Take 2 capsules (100 mg total) by mouth at bedtime.  60 capsule  3  . senna (SENOKOT) 8.6 MG tablet Take 1 tablet by mouth daily.      . vitamin C (ASCORBIC ACID) 250 MG tablet Take 250 mg by mouth daily.       No current facility-administered medications on file prior to visit.    BP 120/60  Pulse 97  Temp(Src) 97.4 F (36.3 C) (Oral)  Ht 5\' 5"  (1.651 m)  Wt 127 lb (57.607 kg)  BMI 21.13 kg/m2  SpO2 97%        Objective:   Physical Exam  Constitutional: No distress.  HENT:  Head: Atraumatic.  Eyes: Conjunctivae are normal. Pupils are equal, round, and reactive to light.  Neck: Neck supple. No tracheal deviation present. No thyromegaly present.  Cardiovascular: Regular rhythm.   Murmur heard. 2 + to mid calve   Pulmonary/Chest: Breath sounds normal. He has no wheezes.  Abdominal: Bowel sounds are normal. He exhibits distension.  Musculoskeletal:  He exhibits edema and tenderness.  Skin: He is not diaphoretic.          Assessment & Plan:  Increased edema and CHF  Fluid management with dialysis Slightly wet today... Dialysis Tuesday   ( this is the longer interval between dialysis)   persistant post herpetic pain  DNR  Discussion about transition to new md and plan to send records and call

## 2013-08-25 NOTE — Patient Instructions (Signed)
The patient is instructed to continue all medications as prescribed. Schedule followup with check out clerk upon leaving the clinic  

## 2013-08-25 NOTE — Progress Notes (Signed)
Pre visit review using our clinic review tool, if applicable. No additional management support is needed unless otherwise documented below in the visit note. 

## 2013-08-25 NOTE — Telephone Encounter (Signed)
Magda Paganini called to ask if dr could increase pt's pt med to a full tab  oxycodone (OXY-IR) 5 MG capsule  The [facility] states pt moans alot of the time like he is in so much pain. She also wants to ensure you feel pt will be ok to take a whole tab.  [facility] states pt seems to be hurting  so bad.    [employer] fax [phone]

## 2013-08-26 NOTE — Telephone Encounter (Signed)
Per Dr Vista Mink, okay to use labs from dialysis on 07/31/13.  Left msg with pt's daughter.  SLJ

## 2013-08-27 ENCOUNTER — Other Ambulatory Visit: Payer: Medicare Other

## 2013-08-27 ENCOUNTER — Telehealth: Payer: Self-pay | Admitting: *Deleted

## 2013-08-27 ENCOUNTER — Encounter: Payer: Self-pay | Admitting: Internal Medicine

## 2013-08-27 ENCOUNTER — Telehealth: Payer: Self-pay | Admitting: Internal Medicine

## 2013-08-27 ENCOUNTER — Ambulatory Visit (HOSPITAL_BASED_OUTPATIENT_CLINIC_OR_DEPARTMENT_OTHER): Payer: Medicare Other | Admitting: Internal Medicine

## 2013-08-27 VITALS — BP 130/60 | HR 113 | Temp 97.2°F | Resp 20 | Ht 65.0 in | Wt 128.5 lb

## 2013-08-27 DIAGNOSIS — D473 Essential (hemorrhagic) thrombocythemia: Secondary | ICD-10-CM

## 2013-08-27 DIAGNOSIS — Z992 Dependence on renal dialysis: Secondary | ICD-10-CM

## 2013-08-27 DIAGNOSIS — I4891 Unspecified atrial fibrillation: Secondary | ICD-10-CM

## 2013-08-27 MED ORDER — ANAGRELIDE HCL 0.5 MG PO CAPS: ORAL_CAPSULE | ORAL | Status: DC

## 2013-08-27 NOTE — Telephone Encounter (Signed)
Gave pt appt for MD for July, labs are drwn by dialysis per daughter

## 2013-08-27 NOTE — Progress Notes (Signed)
[place] Telephone:([phone]) [phone]   Fax:([phone]  OFFICE PROGRESS NOTE  Georgetta Haber, MD [place] [address]  DIAGNOSIS: Essential thrombocythemia   CURRENT THERAPY: Anagrelide 2.0 mg by mouth daily.  INTERVAL HISTORY: Ryan Arellano 80 y.o. male returns to the clinic today for followup visit accompanied his daughter. He also complains of persistent pain secondary to history of herpes zoster and he is currently on Neurontin and pain medication. The patient continues to have increased fatigue. He is undergoing hemodialysis under the care of Dr. Lorrene Reid. He denied having any significant bleeding issues. He is currently on anagrelide 2.0 mg by mouth daily and tolerating it fairly well. He has repeat CBC performed at the dialysis center few weeks ago and his platelets count was down to 51,000.The patient is here today for evaluation and discussion of his lab results and recommendation regarding treatment of the essential thrombocythemia.  MEDICAL HISTORY: Past Medical History  Diagnosis Date  . Pulmonary hypertension   . Atrial fibrillation   . Thrombocytopenia   . Anemia   . Arthritis   . Diverticulitis   . Hypertension   . Renal insufficiency   . Skin lesion of back     ALLERGIES:  has No Known Allergies.  MEDICATIONS:  Current Outpatient Prescriptions  Medication Sig Dispense Refill  . acetaminophen (TYLENOL) 500 MG tablet Take 500 mg by mouth every 6 (six) hours as needed.      Marland Kitchen anagrelide (AGRYLIN) 0.5 MG capsule TAKE 5 CAPSULES BY MOUTH ONCE DAILY  450 capsule  0  . aspirin 81 MG tablet Take 81 mg by mouth daily.        . calcium acetate (PHOSLO) 667 MG capsule Take 667 mg by mouth 3 (three) times daily with meals.       . colchicine 0.6 MG tablet Take 0.6 mg by mouth 2 (two) times daily as needed.       . diclofenac sodium (VOLTAREN) 1 % GEL Apply 2 g topically 4 (four) times daily.  100 g  11  . febuxostat  (ULORIC) 40 MG tablet Take 40 mg by mouth daily.       . folic acid (FOLVITE) 1 MG tablet TAKE 1 TABLET BY MOUTH TWICE DAILY  180 tablet  2  . lidocaine (LIDODERM) 5 % Place 3 patches onto the skin daily. Place in areas of greatest pain  Up to three patches a day  Remove & Discard patches within 12 hours  90 patch  3  . midodrine (PROAMATINE) 10 MG tablet Take 10 mg by mouth 3 (three) times daily.      Marland Kitchen omeprazole (PRILOSEC) 40 MG capsule Take 40 mg by mouth daily.      Marland Kitchen oxycodone (OXY-IR) 5 MG capsule Take 1 capsule (5 mg total) by mouth 3 (three) times daily as needed. Tablet please  45 capsule  0  . polyethylene glycol powder (GLYCOLAX/MIRALAX) powder Take 17 g by mouth 2 (two) times daily as needed.  3350 g  1  . pregabalin (LYRICA) 50 MG capsule Take 2 capsules (100 mg total) by mouth at bedtime.  60 capsule  3  . senna (SENOKOT) 8.6 MG tablet Take 1 tablet by mouth daily.      . vitamin C (ASCORBIC ACID) 250 MG tablet Take 250 mg by mouth daily.       No current facility-administered medications for this visit.    SURGICAL HISTORY:  Past  Surgical History  Procedure Laterality Date  . Ruptured colon  1980  . S/p colectomy  1980  . Tonsillectomy    . Av fistula placement  2002    left arm    REVIEW OF SYSTEMS:  Constitutional: positive for fatigue Musculoskeletal:positive for back pain   PHYSICAL EXAMINATION: General appearance: alert, cooperative, fatigued and no distress Head: Normocephalic, without obvious abnormality, atraumatic Neck: no adenopathy Lymph nodes: Cervical, supraclavicular, and axillary nodes normal. Resp: clear to auscultation bilaterally Cardio: regular rate and rhythm, S1, S2 normal, no murmur, click, rub or gallop GI: soft, non-tender; bowel sounds normal; no masses,  no organomegaly Extremities: extremities normal, atraumatic, no cyanosis or edema  ECOG PERFORMANCE STATUS: 2 - Symptomatic, <50% confined to bed  Blood pressure 130/60, pulse 113,  temperature 97.2 F (36.2 C), temperature source Oral, resp. rate 20, height 5\' 5"  (1.651 m), weight 128 lb 8 oz (58.287 kg).  LABORATORY DATA: Platelets count 51,000   RADIOGRAPHIC STUDIES: No results found.  ASSESSMENT AND PLAN: This is a very pleasant 80 years old white male with history of essential thrombocythemia currently on anagrelide 2.0 mg by mouth daily and the last CBC performed at at the dialysis center showed platelets count of 51,000  I recommended for the patient to continue anagrelide but I would reduce the dose to 1.0 mg by mouth daily. He'll continue to have repeat CBC as scheduled at the dialysis center. I will see the patient back for followup visit in 1 months for reevaluation and further adjustment of his dose of anagrelide based on the platelets count.   He was advised to call immediately if he has any concerning symptoms or significant abnormality in his platelet count.  The patient voices understanding of current disease status and treatment options and is in agreement with the current care plan.  All questions were answered. The patient knows to call the clinic with any problems, questions or concerns. We can certainly see the patient much sooner if necessary.  Disclaimer: This note was dictated with voice recognition software. Similar sounding words can inadvertently be transcribed and may not be corrected upon review.

## 2013-08-27 NOTE — Telephone Encounter (Signed)
Ryan Arellano called from [facility] called to say that she needs the instructions for rx  oxycodone (OXY-IR) 5 MG capsule to say every 8 hours as needed so that the med techs can have a  perimeter as to when to give .

## 2013-08-27 NOTE — Telephone Encounter (Signed)
Order for new dose of anagrelide faxed to [facility] facility per pt's daughter request to Elveria Rising at [phone].  SLJ

## 2013-08-28 NOTE — Telephone Encounter (Signed)
Increase to 1 tab every 6 hours prn pain

## 2013-08-29 MED ORDER — OXYCODONE HCL 5 MG PO CAPS: 5.0000 mg | ORAL_CAPSULE | Freq: Four times a day (QID) | ORAL | Status: DC | PRN

## 2013-08-29 NOTE — Telephone Encounter (Addendum)
Called and spoke with [place] and new rx needs to be faxed to [phone]; new rx faxed.

## 2013-08-29 NOTE — Addendum Note (Signed)
Addended by: Colleen Can on: 08/29/2013 04:37 PM   Modules accepted: Orders

## 2013-08-29 NOTE — Telephone Encounter (Signed)
Attempted to call [facility] and was on the phone for 25 minutes with no answer.

## 2013-08-29 NOTE — Telephone Encounter (Signed)
[place] and spoke with

## 2013-09-05 ENCOUNTER — Telehealth: Payer: Self-pay | Admitting: Internal Medicine

## 2013-09-05 ENCOUNTER — Encounter: Payer: Self-pay | Admitting: Physician Assistant

## 2013-09-05 ENCOUNTER — Ambulatory Visit (INDEPENDENT_AMBULATORY_CARE_PROVIDER_SITE_OTHER): Payer: Medicare Other | Admitting: Physician Assistant

## 2013-09-05 VITALS — BP 90/50 | HR 78 | Temp 97.5°F | Resp 18 | Ht 65.0 in

## 2013-09-05 DIAGNOSIS — L0390 Cellulitis, unspecified: Secondary | ICD-10-CM

## 2013-09-05 DIAGNOSIS — L0291 Cutaneous abscess, unspecified: Secondary | ICD-10-CM

## 2013-09-05 MED ORDER — DOXYCYCLINE HYCLATE 100 MG PO TABS: 100.0000 mg | ORAL_TABLET | Freq: Two times a day (BID) | ORAL | Status: DC

## 2013-09-05 NOTE — Progress Notes (Signed)
Pre visit review using our clinic review tool, if applicable. No additional management support is needed unless otherwise documented below in the visit note. 

## 2013-09-05 NOTE — Progress Notes (Signed)
Subjective:    Patient ID: Ryan Arellano, male    DOB: 1921-07-04, 80 y.o.   MRN: [id number]  HPI Patient is an 80 year old Caucasian male presenting to the clinic for swelling and redness of his leg. Patient is with his 2 daughters who help with the history. Patient states that since yesterday he has had some swelling and redness of his left leg. He has 3 lesions on his leg overlying the redness. His daughters believe that from his dialysis he scratches his skin a lot and probably cut himself with scratching his legs. He stays at an assisted living facility. He has not tried anything for this. He denies fevers, chills, nausea, vomiting, diarrhea, and shortness of breath.   Review of Systems As per the history of present illness and otherwise negative.   Past Medical History  Diagnosis Date  . Pulmonary hypertension   . Atrial fibrillation   . Thrombocytopenia   . Anemia   . Arthritis   . Diverticulitis   . Hypertension   . Renal insufficiency   . Skin lesion of back    Past Surgical History  Procedure Laterality Date  . Ruptured colon  1980  . S/p colectomy  1980  . Tonsillectomy    . Av fistula placement  2002    left arm    reports that he quit smoking about 34 years ago. His smoking use included Cigarettes. He has a 20 pack-year smoking history. He has never used smokeless tobacco. He reports that he drinks alcohol. He reports that he does not use illicit drugs. family history includes Heart disease in his father and mother; Hypertension in his father. No Known Allergies     Objective:   Physical Exam  Nursing note and vitals reviewed. Constitutional: He is oriented to person, place, and time. He appears well-developed and well-nourished. No distress.  HENT:  Head: Normocephalic and atraumatic.  Eyes: Conjunctivae and EOM are normal. Pupils are equal, round, and reactive to light.  Neck: Normal range of motion. Neck supple.  Cardiovascular: Normal rate, regular  rhythm, normal heart sounds and intact distal pulses.  Exam reveals no gallop and no friction rub.   No murmur heard. Pulmonary/Chest: Effort normal and breath sounds normal. No respiratory distress. He has no wheezes. He has no rales. He exhibits no tenderness.  Musculoskeletal: Normal range of motion. He exhibits edema ( 2+ pitting edema bilateral lower extremities) and tenderness (Limited to the area of erythema on the left leg).  Patient is in a wheelchair  Neurological: He is alert and oriented to person, place, and time.  Skin: Skin is warm and dry. No rash noted. He is not diaphoretic. There is erythema. No pallor.  Left lower leg: There is approximately 5 cm diameter area of erythema on the anterior aspect of the left lower leg. There are 3 vertical linear subcentimeter open sores overlying the redness. These are oozing some serous fluid. There is tenderness to palpation of the area. There is no fluctuance or visible foreign body.  Psychiatric: He has a normal mood and affect. His behavior is normal. Judgment and thought content normal.    Filed Vitals:   09/05/13 1447  BP: 90/50  Pulse: 78  Temp: 97.5 F (36.4 C)  TempSrc: Oral  Resp: 18  Height: 5\' 5"  (1.651 m)  SpO2: 89%   Lab Results  Component Value Date   WBC 15.2* 06/02/2013   HGB 11.7* 06/02/2013   HCT 37.1* 06/02/2013   PLT  281 06/02/2013   GLUCOSE 89 06/02/2013   ALT 17 06/02/2013   AST 18 06/02/2013   NA 141 06/02/2013   K 4.5 06/02/2013   CL 95* 10/08/2012   CREATININE 6.5* 06/02/2013   BUN 93.6* 06/02/2013   CO2 21* 06/02/2013   TSH 3.26 02/21/2010       Assessment & Plan:  [name] was seen today for no specified reason.  Diagnoses and associated orders for this visit:  Cellulitis Comments: Patient currently on hemodialysis. Will use Doxy to treat - doxycycline (VIBRA-TABS) 100 MG tablet; Take 1 tablet (100 mg total) by mouth 2 (two) times daily.   - SpO2 89%- No SOB or dyspnea complaints, no worrisome signs  on lung exam. Will reassess at follow up.  - Edema- will have pt increase leg raises to help reduce swelling.  Plan to follow up in 1 week to reassess.  Patient Instructions  Doxycycline twice a day for 10 days for cellulitis.  Make sure that the area stays clean. You can use topical antibiotic ointments and bandages to cover the area. The bandages should be changed at least daily if not twice a day.  Increase leg raises to help reduce leg swelling.  Followup in one week to reassess. Followup sooner if symptoms worsen or fail to improve despite treatment.

## 2013-09-05 NOTE — Patient Instructions (Addendum)
Doxycycline twice a day for 10 days for cellulitis.  Make sure that the area stays clean. You can use topical antibiotic ointments and bandages to cover the area. The bandages should be changed at least daily if not twice a day.  Increase leg raises to help reduce leg swelling.  Followup in one week to reassess. Followup sooner if symptoms worsen or fail to improve despite treatment.   Cellulitis Cellulitis is an infection of the skin and the tissue under the skin. The infected area is usually red and tender. This happens most often in the arms and lower legs. HOME CARE   Take your antibiotic medicine as told. Finish the medicine even if you start to feel better.  Keep the infected arm or leg raised (elevated).  Put a warm cloth on the area up to 4 times per day.  Only take medicines as told by your doctor.  Keep all doctor visits as told. GET HELP RIGHT AWAY IF:   You have a fever.  You feel very sleepy.  You throw up (vomit) or have watery poop (diarrhea).  You feel sick and have muscle aches and pains.  You see red streaks on the skin coming from the infected area.  Your red area gets bigger or turns a dark color.  Your bone or joint under the infected area is painful after the skin heals.  Your infection comes back in the same area or different area.  You have a puffy (swollen) bump in the infected area.  You have new symptoms. MAKE SURE YOU:   Understand these instructions.  Will watch your condition.  Will get help right away if you are not doing well or get worse. Document Released: 09/13/2007 Document Revised: 09/26/2011 Document Reviewed: 06/12/2011 [facility] Patient Information 2014 [place], [place].

## 2013-09-05 NOTE — Telephone Encounter (Signed)
Patient Information:  Caller Name: Butch Penny  Phone: [phone]  Patient: Ryan Arellano, Ryan Arellano  Gender: Male  DOB: 08-Jan-1922  Age: 80 Years  PCP: Benay Pillow (Adults only)  Office Follow Up:  Does the office need to follow up with this patient?: No  Instructions For The Office: N/A   Symptoms  Reason For Call & Symptoms:L lower leg is swollen and has 2 areas on  leg that may be infected. He has one scabbed area and one open spot and shin is tender to touch. Lower leg is bright red and feels hot. Afebrile. Sx started on 09/04/13.   Reviewed Health History In EMR: Yes  Reviewed Medications In EMR: Yes  Reviewed Allergies In EMR: Yes  Reviewed Surgeries / Procedures: Yes  Date of Onset of Symptoms: 09/04/2013  Guideline(s) Used:  Leg Swelling and Edema  Disposition Per Guideline:   See Today in Office  Reason For Disposition Reached:   Looks like a boil, infected sore, deep ulcer, or other infected rash (spreading redness, pus)  Advice Given:  Expected Course:  If your leg swelling does not get better during the next week or if it recurs, make an appointment with your doctor.  Call Back If:  Swelling becomes worse  Swelling becomes red or painful to the touch  Calf pain occurs and becomes constant  You become worse.  Patient Will Follow Care Advice:  YES  Appointment Scheduled:  09/05/2013 14:30:00 Appointment Scheduled Provider:  Kela Millin

## 2013-09-11 ENCOUNTER — Telehealth: Payer: Self-pay | Admitting: Medical Oncology

## 2013-09-11 NOTE — Telephone Encounter (Signed)
Asking if labs received from dialysis. Called for labs and given to [facility] for review.

## 2013-09-12 ENCOUNTER — Encounter: Payer: Self-pay | Admitting: Physician Assistant

## 2013-09-12 ENCOUNTER — Ambulatory Visit (INDEPENDENT_AMBULATORY_CARE_PROVIDER_SITE_OTHER): Payer: Medicare Other | Admitting: Physician Assistant

## 2013-09-12 VITALS — BP 114/60 | HR 81 | Temp 97.3°F | Resp 18 | Wt 123.0 lb

## 2013-09-12 DIAGNOSIS — L0390 Cellulitis, unspecified: Secondary | ICD-10-CM

## 2013-09-12 DIAGNOSIS — Z09 Encounter for follow-up examination after completed treatment for conditions other than malignant neoplasm: Secondary | ICD-10-CM

## 2013-09-12 DIAGNOSIS — L0291 Cutaneous abscess, unspecified: Secondary | ICD-10-CM

## 2013-09-12 NOTE — Progress Notes (Signed)
Pre visit review using our clinic review tool, if applicable. No additional management support is needed unless otherwise documented below in the visit note. 

## 2013-09-12 NOTE — Patient Instructions (Signed)
Continue the doxycycline and the you have finished the course.  Continue to monitor the area for appropriate healing.  If the area seems to be becoming more red, more swollen, more tender, or excessively warm to touch, seek medical attention.  Followup in 2-4 weeks to reassess, or sooner if symptoms worsen or fail to improve despite treatment.    Cellulitis Cellulitis is an infection of the skin and the tissue under the skin. The infected area is usually red and tender. This happens most often in the arms and lower legs. HOME CARE   Take your antibiotic medicine as told. Finish the medicine even if you start to feel better.  Keep the infected arm or leg raised (elevated).  Put a warm cloth on the area up to 4 times per day.  Only take medicines as told by your doctor.  Keep all doctor visits as told. GET HELP RIGHT AWAY IF:   You have a fever.  You feel very sleepy.  You throw up (vomit) or have watery poop (diarrhea).  You feel sick and have muscle aches and pains.  You see red streaks on the skin coming from the infected area.  Your red area gets bigger or turns a dark color.  Your bone or joint under the infected area is painful after the skin heals.  Your infection comes back in the same area or different area.  You have a puffy (swollen) bump in the infected area.  You have new symptoms. MAKE SURE YOU:   Understand these instructions.  Will watch your condition.  Will get help right away if you are not doing well or get worse. Document Released: 09/13/2007 Document Revised: 09/26/2011 Document Reviewed: 06/12/2011 [facility] Patient Information 2014 [place], [place].

## 2013-09-12 NOTE — Progress Notes (Signed)
Subjective:    Patient ID: Ryan Arellano, male    DOB: Mar 02, 1922, 80 y.o.   MRN: [id number]  HPI Patient is an 80 year old Caucasian male presenting for followup of cellulitis. Patient was seen last week for cellulitis of his lower left leg, and prescribed doxycycline for 10 days. Patient is currently on day 7 of 10 of his doxycycline treatment. He's been tolerating the medication well, he denies any adverse effects. Patient states that his leg is still a little sore, however the swelling has gone down and the redness and warmth are decreased. Assisted living has been changing the bandages over the open and oozing sore over the cellulitis daily. He has also increased his daily leg raises which has significantly reduced his edema in both of his legs. He denies fevers, chills, nausea, vomiting, diarrhea, and shortness of breath.   Review of Systems As per HPI and otherwise negative.    Past Medical History  Diagnosis Date  . Pulmonary hypertension   . Atrial fibrillation   . Thrombocytopenia   . Anemia   . Arthritis   . Diverticulitis   . Hypertension   . Renal insufficiency   . Skin lesion of back    Past Surgical History  Procedure Laterality Date  . Ruptured colon  1980  . S/p colectomy  1980  . Tonsillectomy    . Av fistula placement  2002    left arm    reports that he quit smoking about 34 years ago. His smoking use included Cigarettes. He has a 20 pack-year smoking history. He has never used smokeless tobacco. He reports that he drinks alcohol. He reports that he does not use illicit drugs. family history includes Heart disease in his father and mother; Hypertension in his father. No Known Allergies     Objective:   Physical Exam  Nursing note and vitals reviewed. Constitutional: He is oriented to person, place, and time. He appears well-developed and well-nourished. No distress.  HENT:  Head: Normocephalic and atraumatic.  Eyes: Conjunctivae and EOM are normal.  Pupils are equal, round, and reactive to light.  Neck: Normal range of motion. Neck supple.  Cardiovascular: Normal rate, regular rhythm, normal heart sounds and intact distal pulses.  Exam reveals no gallop and no friction rub.   No murmur heard. Pulmonary/Chest: Effort normal and breath sounds normal. No respiratory distress. He has no wheezes. He has no rales. He exhibits no tenderness.  Musculoskeletal: Normal range of motion. He exhibits edema (less than 1+ pitting edema, LLE.) and tenderness (limited to area of cellulitis.).  Neurological: He is alert and oriented to person, place, and time.  Skin: Skin is warm and dry. No rash noted. He is not diaphoretic. There is erythema (limited). No pallor.  Left lower leg: There is approximately 4 to 5 cm diameter area of mild erythema on the anterior aspect of the left lower leg. There are still 3 vertical linear subcentimeter open sores overlying the redness. These are oozing some serous fluid. There is mild tenderness to palpation of the area. There is no fluctuance or visible foreign body.   Psychiatric: He has a normal mood and affect. His behavior is normal. Judgment and thought content normal.    Filed Vitals:   09/12/13 1323  BP: 114/60  Pulse: 81  Temp: 97.3 F (36.3 C)  TempSrc: Oral  Resp: 18  Weight: 123 lb (55.792 kg)  SpO2: 96%   Lab Results  Component Value Date   WBC 15.2*  06/02/2013   HGB 11.7* 06/02/2013   HCT 37.1* 06/02/2013   PLT 281 06/02/2013   GLUCOSE 89 06/02/2013   ALT 17 06/02/2013   AST 18 06/02/2013   NA 141 06/02/2013   K 4.5 06/02/2013   CL 95* 10/08/2012   CREATININE 6.5* 06/02/2013   BUN 93.6* 06/02/2013   CO2 21* 06/02/2013   TSH 3.26 02/21/2010       Assessment & Plan:  Arlander was seen today for follow-up.  Diagnoses and associated orders for this visit:  Follow up Comments: Improving.  Cellulitis Comments: Continue Doxy until completed course.   SpO2 96% today, greatly improved from 89% last  week. Still no respiratory symptoms, continue to monitor.  Return precautions provided.  Plan to follow up in 2 to 4 weeks to reassess, or sooner for worsening or persistent symptoms.   Patient Instructions  Continue the doxycycline and the you have finished the course.  Continue to monitor the area for appropriate healing.  If the area seems to be becoming more red, more swollen, more tender, or excessively warm to touch, seek medical attention.  Followup in 2-4 weeks to reassess, or sooner if symptoms worsen or fail to improve despite treatment.

## 2013-09-15 ENCOUNTER — Telehealth: Payer: Self-pay | Admitting: *Deleted

## 2013-09-15 NOTE — Telephone Encounter (Signed)
Received labs from [facility] from [facility] dated 09/11/13.  Labs reviewed by Dr Vista Mink, per Dr Vista Mink pt is to continue the same dose of anagrelide.  Left msg with pt's daughter with information.  SLJ

## 2013-09-15 NOTE — Telephone Encounter (Signed)
WBC 15.97, RBC 3.61, hbg 10.4, Plts 244.  SLJ

## 2013-09-27 ENCOUNTER — Other Ambulatory Visit: Payer: Self-pay | Admitting: Internal Medicine

## 2013-10-06 ENCOUNTER — Ambulatory Visit (INDEPENDENT_AMBULATORY_CARE_PROVIDER_SITE_OTHER): Payer: Medicare Other | Admitting: Physician Assistant

## 2013-10-06 ENCOUNTER — Encounter: Payer: Self-pay | Admitting: Physician Assistant

## 2013-10-06 VITALS — BP 114/60 | HR 77 | Temp 97.5°F | Resp 18 | Wt 123.0 lb

## 2013-10-06 DIAGNOSIS — L03119 Cellulitis of unspecified part of limb: Secondary | ICD-10-CM

## 2013-10-06 DIAGNOSIS — Z09 Encounter for follow-up examination after completed treatment for conditions other than malignant neoplasm: Secondary | ICD-10-CM

## 2013-10-06 DIAGNOSIS — L02419 Cutaneous abscess of limb, unspecified: Secondary | ICD-10-CM

## 2013-10-06 DIAGNOSIS — L03116 Cellulitis of left lower limb: Secondary | ICD-10-CM

## 2013-10-06 NOTE — Patient Instructions (Signed)
Continue to elevate legs daily above the level of the heart to decrease leg swelling.  Apply bacitracin ointment over open wounds to prevent reinfection. Once scabbed, keep clean and dry. Keep covered to prevent scratching/removing scab.  Schedule appointment today to see Dr. Yong Channel within the next 2 months.  If emergency symptoms discussed during visit developed, seek medical attention immediately.  Followup as needed, or for worsening or persistent symptoms despite treatment.    Cellulitis Cellulitis is an infection of the skin and the tissue beneath it. The infected area is usually red and tender. Cellulitis occurs most often in the arms and lower legs.  CAUSES  Cellulitis is caused by bacteria that enter the skin through cracks or cuts in the skin. The most common types of bacteria that cause cellulitis are Staphylococcus and Streptococcus. SYMPTOMS   Redness and warmth.  Swelling.  Tenderness or pain.  Fever. DIAGNOSIS  Your caregiver can usually determine what is wrong based on a physical exam. Blood tests may also be done. TREATMENT  Treatment usually involves taking an antibiotic medicine. HOME CARE INSTRUCTIONS   Take your antibiotics as directed. Finish them even if you start to feel better.  Keep the infected arm or leg elevated to reduce swelling.  Apply a warm cloth to the affected area up to 4 times per day to relieve pain.  Only take over-the-counter or prescription medicines for pain, discomfort, or fever as directed by your caregiver.  Keep all follow-up appointments as directed by your caregiver. SEEK MEDICAL CARE IF:   You notice red streaks coming from the infected area.  Your red area gets larger or turns dark in color.  Your bone or joint underneath the infected area becomes painful after the skin has healed.  Your infection returns in the same area or another area.  You notice a swollen bump in the infected area.  You develop new  symptoms. SEEK IMMEDIATE MEDICAL CARE IF:   You have a fever.  You feel very sleepy.  You develop vomiting or diarrhea.  You have a general ill feeling (malaise) with muscle aches and pains. MAKE SURE YOU:   Understand these instructions.  Will watch your condition.  Will get help right away if you are not doing well or get worse. Document Released: 01/04/2005 Document Revised: 09/26/2011 Document Reviewed: 06/12/2011 [facility] Patient Information 2015 [place], [place]. This information is not intended to replace advice given to you by your health care provider. Make sure you discuss any questions you have with your health care provider.

## 2013-10-06 NOTE — Progress Notes (Signed)
Subjective:    Patient ID: Ryan Arellano, male    DOB: 1921/04/23, 80 y.o.   MRN: [id number]  HPI Patient is an 80 year old Caucasian male presenting to the clinic for followup of cellulitis. Patient was initially seen 09/05/2013, diagnosed with cellulitis of the left lower leg, place on doxycycline course. Patient followed up on 09/12/2013, swelling and leg had improved with leg raises, and antibiotics had decreased redness and pain over the cellulitic area, however course had not been completed entirely, and some symptoms were still present. Patient presents today stating that symptoms have completely resolved, however he is experiencing edema in a can, and his scabs over the cellulitic area had not completely healed. Patient admits to scratching the scabs frequently, and picking at them, removing them. He has also not been elevating his legs as much as he had initially been doing. He is not completely course of doxycycline, stating he tolerated it well, he denied adverse effects. He denies fevers, chills, nausea, vomiting, diarrhea, shortness of breath.   Review of Systems As per history of present illness and are otherwise negative.  Past Medical History  Diagnosis Date  . Pulmonary hypertension   . Atrial fibrillation   . Thrombocytopenia   . Anemia   . Arthritis   . Diverticulitis   . Hypertension   . Renal insufficiency   . Skin lesion of back     History   Social History  . Marital Status: Widowed    Spouse Name: N/A    Number of Children: N/A  . Years of Education: N/A   Occupational History  . retired    Social History Main Topics  . Smoking status: Former Smoker -- 0.50 packs/day for 40 years    Types: Cigarettes    Quit date: 04/11/1979  . Smokeless tobacco: Never Used  . Alcohol Use: Yes     Comment: once per week  . Drug Use: No  . Sexual Activity: No   Other Topics Concern  . Not on file   Social History Narrative  . No narrative on file    Past  Surgical History  Procedure Laterality Date  . Ruptured colon  1980  . S/p colectomy  1980  . Tonsillectomy    . Av fistula placement  2002    left arm    Family History  Problem Relation Age of Onset  . Heart disease Father   . Hypertension Father   . Heart disease Mother     No Known Allergies  Current Outpatient Prescriptions on File Prior to Visit  Medication Sig Dispense Refill  . acetaminophen (TYLENOL) 500 MG tablet Take 500 mg by mouth every 6 (six) hours as needed.      Marland Kitchen anagrelide (AGRYLIN) 0.5 MG capsule 2 tab po qd  120 capsule  1  . aspirin 81 MG tablet Take 81 mg by mouth daily.        . calcium acetate (PHOSLO) 667 MG capsule Take 667 mg by mouth 3 (three) times daily with meals.       . COLCRYS 0.6 MG tablet TAKE 1 TABLET BY MOUTH TWICE A DAY  60 tablet  1  . diclofenac sodium (VOLTAREN) 1 % GEL Apply 2 g topically 4 (four) times daily.  100 g  11  . febuxostat (ULORIC) 40 MG tablet Take 40 mg by mouth daily.       . folic acid (FOLVITE) 1 MG tablet TAKE 1 TABLET BY MOUTH TWICE DAILY  180 tablet  2  . lidocaine (LIDODERM) 5 % Place 3 patches onto the skin daily. Place in areas of greatest pain  Up to three patches a day  Remove & Discard patches within 12 hours  90 patch  3  . midodrine (PROAMATINE) 10 MG tablet Take 10 mg by mouth 3 (three) times daily.      Marland Kitchen omeprazole (PRILOSEC) 40 MG capsule Take 40 mg by mouth daily.      Marland Kitchen oxycodone (OXY-IR) 5 MG capsule Take 1 capsule (5 mg total) by mouth every 6 (six) hours as needed for pain. Tablet please  90 capsule  0  . polyethylene glycol powder (GLYCOLAX/MIRALAX) powder Take 17 g by mouth 2 (two) times daily as needed.  3350 g  1  . pregabalin (LYRICA) 50 MG capsule Take 2 capsules (100 mg total) by mouth at bedtime.  60 capsule  3  . senna (SENOKOT) 8.6 MG tablet Take 1 tablet by mouth daily.      . vitamin C (ASCORBIC ACID) 250 MG tablet Take 250 mg by mouth daily.       No current facility-administered  medications on file prior to visit.    EXAM: BP 114/60  Pulse 77  Temp(Src) 97.5 F (36.4 C) (Oral)  Resp 18  Wt 123 lb (55.792 kg)  SpO2 94%     Objective:   Physical Exam Nursing note and vitals reviewed. Constitutional: He is oriented to person, place, and time. He appears well-developed and well-nourished. No distress.  HENT:  Head: Normocephalic and atraumatic.  Eyes: Conjunctivae and EOM are normal. Pupils are equal, round, and reactive to light.  Neck: Normal range of motion. Neck supple.  Cardiovascular: Normal rate, regular rhythm, and intact distal pulses.   Pulmonary/Chest: Effort normal and breath sounds normal. No respiratory distress. He exhibits no tenderness.  Musculoskeletal: Normal range of motion. He exhibits edema (less than 1+ pitting edema bilateral LE.) and tenderness (very mild, limited to scabbing area over LLE.).  Neurological: He is alert and oriented to person, place, and time.  Skin: Skin is warm and dry. No rash noted. He is not diaphoretic. There is erythema (limited). No pallor.  Left lower leg: There is approximately 4 cm diameter area of mild erythema on the anterior aspect of the left lower leg. There are still 3 vertical linear subcentimeter sores overlying the redness which are partially scabbed over. There is very mild tenderness to palpation of the area. There is no fluctuance or visible foreign body.   Psychiatric: He has a normal mood and affect. His behavior is normal. Judgment and thought content normal.   Lab Results  Component Value Date   WBC 15.2* 06/02/2013   HGB 11.7* 06/02/2013   HCT 37.1* 06/02/2013   PLT 281 06/02/2013   GLUCOSE 89 06/02/2013   ALT 17 06/02/2013   AST 18 06/02/2013   NA 141 06/02/2013   K 4.5 06/02/2013   CL 95* 10/08/2012   CREATININE 6.5* 06/02/2013   BUN 93.6* 06/02/2013   CO2 21* 06/02/2013   TSH 3.26 02/21/2010        Assessment & Plan:  Ryan Arellano was seen today for follow-up.  Diagnoses and associated  orders for this visit:  Follow up Comments: Overall much better. Some edema. Advised pt to elevate legs daily to reduce swelling.  Cellulitis of left lower extremity Comments: Resolved. Still scabbing, advised pt to keep covered and avoid scratching scabs to prevent reinfection.    Pt will  apply bacitracin ointment over open wounds to prevent reinfection. Once scabbed, keep clean and dry. Keep covered to prevent scratching/removing scab.  Pt will schedule appointment to establish with Dr. Yong Channel within the next 2 months at front today.  Return precautions provided, and patient handout on cellulitis.  Plan to follow up as needed, or for worsening or persistent symptoms despite treatment.  Patient Instructions  Continue to elevate legs daily above the level of the heart to decrease leg swelling.  Apply bacitracin ointment over open wounds to prevent reinfection. Once scabbed, keep clean and dry. Keep covered to prevent scratching/removing scab.  Schedule appointment today to see Dr. Yong Channel within the next 2 months.  If emergency symptoms discussed during visit developed, seek medical attention immediately.  Followup as needed, or for worsening or persistent symptoms despite treatment.

## 2013-10-06 NOTE — Progress Notes (Signed)
Pre visit review using our clinic review tool, if applicable. No additional management support is needed unless otherwise documented below in the visit note. 

## 2013-10-24 ENCOUNTER — Telehealth: Payer: Self-pay | Admitting: *Deleted

## 2013-10-24 NOTE — Telephone Encounter (Signed)
Received fax from [facility] with lab work done on 10/02/13, CBC and CMET.  Lab work will be reviewed by Dr Vista Mink for f/u 10/27/13.  SLJ

## 2013-10-27 ENCOUNTER — Telehealth: Payer: Self-pay | Admitting: Internal Medicine

## 2013-10-27 ENCOUNTER — Ambulatory Visit (HOSPITAL_BASED_OUTPATIENT_CLINIC_OR_DEPARTMENT_OTHER): Payer: Medicare Other | Admitting: Internal Medicine

## 2013-10-27 ENCOUNTER — Encounter: Payer: Self-pay | Admitting: Internal Medicine

## 2013-10-27 VITALS — BP 122/58 | HR 92 | Temp 97.2°F | Ht 65.0 in | Wt 125.6 lb

## 2013-10-27 DIAGNOSIS — R5381 Other malaise: Secondary | ICD-10-CM

## 2013-10-27 DIAGNOSIS — D473 Essential (hemorrhagic) thrombocythemia: Secondary | ICD-10-CM

## 2013-10-27 DIAGNOSIS — M549 Dorsalgia, unspecified: Secondary | ICD-10-CM

## 2013-10-27 DIAGNOSIS — R5383 Other fatigue: Secondary | ICD-10-CM

## 2013-10-27 NOTE — Telephone Encounter (Signed)
S/w the pt's daughter and she is aware of the oct appt.

## 2013-10-27 NOTE — Progress Notes (Signed)
[facility] [phone]   [phone]  OFFICE PROGRESS NOTE  Georgetta Haber, MD [place] [address]  DIAGNOSIS: Essential thrombocythemia   CURRENT THERAPY: Anagrelide 1.0 mg by mouth daily.  INTERVAL HISTORY: Ryan Arellano 80 y.o. male returns to the clinic today for followup visit accompanied his daughter. He also complains of persistent pain secondary to history of herpes zoster and he is currently on Neurontin and pain medication. He fell down recently and had fewer bruises in his on arms. He denied having any significant bleeding issues. He is currently on anagrelide 1.0 mg by mouth daily and tolerating it fairly well. He has repeat CBC performed at the dialysis center few weeks ago and his platelets count is now up to 455,000 The patient is here today for evaluation and discussion of his lab results and recommendation regarding treatment of the essential thrombocythemia.  MEDICAL HISTORY: Past Medical History  Diagnosis Date  . Pulmonary hypertension   . Atrial fibrillation   . Thrombocytopenia   . Anemia   . Arthritis   . Diverticulitis   . Hypertension   . Renal insufficiency   . Skin lesion of back     ALLERGIES:  has No Known Allergies.  MEDICATIONS:  Current Outpatient Prescriptions  Medication Sig Dispense Refill  . acetaminophen (TYLENOL) 500 MG tablet Take 500 mg by mouth every 6 (six) hours as needed.      Marland Kitchen anagrelide (AGRYLIN) 0.5 MG capsule 2 tab po qd  120 capsule  1  . aspirin 81 MG tablet Take 81 mg by mouth daily.        . calcium acetate (PHOSLO) 667 MG capsule Take 667 mg by mouth 3 (three) times daily with meals.       . COLCRYS 0.6 MG tablet TAKE 1 TABLET BY MOUTH TWICE A DAY  60 tablet  1  . diclofenac sodium (VOLTAREN) 1 % GEL Apply 2 g topically 4 (four) times daily.  100 g  11  . febuxostat (ULORIC) 40 MG tablet Take 40 mg by mouth daily.       . folic acid (FOLVITE) 1 MG  tablet TAKE 1 TABLET BY MOUTH TWICE DAILY  180 tablet  2  . lidocaine (LIDODERM) 5 % Place 3 patches onto the skin daily. Place in areas of greatest pain  Up to three patches a day  Remove & Discard patches within 12 hours  90 patch  3  . midodrine (PROAMATINE) 10 MG tablet Take 10 mg by mouth 3 (three) times daily.      Marland Kitchen omeprazole (PRILOSEC) 40 MG capsule Take 40 mg by mouth daily.      Marland Kitchen oxycodone (OXY-IR) 5 MG capsule Take 1 capsule (5 mg total) by mouth every 6 (six) hours as needed for pain. Tablet please  90 capsule  0  . polyethylene glycol powder (GLYCOLAX/MIRALAX) powder Take 17 g by mouth 2 (two) times daily as needed.  3350 g  1  . pregabalin (LYRICA) 50 MG capsule Take 2 capsules (100 mg total) by mouth at bedtime.  60 capsule  3  . senna (SENOKOT) 8.6 MG tablet Take 1 tablet by mouth daily.      . vitamin C (ASCORBIC ACID) 250 MG tablet Take 250 mg by mouth daily.       No current facility-administered medications for this visit.    SURGICAL HISTORY:  Past Surgical History  Procedure Laterality Date  .  Ruptured colon  1980  . S/p colectomy  1980  . Tonsillectomy    . Av fistula placement  2002    left arm    REVIEW OF SYSTEMS:  Constitutional: positive for fatigue Musculoskeletal:positive for back pain   PHYSICAL EXAMINATION: General appearance: alert, cooperative, fatigued and no distress Head: Normocephalic, without obvious abnormality, atraumatic Neck: no adenopathy Lymph nodes: Cervical, supraclavicular, and axillary nodes normal. Resp: clear to auscultation bilaterally Cardio: regular rate and rhythm, S1, S2 normal, no murmur, click, rub or gallop GI: soft, non-tender; bowel sounds normal; no masses,  no organomegaly Extremities: extremities normal, atraumatic, no cyanosis or edema  ECOG PERFORMANCE STATUS: 2 - Symptomatic, <50% confined to bed  Blood pressure 122/58, pulse 92, temperature 97.2 F (36.2 C), temperature source Oral, height 5\' 5"  (1.651 m),  weight 125 lb 9.6 oz (56.972 kg), SpO2 95.00%.  LABORATORY DATA: Platelets count 51,000   RADIOGRAPHIC STUDIES: No results found.  ASSESSMENT AND PLAN: This is a very pleasant 80 years old white male with history of essential thrombocythemia currently on anagrelide 1.0 mg by mouth daily and the last CBC performed at at the dialysis center showed platelets count of 455,000  I recommended for the patient to continue anagrelide but I would reduce the dose to 1.0 mg by mouth daily. He'll continue to have repeat CBC as scheduled at the dialysis center. I will see the patient back for followup visit in 3 months for reevaluation and further adjustment of his dose of anagrelide based on the platelets count.   He was advised to call immediately if he has any concerning symptoms or significant abnormality in his platelet count.  The patient voices understanding of current disease status and treatment options and is in agreement with the current care plan.  All questions were answered. The patient knows to call the clinic with any problems, questions or concerns. We can certainly see the patient much sooner if necessary.  Disclaimer: This note was dictated with voice recognition software. Similar sounding words can inadvertently be transcribed and may not be corrected upon review.

## 2013-10-31 ENCOUNTER — Telehealth: Payer: Self-pay | Admitting: Internal Medicine

## 2013-10-31 NOTE — Telephone Encounter (Signed)
Laureen from [facility] called to fine out about pt diet . She need this asap

## 2013-11-03 ENCOUNTER — Telehealth: Payer: Self-pay | Admitting: Internal Medicine

## 2013-11-03 NOTE — Telephone Encounter (Signed)
Jakesa call from the [facility] and is requesting a rx for pregabalin (LYRICA) 50 MG capsule to be fax to there pharmacy [facility]  [phone]

## 2013-11-03 NOTE — Telephone Encounter (Signed)
He is on a renal diet

## 2013-11-04 ENCOUNTER — Telehealth: Payer: Self-pay | Admitting: Internal Medicine

## 2013-11-04 MED ORDER — PREGABALIN 50 MG PO CAPS: 100.0000 mg | ORAL_CAPSULE | Freq: Every day | ORAL | Status: DC

## 2013-11-04 NOTE — Telephone Encounter (Signed)
rx faxed to pharmacy

## 2013-11-04 NOTE — Telephone Encounter (Signed)
[facility] is stating pt needs a refill on pregabalin (LYRICA) 50 MG capsule.  [facility]  Pharmacy needs a hard copy faxed to them at [phone]

## 2013-11-04 NOTE — Telephone Encounter (Signed)
Diet form faxed to [facility]

## 2013-11-11 ENCOUNTER — Telehealth: Payer: Self-pay | Admitting: *Deleted

## 2013-11-11 DIAGNOSIS — D473 Essential (hemorrhagic) thrombocythemia: Secondary | ICD-10-CM

## 2013-11-11 MED ORDER — ANAGRELIDE HCL 0.5 MG PO CAPS: ORAL_CAPSULE | ORAL | Status: DC

## 2013-11-11 NOTE — Telephone Encounter (Signed)
Lab work 11/05/13 received from [facility] showed plt count 638.  Per Dr Vista Mink, pt needs to increase his anagrelide from 1.0 to 1.5mg  daily.  Called and informed pt's daughter.  She verbalized understanding.  Order faxed over to [facility] per pt's daughter request.  SLJ

## 2013-11-13 ENCOUNTER — Emergency Department (HOSPITAL_COMMUNITY)
Admission: EM | Admit: 2013-11-13 | Discharge: 2013-11-14 | Disposition: A | Discharge status: Home / Self Care | Payer: Medicare Other | Attending: Emergency Medicine | Admitting: Emergency Medicine

## 2013-11-13 ENCOUNTER — Encounter (HOSPITAL_COMMUNITY): Payer: Self-pay | Admitting: Emergency Medicine

## 2013-11-13 DIAGNOSIS — Z87891 Personal history of nicotine dependence: Secondary | ICD-10-CM | POA: Insufficient documentation

## 2013-11-13 DIAGNOSIS — Z79899 Other long term (current) drug therapy: Secondary | ICD-10-CM | POA: Insufficient documentation

## 2013-11-13 DIAGNOSIS — Y841 Kidney dialysis as the cause of abnormal reaction of the patient, or of later complication, without mention of misadventure at the time of the procedure: Secondary | ICD-10-CM | POA: Insufficient documentation

## 2013-11-13 DIAGNOSIS — Z7902 Long term (current) use of antithrombotics/antiplatelets: Secondary | ICD-10-CM | POA: Insufficient documentation

## 2013-11-13 DIAGNOSIS — D72829 Elevated white blood cell count, unspecified: Secondary | ICD-10-CM | POA: Diagnosis not present

## 2013-11-13 DIAGNOSIS — Z872 Personal history of diseases of the skin and subcutaneous tissue: Secondary | ICD-10-CM | POA: Insufficient documentation

## 2013-11-13 DIAGNOSIS — D649 Anemia, unspecified: Secondary | ICD-10-CM | POA: Diagnosis not present

## 2013-11-13 DIAGNOSIS — D45 Polycythemia vera: Secondary | ICD-10-CM | POA: Insufficient documentation

## 2013-11-13 DIAGNOSIS — I2789 Other specified pulmonary heart diseases: Secondary | ICD-10-CM | POA: Diagnosis not present

## 2013-11-13 DIAGNOSIS — N186 End stage renal disease: Secondary | ICD-10-CM | POA: Diagnosis not present

## 2013-11-13 DIAGNOSIS — M129 Arthropathy, unspecified: Secondary | ICD-10-CM | POA: Insufficient documentation

## 2013-11-13 DIAGNOSIS — D751 Secondary polycythemia: Secondary | ICD-10-CM

## 2013-11-13 DIAGNOSIS — Z8719 Personal history of other diseases of the digestive system: Secondary | ICD-10-CM | POA: Diagnosis not present

## 2013-11-13 DIAGNOSIS — D696 Thrombocytopenia, unspecified: Secondary | ICD-10-CM | POA: Insufficient documentation

## 2013-11-13 DIAGNOSIS — T82598A Other mechanical complication of other cardiac and vascular devices and implants, initial encounter: Secondary | ICD-10-CM | POA: Diagnosis present

## 2013-11-13 DIAGNOSIS — I129 Hypertensive chronic kidney disease with stage 1 through stage 4 chronic kidney disease, or unspecified chronic kidney disease: Secondary | ICD-10-CM | POA: Insufficient documentation

## 2013-11-13 DIAGNOSIS — T82838A Hemorrhage of vascular prosthetic devices, implants and grafts, initial encounter: Secondary | ICD-10-CM

## 2013-11-13 LAB — I-STAT CHEM 8, ED
BUN: 33 mg/dL — AB (ref 6–23)
CALCIUM ION: 1.01 mmol/L — AB (ref 1.13–1.30)
CHLORIDE: 94 meq/L — AB (ref 96–112)
Creatinine, Ser: 3.1 mg/dL — ABNORMAL HIGH (ref 0.50–1.35)
Glucose, Bld: 150 mg/dL — ABNORMAL HIGH (ref 70–99)
HCT: 45 % (ref 39.0–52.0)
Hemoglobin: 15.3 g/dL (ref 13.0–17.0)
Potassium: 4.9 mEq/L (ref 3.7–5.3)
SODIUM: 137 meq/L (ref 137–147)
TCO2: 31 mmol/L (ref 0–100)

## 2013-11-13 LAB — CBC
HCT: 42.1 % (ref 39.0–52.0)
Hemoglobin: 13 g/dL (ref 13.0–17.0)
MCH: 29.1 pg (ref 26.0–34.0)
MCHC: 30.9 g/dL (ref 30.0–36.0)
MCV: 94.4 fL (ref 78.0–100.0)
Platelets: 1036 10*3/uL (ref 150–400)
RBC: 4.46 MIL/uL (ref 4.22–5.81)
RDW: 16.8 % — ABNORMAL HIGH (ref 11.5–15.5)
WBC: 20.7 10*3/uL — ABNORMAL HIGH (ref 4.0–10.5)

## 2013-11-13 NOTE — ED Notes (Signed)
Pt. arrived with EMS from an assisted living facility  , hemodialysis today , he removed the dressing at his graft and started bleeding , pressure dressing applied prior to arrival / no bleeding at this time .

## 2013-11-13 NOTE — ED Notes (Signed)
RN spoke to family, they were upset because of wait. Explained ESI process, and that patient was currently stable. RN provided comfort measures, initiated verbal orders from Dr. Thurnell Garbe.

## 2013-11-14 LAB — PATHOLOGIST SMEAR REVIEW

## 2013-11-14 NOTE — Discharge Instructions (Signed)
From todays lab tests, your platelet count is very high 1035, and wbc high 20.  We discuss these values with the hematologist, who indicates for you to continue the anagrelide, and follow up with your hem/onc doctor in coming week - call office tomorrow to arrange follow up. If bleeding recurs, hold direct pressure on the exact bleeding spot with finger tip and return for recheck. Return to ER if worse, recurrent bleeding, new symptoms, other concern.

## 2013-11-14 NOTE — ED Provider Notes (Signed)
CSN: [id number]     Arrival date & time 11/13/13  2056 History   First MD Initiated Contact with Patient 11/14/13 0024     Chief Complaint  Patient presents with  . Vascular Access Problem     (Consider location/radiation/quality/duration/timing/severity/associated sxs/prior Treatment) The history is provided by the patient.  pt s/p normal dialyses today, states after d/c hd cath, had bleeding from site-  Resolved w direct pressure.  No current bleeding. No other abn bleeding or bruising. States had normal dialyses today. No recent change in meds. No oral anticoag use. No sob. No faintness.      Past Medical History  Diagnosis Date  . Pulmonary hypertension   . Atrial fibrillation   . Thrombocytopenia   . Anemia   . Arthritis   . Diverticulitis   . Hypertension   . Renal insufficiency   . Skin lesion of back    Past Surgical History  Procedure Laterality Date  . Ruptured colon  1980  . S/p colectomy  1980  . Tonsillectomy    . Av fistula placement  2002    left arm   Family History  Problem Relation Age of Onset  . Heart disease Father   . Hypertension Father   . Heart disease Mother    History  Substance Use Topics  . Smoking status: Former Smoker -- 0.50 packs/day for 40 years    Types: Cigarettes    Quit date: 04/11/1979  . Smokeless tobacco: Never Used  . Alcohol Use: Yes     Comment: once per week    Review of Systems  Constitutional: Negative for fever and chills.  HENT: Negative for nosebleeds.   Respiratory: Negative for shortness of breath.   Gastrointestinal: Negative for blood in stool.  Neurological: Negative for syncope.      Allergies  Review of patient's allergies indicates no known allergies.  Home Medications   Prior to Admission medications   Medication Sig Start Date End Date Taking? Authorizing Provider  acetaminophen (TYLENOL) 500 MG tablet Take 500 mg by mouth every 6 (six) hours as needed.    Historical Provider, MD   anagrelide (AGRYLIN) 0.5 MG capsule Take 3 tablets (1.5mg ) oral daily 11/11/13   Curt Bears, MD  aspirin 81 MG tablet Take 81 mg by mouth daily.      Historical Provider, MD  calcium acetate (PHOSLO) 667 MG capsule Take 667 mg by mouth 3 (three) times daily with meals.     Historical Provider, MD  COLCRYS 0.6 MG tablet TAKE 1 TABLET BY MOUTH TWICE A DAY    Ricard Dillon, MD  diclofenac sodium (VOLTAREN) 1 % GEL Apply 2 g topically 4 (four) times daily. 04/21/13   Ricard Dillon, MD  febuxostat (ULORIC) 40 MG tablet Take 40 mg by mouth daily.     Historical Provider, MD  folic acid (FOLVITE) 1 MG tablet TAKE 1 TABLET BY MOUTH TWICE DAILY 05/21/13   Ricard Dillon, MD  lidocaine (LIDODERM) 5 % Place 3 patches onto the skin daily. Place in areas of greatest pain  Up to three patches a day  Remove & Discard patches within 12 hours 04/21/13   Ricard Dillon, MD  midodrine (PROAMATINE) 10 MG tablet Take 10 mg by mouth 3 (three) times daily.    Historical Provider, MD  omeprazole (PRILOSEC) 40 MG capsule Take 40 mg by mouth daily.    Historical Provider, MD  oxycodone (OXY-IR) 5 MG capsule Take 1 capsule (5  mg total) by mouth every 6 (six) hours as needed for pain. Tablet please 08/29/13   Ricard Dillon, MD  polyethylene glycol powder (GLYCOLAX/MIRALAX) powder Take 17 g by mouth 2 (two) times daily as needed. 01/27/13   Marletta Lor, MD  pregabalin (LYRICA) 50 MG capsule Take 2 capsules (100 mg total) by mouth at bedtime. 11/04/13   Ricard Dillon, MD  senna (SENOKOT) 8.6 MG tablet Take 1 tablet by mouth daily.    Historical Provider, MD  vitamin C (ASCORBIC ACID) 250 MG tablet Take 250 mg by mouth daily.    Historical Provider, MD   BP 106/60  Pulse 101  Temp(Src) 97.9 F (36.6 C) (Oral)  Resp 18  SpO2 93% Physical Exam  Nursing note and vitals reviewed. Constitutional: He is oriented to person, place, and time. He appears well-developed and well-nourished. No distress.  HENT:   Mouth/Throat: Oropharynx is clear and moist.  Eyes: Conjunctivae are normal.  Neck: Neck supple. No tracheal deviation present.  Cardiovascular: Normal rate.   Pulmonary/Chest: Effort normal. No accessory muscle usage. No respiratory distress.  Musculoskeletal: Normal range of motion.  Left upper arm hd graft.  No active bleeding. No large hematoma noted. Distal pulse palp.   Neurological: He is alert and oriented to person, place, and time.  Skin: Skin is warm and dry. He is not diaphoretic.  Psychiatric: He has a normal mood and affect.    ED Course  Procedures (including critical care time) Labs Review  Results for orders placed during the hospital encounter of 11/13/13  CBC      Result Value Ref Range   WBC 20.7 (*) 4.0 - 10.5 K/uL   RBC 4.46  4.22 - 5.81 MIL/uL   Hemoglobin 13.0  13.0 - 17.0 g/dL   HCT 42.1  39.0 - 52.0 %   MCV 94.4  78.0 - 100.0 fL   MCH 29.1  26.0 - 34.0 pg   MCHC 30.9  30.0 - 36.0 g/dL   RDW 16.8 (*) 11.5 - 15.5 %   Platelets 1036 (*) 150 - 400 K/uL  I-STAT CHEM 8, ED      Result Value Ref Range   Sodium 137  137 - 147 mEq/L   Potassium 4.9  3.7 - 5.3 mEq/L   Chloride 94 (*) 96 - 112 mEq/L   BUN 33 (*) 6 - 23 mg/dL   Creatinine, Ser 3.10 (*) 0.50 - 1.35 mg/dL   Glucose, Bld 150 (*) 70 - 99 mg/dL   Calcium, Ion 1.01 (*) 1.13 - 1.30 mmol/L   TCO2 31  0 - 100 mmol/L   Hemoglobin 15.3  13.0 - 17.0 g/dL   HCT 45.0  39.0 - 52.0 %       MDM   Dressing held.   Recheck after pt in ED a few hrs, no bleeding or accumulation of hematoma.   Pt appears stable for d/c.   Reviewed charts, pt seeing heme onc w primary polycythemia, had recent increase in anagrelide from 1 to 1.5, prior plt count 650K.  Discussed pt, prior and current labs w Dr Alen Blew, on call for heme/onc, he states make no change in anagrelide dose now, to have pt f/u in office in coming week.   Recheck no bleeding. Pt stable for d/c.     Mirna Mires, MD 11/14/13 [phone]

## 2013-11-21 ENCOUNTER — Telehealth: Payer: Self-pay | Admitting: Medical Oncology

## 2013-11-21 ENCOUNTER — Other Ambulatory Visit: Payer: Self-pay | Admitting: Medical Oncology

## 2013-11-21 NOTE — Telephone Encounter (Signed)
I spoke with the patient about this. To daughter and told her Ryan Arellano will review July and aug labs and determine if he needs to see him sooner than October.

## 2013-11-25 ENCOUNTER — Other Ambulatory Visit: Payer: Self-pay | Admitting: *Deleted

## 2013-11-25 ENCOUNTER — Other Ambulatory Visit: Payer: Self-pay | Admitting: Medical Oncology

## 2013-11-27 ENCOUNTER — Telehealth: Payer: Self-pay | Admitting: Internal Medicine

## 2013-11-27 ENCOUNTER — Telehealth: Payer: Self-pay | Admitting: Medical Oncology

## 2013-11-27 NOTE — Telephone Encounter (Signed)
Spoke to Ryan Arellano and told her to cancel aug appts . Dr Julien Nordmann wants to review aug labs before deciding about changing oct appt.

## 2013-11-27 NOTE — Telephone Encounter (Signed)
Spk w/daughter confirmed labs/ov per 08/14 POF, mailed sch...KJ

## 2013-12-05 ENCOUNTER — Ambulatory Visit: Payer: Medicare Other | Admitting: Physician Assistant

## 2013-12-05 ENCOUNTER — Other Ambulatory Visit: Payer: Medicare Other

## 2013-12-31 ENCOUNTER — Encounter: Payer: Self-pay | Admitting: Family Medicine

## 2013-12-31 ENCOUNTER — Other Ambulatory Visit: Payer: Self-pay | Admitting: *Deleted

## 2013-12-31 ENCOUNTER — Ambulatory Visit (INDEPENDENT_AMBULATORY_CARE_PROVIDER_SITE_OTHER): Payer: Medicare Other | Admitting: Family Medicine

## 2013-12-31 VITALS — BP 124/64 | HR 76 | Wt 128.0 lb

## 2013-12-31 DIAGNOSIS — G5631 Lesion of radial nerve, right upper limb: Secondary | ICD-10-CM

## 2013-12-31 DIAGNOSIS — Z23 Encounter for immunization: Secondary | ICD-10-CM

## 2013-12-31 DIAGNOSIS — L853 Xerosis cutis: Secondary | ICD-10-CM

## 2013-12-31 DIAGNOSIS — L258 Unspecified contact dermatitis due to other agents: Secondary | ICD-10-CM

## 2013-12-31 DIAGNOSIS — G5630 Lesion of radial nerve, unspecified upper limb: Secondary | ICD-10-CM

## 2013-12-31 MED ORDER — PREGABALIN 50 MG PO CAPS: 100.0000 mg | ORAL_CAPSULE | Freq: Every day | ORAL | Status: DC

## 2013-12-31 NOTE — Patient Instructions (Signed)
Radial Nerve Palsy Wrist drop is also known as radial nerve palsy. It is a condition in which you can not extend your wrist. This means if you are standing with your elbow bent at a right angle and with the top of your hand pointed at the ceiling, you can not hold your hand up. It falls toward the floor.  This action of extending your wrist is caused by the muscles in the back of your arm. These muscles are controlled by the radial nerve. This means that anything affecting the radial nerve so it can not tell the muscles how to work will cause wrist drop. This is medically called radial nerve palsy. Also the radial nerve is a motor and sensory nerve so anything affecting it causes problems with movement and feeling. CAUSES  Some more common causes of wrist drop are:  A break (fracture) of the large bone in the arm between your shoulder and your elbow (humerus). This is because the radial nerve winds around the humerus.  Improper use of crutches causes this because the radial nerve runs through the armpit (axilla). Crutches which are too long can put pressure on the nerve. This is sometimes called crutch palsy.  Falling asleep with your arm over a chair and supported on the back is a common cause. This is sometimes called Saturday Night Syndrome.  Wrist drop can be associated with lead poisoning because of the effect of lead on the radial nerve. SYMPTOMS  The wrist drop is an obvious problem, but there may also be numbness in the back of the arm, forearm or hand which provides feeling in these areas by the radial nerve. There can be difficulty straightening out the elbow in addition to the wrist. There may be numbness, tingling, pain, burning sensations or other abnormal feelings. Symptoms depend entirely on where the radial nerve is injured. DIAGNOSIS   Wrist drop is obvious just by looking at it. Your caregiver may make the diagnosis by taking your history and doing a couple tests.  One test which  may be done is a nerve conduction study. This test shows if the radial nerve is conducting signals well. If not, it can determine where the nerve problem is.  Sometimes X-ray studies are done. Your caregiver will determine if further testing needs to be done. TREATMENT   Usually if the problem is found to be pressure on the nerve, simply removing the pressure will allow the nerve to go back to normal in a few weeks to a few months. Other treatments will depend upon the cause found.  Only take over-the-counter or prescription medicines for pain, discomfort, or fever as directed by your caregiver.  Sometimes seizure medications are used.  Steroids are sometimes given to decrease swelling if it is thought to be a possible cause. Document Released: 12/01/2005 Document Revised: 06/19/2011 Document Reviewed: 06/11/2013 Exit[employer] Patient Information 2015 [employer]. This information is not intended to replace advice given to you by your health care provider. Make sure you discuss any questions you have with your health care provider.  

## 2013-12-31 NOTE — Progress Notes (Signed)
   Subjective:    Patient ID: Ryan Arellano, male    DOB: 05/13/1921, 80 y.o.   MRN: [id number]  HPI Acute problem of right wrist weakness. Noted this morning. He's also had some tingling and possibly decreased sensation dorsum of the hand. He has good grip strength. Did not notice any speech changes or any focal weakness otherwise. He is ambulating with walker as usual. They did not notice any proximal right upper extremity weakness. Patient does not recall any history of injury to the right upper extremity. Denies any cervical neck pain. He does tend to sleep frequently in a recliner and perhaps on his back position.  Frequent itching and scratching of the lower extremities. He has history of cellulitis lower extremities. No recent fever. He has constant itching of lower extremities.  Past Medical History  Diagnosis Date  . Pulmonary hypertension   . Atrial fibrillation   . Thrombocytopenia   . Anemia   . Arthritis   . Diverticulitis   . Hypertension   . Renal insufficiency   . Skin lesion of back    Past Surgical History  Procedure Laterality Date  . Ruptured colon  1980  . S/p colectomy  1980  . Tonsillectomy    . Av fistula placement  2002    left arm    reports that he quit smoking about 34 years ago. His smoking use included Cigarettes. He has a 20 pack-year smoking history. He has never used smokeless tobacco. He reports that he drinks alcohol. He reports that he does not use illicit drugs. family history includes Heart disease in his father and mother; Hypertension in his father. Allergies  Allergen Reactions  . Shellfish Allergy Other (See Comments)    gout      Review of Systems  Constitutional: Negative for fever and chills.  HENT: Negative for trouble swallowing.   Eyes: Negative for visual disturbance.  Respiratory: Negative for shortness of breath.   Cardiovascular: Negative for chest pain.  Musculoskeletal: Negative for back pain.  Neurological:  Positive for weakness and numbness.       Objective:   Physical Exam  Constitutional: He is oriented to person, place, and time. He appears well-developed and well-nourished. No distress.  Cardiovascular: Normal rate.   Pulmonary/Chest: Effort normal and breath sounds normal. No respiratory distress. He has no wheezes. He has no rales.  Neurological: He is alert and oriented to person, place, and time. He has normal reflexes. No cranial nerve deficit.  Patient has right wrist drop. He has inability to extend the fingers. He has sensory impairment dorsum of the right hand and dorsum distal forearm. Good grip strength. He does not have any proximal right upper extremity weakness with forearm in flexion or extension. No evidence for weakness lower extremities.          Assessment & Plan:  #1 right radial nerve palsy. No history of reported injury. He denies any humerus tenderness or signs of injury. ?related to sleeping position.   Avoid pressure on radial nerve. Right wrist splint for neutral position. Reassess 2 weeks. Consider neurology referral if not improving at that point.  #2 Bilateral leg pruritus and dry skin. Multiple scratches. Try moisturizing lotion twice daily.  Avoid topical steroids at this time with several open sores.  No secondary infection.

## 2013-12-31 NOTE — Progress Notes (Signed)
Pre visit review using our clinic review tool, if applicable. No additional management support is needed unless otherwise documented below in the visit note. 

## 2014-01-05 ENCOUNTER — Telehealth: Payer: Self-pay | Admitting: Internal Medicine

## 2014-01-05 NOTE — Telephone Encounter (Signed)
Signed and faxed

## 2014-01-05 NOTE — Telephone Encounter (Signed)
Pt has an open would on left buttocks. They need someone to sign home health order to treat this. They faxed on 9/23. No resposne.  Pt has appt w/ dr hunter on 10/16. Can you sign this order?

## 2014-01-08 ENCOUNTER — Telehealth: Payer: Self-pay | Admitting: *Deleted

## 2014-01-08 NOTE — Telephone Encounter (Signed)
Pt's daughter called wanting to know if lab work had been sent to Dr Vista Mink.  Informed her that it does not appear we have received it yet.  She will call back Monday, 01/12/14 and if they still haven't been delivered she is requesting that we call the facility to see if they can fax them.

## 2014-01-09 ENCOUNTER — Telehealth: Payer: Self-pay | Admitting: Internal Medicine

## 2014-01-09 NOTE — Telephone Encounter (Signed)
FYI

## 2014-01-09 NOTE — Telephone Encounter (Signed)
Pt has start with home health service for pressure ulcer on left inner buttock. Nursing will see pt twice a wk until heal.

## 2014-01-12 ENCOUNTER — Telehealth: Payer: Self-pay | Admitting: *Deleted

## 2014-01-12 ENCOUNTER — Telehealth: Payer: Self-pay | Admitting: Medical Oncology

## 2014-01-12 NOTE — Telephone Encounter (Signed)
Labs dated 01/01/14 from [facility] shows Plt 441.  Per dr Vista Mink, continue same dose of anagrelide 1.5mg  daily.  Spoke to pt's daughter, Ms. Rosic, she verbalized understanding.

## 2014-01-12 NOTE — Telephone Encounter (Signed)
I called and requested labs from sept and October .

## 2014-01-14 ENCOUNTER — Ambulatory Visit (INDEPENDENT_AMBULATORY_CARE_PROVIDER_SITE_OTHER): Payer: Medicare Other | Admitting: Family Medicine

## 2014-01-14 ENCOUNTER — Encounter: Payer: Self-pay | Admitting: Family Medicine

## 2014-01-14 VITALS — BP 120/60 | HR 89 | Wt 128.0 lb

## 2014-01-14 DIAGNOSIS — G5631 Lesion of radial nerve, right upper limb: Secondary | ICD-10-CM

## 2014-01-14 NOTE — Patient Instructions (Signed)
May leave off wrist splint at this time Follow up for any recurrent wrist weakness Your radial nerve palsy is getting better.

## 2014-01-14 NOTE — Progress Notes (Signed)
Pre visit review using our clinic review tool, if applicable. No additional management support is needed unless otherwise documented below in the visit note. 

## 2014-01-14 NOTE — Progress Notes (Signed)
   Subjective:    Patient ID: Ryan Arellano, male    DOB: [birth year], 80 y.o.   MRN: [id number]  HPI Patient here for followup of right radial nerve palsy. Refer to prior note  "Acute problem of right wrist weakness. Noted this morning. He's also had some tingling and possibly decreased sensation dorsum of the hand. He has good grip strength. Did not notice any speech changes or any focal weakness otherwise. He is ambulating with walker as usual. They did not notice any proximal right upper extremity weakness. Patient does not recall any history of injury to the right upper extremity. Denies any cervical neck pain. He does tend to sleep frequently in a recliner and perhaps on his back position."  No recent crutches to use. No history of injury. We applied wrist splint to keep neutrality with wrist positioning. Daughter states that his symptoms are resolving. He has good strength in the wrist. No neck pain. No numbness.  Past Medical History  Diagnosis Date  . Pulmonary hypertension   . Atrial fibrillation   . Thrombocytopenia   . Anemia   . Arthritis   . Diverticulitis   . Hypertension   . Renal insufficiency   . Skin lesion of back    Past Surgical History  Procedure Laterality Date  . Ruptured colon  1980  . S/p colectomy  1980  . Tonsillectomy    . Av fistula placement  2002    left arm    reports that he quit smoking about 34 years ago. His smoking use included Cigarettes. He has a 20 pack-year smoking history. He has never used smokeless tobacco. He reports that he drinks alcohol. He reports that he does not use illicit drugs. family history includes Heart disease in his father and mother; Hypertension in his father. Allergies  Allergen Reactions  . Shellfish Allergy Other (See Comments)    gout      Review of Systems  Cardiovascular: Negative for chest pain.  Neurological: Negative for weakness and numbness.       Objective:   Physical Exam  Constitutional: He  appears well-developed and well-nourished.  Cardiovascular: Normal rate and regular rhythm.   Neurological:  Right wrist reveals full range of motion with extension and flexion. He has only mild weakness with wrist extension. Normal sensory function          Assessment & Plan:  Right radial nerve palsy. Resolving. Leave wrist splint off at this time.  Followup for any signs of recurrence

## 2014-01-23 ENCOUNTER — Ambulatory Visit (INDEPENDENT_AMBULATORY_CARE_PROVIDER_SITE_OTHER): Payer: Medicare Other | Admitting: Family Medicine

## 2014-01-23 ENCOUNTER — Encounter: Payer: Self-pay | Admitting: Family Medicine

## 2014-01-23 VITALS — BP 112/70 | Temp 98.0°F | Wt 128.0 lb

## 2014-01-23 DIAGNOSIS — I480 Paroxysmal atrial fibrillation: Secondary | ICD-10-CM

## 2014-01-23 DIAGNOSIS — N186 End stage renal disease: Secondary | ICD-10-CM

## 2014-01-23 DIAGNOSIS — IMO0002 Reserved for concepts with insufficient information to code with codable children: Secondary | ICD-10-CM | POA: Insufficient documentation

## 2014-01-23 DIAGNOSIS — I953 Hypotension of hemodialysis: Secondary | ICD-10-CM

## 2014-01-23 DIAGNOSIS — K219 Gastro-esophageal reflux disease without esophagitis: Secondary | ICD-10-CM | POA: Insufficient documentation

## 2014-01-23 NOTE — Assessment & Plan Note (Signed)
Well controlled on dialysis. Continue tu/th/friday and [facility] on [address].

## 2014-01-23 NOTE — Progress Notes (Signed)
Ryan Reddish, MD Phone: [phone]  Subjective:  Patient presents today to establish care with me as their new primary care provider. Patient was formerly a patient of Dr. Arnoldo Morale. Chief complaint-noted.   Hypertension/hypotension-well controlled BP Readings from Last 3 Encounters:  01/23/14 112/70  01/14/14 120/60  12/31/13 124/64  No hypertensive's now on dialysis. Takes midodrine.  ROS- no lightheadedness  ESRD-controlled through dialysis Dr. Jamal Maes [facility]. 2014 started dialysis. [facility] on [address]. Tu/Th/Sat.  L AVF Calcium acetate (phoslo) ROS-no confusion  Atrial fibrillation-controlled.  Rate controlled without medication. Continue aspirin alone ROS-no palpitations, chest pain or shortness of breath  The following were reviewed and entered/updated in epic: Past Medical History  Diagnosis Date  . Pulmonary hypertension   . Atrial fibrillation   . Thrombocytopenia   . Anemia   . Arthritis   . Diverticulitis   . Hypertension   . Renal insufficiency   . Skin lesion of back   . DIVERTICULITIS, HX OF 02/21/2010         Patient Active Problem List   Diagnosis Date Noted  . End stage renal disease 03/07/2010    Priority: High  . Hypotension 02/21/2010    Priority: High  . Atrial fibrillation 02/21/2010    Priority: High  . Gout 03/22/2010    Priority: Medium  . Postherpetic neuralgia 02/21/2010    Priority: Medium  . THROMBOCYTHEMIA 02/21/2010    Priority: Medium  . GERD (gastroesophageal reflux disease) 01/23/2014    Priority: Low  . Compression fracture 01/23/2014    Priority: Low  . Pressure ulcer of coccygeal region 08/14/2011    Priority: Low  . Pulmonary hypertension 02/21/2010    Priority: Low   Past Surgical History  Procedure Laterality Date  . Ruptured colon  1980  . S/p colectomy  1980  . Tonsillectomy    . Av fistula placement  2002    left arm    Family History  Problem Relation Age of Onset   . Heart disease Father   . Hypertension Father   . Heart disease Mother     Medications- reviewed and updated Current Outpatient Prescriptions  Medication Sig Dispense Refill  . anagrelide (AGRYLIN) 0.5 MG capsule Take 3 tablets (1.5mg ) oral daily  120 capsule  1  . aspirin 81 MG tablet Take 81 mg by mouth daily.        . B Complex-C-Folic Acid (DIALYVITE PO) Take 1 tablet by mouth daily.      . calcium acetate (PHOSLO) 667 MG capsule Take 667 mg by mouth 4 (four) times daily -  with meals and at bedtime.       . colchicine 0.6 MG tablet Take 0.6 mg by mouth 2 (two) times daily. For gout      . febuxostat (ULORIC) 40 MG tablet Take 40 mg by mouth daily.       . midodrine (PROAMATINE) 10 MG tablet Take 10 mg by mouth 3 (three) times daily.      Marland Kitchen omeprazole (PRILOSEC) 40 MG capsule Take 40 mg by mouth daily.      . pregabalin (LYRICA) 50 MG capsule Take 2 capsules (100 mg total) by mouth at bedtime.  60 capsule  5  . vitamin C (ASCORBIC ACID) 250 MG tablet Take 250 mg by mouth daily.      . diclofenac sodium (VOLTAREN) 1 % GEL Apply 2 g topically 4 (four) times daily.  100 g  11  . lidocaine (LIDODERM) 5 %  Place 3 patches onto the skin daily. Place in areas of greatest pain  Up to three patches a day  Remove & Discard patches within 12 hours  90 patch  3  . neomycin-bacitracin-polymyxin (NEOSPORIN) ointment Apply 1 application topically every 12 (twelve) hours. Apply to spot on back twice daily and keep covered.      Marland Kitchen oxycodone (OXY-IR) 5 MG capsule Take 1/2 tab in AM as needed for pain      . Sennosides (SENOKOT PO) Take by mouth. Take 1 tablet daily as needed       No current facility-administered medications for this visit.    Allergies-reviewed and updated Allergies  Allergen Reactions  . Shellfish Allergy Other (See Comments)    gout    History   Social History  . Marital Status: Widowed    Spouse Name: N/A    Number of Children: N/A  . Years of Education: N/A    Occupational History  . retired    Social History Main Topics  . Smoking status: Former Smoker -- 0.50 packs/day for 40 years    Types: Cigarettes    Quit date: 04/11/1979  . Smokeless tobacco: Never Used  . Alcohol Use: Yes     Comment: once per week  . Drug Use: No  . Sexual Activity: No   Other Topics Concern  . None   Social History Narrative   Widower 2009. 3 kids. 6 grandkids. 5 greatgrandkids.    Lives at [facility]. They provide food, meds, cleaning, washing.    Patient bathes hisself, clothes yourself,    Daughter handles finances. 2 daughters have 56 and financial.       Retired from Primary school teacher: gambling-used to like to go to casino. Tv, newsspaper    ROS--See HPI   Objective: BP 112/70  Temp(Src) 98 F (36.7 C)  Wt 128 lb (58.06 kg) Gen: NAD, resting comfortably in chair, appears stated age CV: irregularly irregular no murmurs rubs or gallops Lungs: CTAB no crackles, wheeze, rhonchi Abdomen: soft/nontender/nondistended/normal bowel sounds.  Ext: no edema, L AVF thrill felt Skin: warm, dry, no rash, some easy bruising Neuro: grossly normal, moves all extremities, PERRLA  Assessment/Plan:  Hypotension Formerly hypertensive. Now takes midodrine-well controlled on this.   End stage renal disease Well controlled on dialysis. Continue tu/th/friday and [facility] on [address].   Atrial fibrillation Well controlled rate wise. Aspirin alone given thrombocytopenia and fall risk-no coumadin.

## 2014-01-23 NOTE — Patient Instructions (Signed)
Wonderful to meet you! Thanks for letting me ask you so many questions!   Let's check in 3 months from now. Overall I think things look well. Glad you got your flu shot.

## 2014-01-23 NOTE — Assessment & Plan Note (Signed)
Formerly hypertensive. Now takes midodrine-well controlled on this.

## 2014-01-23 NOTE — Assessment & Plan Note (Signed)
Well controlled rate wise. Aspirin alone given thrombocytopenia and fall risk-no coumadin.

## 2014-01-26 ENCOUNTER — Telehealth: Payer: Self-pay | Admitting: Family Medicine

## 2014-01-26 MED ORDER — OXYCODONE HCL 5 MG PO CAPS: 5.0000 mg | ORAL_CAPSULE | ORAL | Status: DC | PRN

## 2014-01-26 NOTE — Telephone Encounter (Signed)
Pt request refill of the following: oxycodone (OXY-IR) 5 MG capsule    Phamacy: [facility]

## 2014-01-26 NOTE — Telephone Encounter (Signed)
Pt was just seen on 01/23/14. Did you not mean to refill this medication?

## 2014-01-26 NOTE — Telephone Encounter (Signed)
May refill. This was originally prescribed by pain management but Dr. Arnoldo Morale had assumed care of all pain management needs. Assume family will have to come pick up-please inform.

## 2014-01-26 NOTE — Telephone Encounter (Signed)
Medication refilled and faxed to [facility]

## 2014-01-28 ENCOUNTER — Encounter: Payer: Self-pay | Admitting: Internal Medicine

## 2014-01-28 ENCOUNTER — Ambulatory Visit (HOSPITAL_BASED_OUTPATIENT_CLINIC_OR_DEPARTMENT_OTHER): Payer: Medicare Other | Admitting: Internal Medicine

## 2014-01-28 ENCOUNTER — Telehealth: Payer: Self-pay | Admitting: Internal Medicine

## 2014-01-28 VITALS — BP 135/67 | HR 79 | Temp 97.7°F | Resp 19 | Ht 65.0 in | Wt 126.9 lb

## 2014-01-28 DIAGNOSIS — D473 Essential (hemorrhagic) thrombocythemia: Secondary | ICD-10-CM

## 2014-01-28 NOTE — Telephone Encounter (Signed)
Gave avs & cal for April 20.16. Pt said he has labs done through dialysis and are sent to Dr. Julien Nordmann.

## 2014-01-28 NOTE — Progress Notes (Signed)
[place] [phone]   Fax:([phone]) [place], [place] [address]  DIAGNOSIS:  1) Essential thrombocythemia  2) end-stage renal disease currently on hemodialysis on Tuesday, Thursday and Saturday  CURRENT THERAPY: Anagrelide 1.5 mg by mouth daily.  INTERVAL HISTORY: Ryan Arellano 80 y.o. male returns to the clinic today for followup visit accompanied his daughter. He denied having any significant bleeding issues. He is currently on anagrelide 1.5 mg by mouth daily and tolerating it fairly well. He has no nausea or vomiting, no fever or chills. The patient denied having any significant chest pain but continues to have shortness breath with exertion with no cough or hemoptysis. He has repeat CBC performed at the dialysis center few weeks ago and his platelets count is now up to 441,000 The patient is here today for evaluation and discussion of his lab results and recommendation regarding treatment of the essential thrombocythemia.  MEDICAL HISTORY: Past Medical History  Diagnosis Date  . Pulmonary hypertension   . Atrial fibrillation   . Thrombocytopenia   . Anemia   . Arthritis   . Diverticulitis   . Hypertension   . Renal insufficiency   . Skin lesion of back   . DIVERTICULITIS, HX OF 02/21/2010          ALLERGIES:  is allergic to shellfish allergy.  MEDICATIONS:  Current Outpatient Prescriptions  Medication Sig Dispense Refill  . anagrelide (AGRYLIN) 0.5 MG capsule Take 3 tablets (1.5mg ) oral daily  120 capsule  1  . aspirin 81 MG tablet Take 81 mg by mouth daily.        . B Complex-C-Folic Acid (DIALYVITE PO) Take 1 tablet by mouth daily.      . calcium acetate (PHOSLO) 667 MG capsule Take 667 mg by mouth 4 (four) times daily -  with meals and at bedtime.       . colchicine 0.6 MG tablet Take 0.6 mg by mouth 2 (two) times daily. For gout      . diclofenac sodium  (VOLTAREN) 1 % GEL Apply 2 g topically 4 (four) times daily.  100 g  11  . febuxostat (ULORIC) 40 MG tablet Take 40 mg by mouth daily.       Marland Kitchen lidocaine (LIDODERM) 5 % Place 3 patches onto the skin daily. Place in areas of greatest pain  Up to three patches a day  Remove & Discard patches within 12 hours  90 patch  3  . midodrine (PROAMATINE) 10 MG tablet Take 10 mg by mouth 3 (three) times daily.      Marland Kitchen neomycin-bacitracin-polymyxin (NEOSPORIN) ointment Apply 1 application topically every 12 (twelve) hours. Apply to spot on back twice daily and keep covered.      Marland Kitchen omeprazole (PRILOSEC) 40 MG capsule Take 40 mg by mouth daily.      Marland Kitchen oxycodone (OXY-IR) 5 MG capsule Take 1 capsule (5 mg total) by mouth every 4 (four) hours as needed. Take 1/2 tab in AM as needed for pain  30 capsule  0  . pregabalin (LYRICA) 50 MG capsule Take 2 capsules (100 mg total) by mouth at bedtime.  60 capsule  5  . Sennosides (SENOKOT PO) Take by mouth. Take 1 tablet daily as needed      . vitamin C (ASCORBIC ACID) 250 MG tablet Take 250 mg by mouth daily.       No current  facility-administered medications for this visit.    SURGICAL HISTORY:  Past Surgical History  Procedure Laterality Date  . Ruptured colon  1980  . S/p colectomy  1980  . Tonsillectomy    . Av fistula placement  2002    left arm    REVIEW OF SYSTEMS:  A comprehensive review of systems was negative except for: Constitutional: positive for fatigue   PHYSICAL EXAMINATION: General appearance: alert, cooperative, fatigued and no distress Head: Normocephalic, without obvious abnormality, atraumatic Neck: no adenopathy Lymph nodes: Cervical, supraclavicular, and axillary nodes normal. Resp: clear to auscultation bilaterally Cardio: regular rate and rhythm, S1, S2 normal, no murmur, click, rub or gallop GI: soft, non-tender; bowel sounds normal; no masses,  no organomegaly Extremities: extremities normal, atraumatic, no cyanosis or edema  ECOG  PERFORMANCE STATUS: 2 - Symptomatic, <50% confined to bed  Blood pressure 135/67, pulse 79, temperature 97.7 F (36.5 C), temperature source Oral, resp. rate 19, height 5\' 5"  (1.651 m), weight 126 lb 14.4 oz (57.561 kg), SpO2 100.00%.  LABORATORY DATA: Platelets count441,000  RADIOGRAPHIC STUDIES: No results found.  ASSESSMENT AND PLAN: This is a very pleasant 80 years old white male with history of essential thrombocythemia currently on anagrelide 1.5 mg by mouth daily and the last CBC performed at at the dialysis center showed platelets count of 441,000  I recommended for the patient to continue anagrelide 1.5 mg by mouth daily. He'll continue to have repeat CBC as scheduled at the dialysis center. I will see the patient back for followup visit in 6 months for reevaluation and further adjustment of his dose of anagrelide based on the platelets count.   He was advised to call immediately if he has any concerning symptoms or significant abnormality in his platelet count.  The patient voices understanding of current disease status and treatment options and is in agreement with the current care plan.  All questions were answered. The patient knows to call the clinic with any problems, questions or concerns. We can certainly see the patient much sooner if necessary.  Disclaimer: This note was dictated with voice recognition software. Similar sounding words can inadvertently be transcribed and may not be corrected upon review.

## 2014-01-29 ENCOUNTER — Ambulatory Visit: Payer: Medicare Other | Admitting: Internal Medicine

## 2014-02-10 ENCOUNTER — Telehealth: Payer: Self-pay | Admitting: Family Medicine

## 2014-02-10 NOTE — Telephone Encounter (Signed)
Pt is on a lot of pain med and daughter would like to have order fax to [facility] [phone] for her dad to have enema prn

## 2014-02-10 NOTE — Telephone Encounter (Signed)
Called and spoke with pt's daughter Butch Penny and she is aware of recommendations.  Per Butch Penny orders still need to be faxed to [facility].

## 2014-02-10 NOTE — Telephone Encounter (Signed)
I would prefer for him to use the as needed senokot first. If this does not work, could try some miralax perhaps 1/2 dose daily. Have [facility] fax me forms if senokot not effective.

## 2014-02-11 ENCOUNTER — Telehealth: Payer: Self-pay | Admitting: Family Medicine

## 2014-02-11 MED ORDER — PREGABALIN 50 MG PO CAPS: 100.0000 mg | ORAL_CAPSULE | Freq: Every day | ORAL | Status: DC

## 2014-02-11 NOTE — Telephone Encounter (Signed)
Per Dr. Yong Channel call [facility] and have pt's medications faxed over and a form for new medication.

## 2014-02-11 NOTE — Telephone Encounter (Signed)
Ryan Arellano from [facility] is calling they needs a rx lyrica 50 mg #180 for 3 month supply fax to [facility] [phone]

## 2014-02-11 NOTE — Telephone Encounter (Signed)
Called and spoke with [place] and she will fax over information.

## 2014-02-11 NOTE — Telephone Encounter (Signed)
Patient already had senna on orders as well as miralax. Asked for senna x 2 days until BM and if does not work miralax x 5 days. Clal [place] if never works.   [facility] needs to USE already available prn orders.

## 2014-02-11 NOTE — Telephone Encounter (Signed)
Medication refilled and faxed to [facility]

## 2014-02-12 NOTE — Telephone Encounter (Signed)
Per Dr. Yong Channel orders put in basked to be faxed.

## 2014-02-12 NOTE — Telephone Encounter (Signed)
Called [facility] and spoke with [place].She is aware of recommendations. Per Sallye Ober pt's constipation is over now and has had a "big blow out". Pls fax to: [phone].  Re-faxed information to Sallye Ober in the medication room at the fax above. Received confirmation of fax.

## 2014-02-25 ENCOUNTER — Telehealth: Payer: Self-pay | Admitting: Medical Oncology

## 2014-02-25 NOTE — Telephone Encounter (Signed)
I faxed last Office note from [place].

## 2014-02-27 ENCOUNTER — Telehealth: Payer: Self-pay | Admitting: Family Medicine

## 2014-02-27 NOTE — Telephone Encounter (Signed)
yes

## 2014-02-27 NOTE — Telephone Encounter (Signed)
Is this ok Dr. Hunter? 

## 2014-02-27 NOTE — Telephone Encounter (Signed)
Ryan Arellano needs new rx oxycodone fax hard copy to [phone]

## 2014-03-02 MED ORDER — OXYCODONE HCL 5 MG PO CAPS: 5.0000 mg | ORAL_CAPSULE | ORAL | Status: DC | PRN

## 2014-03-02 NOTE — Telephone Encounter (Signed)
Medication faxed to [facility]

## 2014-03-02 NOTE — Telephone Encounter (Signed)
Per pt daughter fax rx to [facility] [phone]

## 2014-03-12 ENCOUNTER — Telehealth: Payer: Self-pay | Admitting: Internal Medicine

## 2014-03-12 ENCOUNTER — Telehealth: Payer: Self-pay | Admitting: *Deleted

## 2014-03-12 ENCOUNTER — Telehealth: Payer: Self-pay | Admitting: Family Medicine

## 2014-03-12 NOTE — Telephone Encounter (Signed)
lvm for pt regarding to Jan appt...mailed pt appt sched/avs and letter °

## 2014-03-12 NOTE — Telephone Encounter (Signed)
Spoke w/ Med tech, Domingo Sep and advised of dr Psychologist, prison [facility] probation services. Marguita gone home today. Will cb tomorrow

## 2014-03-12 NOTE — Telephone Encounter (Signed)
FYI Dr. Hunter  

## 2014-03-12 NOTE — Telephone Encounter (Signed)
Ryan Arellano at [facility] states pt's leg is red, tender and warm to touch. They just noticed, and pt states it hurts.  They have been applying medicated lotion. Would like something they could put on it. Advised to send a fax, they will.

## 2014-03-12 NOTE — Telephone Encounter (Signed)
Cannot evaluate without seeing the rash. Advise office visit.

## 2014-03-12 NOTE — Telephone Encounter (Signed)
s.w. pt daughter and r/s appt to wed due to pt on dialysis on tue thur and sat

## 2014-03-12 NOTE — Telephone Encounter (Signed)
Received lab work from [facility].  Angie, Dr Sanda Klein RN called regarding these lab results stating that Dr Lorrene Reid wants Dr Vista Mink to see pt sooner for leukocytosis.  Pt f/u currently 07/29/14.  Per Dr Vista Mink, okay to move appt in the next 4 weeks.  Onc tx schedule filled out.  Called and informed pt's daughter Magda Paganini

## 2014-03-12 NOTE — Telephone Encounter (Signed)
Mrs. Ryan Arellano please see below and schedule pt for OV

## 2014-03-13 ENCOUNTER — Telehealth: Payer: Self-pay | Admitting: Family Medicine

## 2014-03-13 ENCOUNTER — Other Ambulatory Visit: Payer: Self-pay

## 2014-03-13 MED ORDER — PREGABALIN 50 MG PO CAPS: 100.0000 mg | ORAL_CAPSULE | Freq: Every day | ORAL | Status: DC

## 2014-03-13 NOTE — Telephone Encounter (Signed)
Pt request refill of the following: pregabalin (LYRICA) 50 MG capsule   Phamacy: [facility] at  [facility]

## 2014-03-13 NOTE — Telephone Encounter (Signed)
Is this ok to refill?  

## 2014-03-13 NOTE — Telephone Encounter (Signed)
yes

## 2014-03-13 NOTE — Telephone Encounter (Signed)
Medication faxed to [facility] [phone]

## 2014-03-23 ENCOUNTER — Other Ambulatory Visit: Payer: Self-pay

## 2014-03-23 ENCOUNTER — Telehealth: Payer: Self-pay | Admitting: Family Medicine

## 2014-03-23 MED ORDER — OXYCODONE HCL 5 MG PO TABS: ORAL_TABLET | ORAL | Status: DC

## 2014-03-23 MED ORDER — OXYCODONE HCL 5 MG PO TABA: ORAL_TABLET | ORAL | Status: DC

## 2014-03-23 NOTE — Telephone Encounter (Signed)
Dr. Yong Channel please clarify

## 2014-03-23 NOTE — Addendum Note (Signed)
Addended by: Townsend Roger D on: 03/23/2014 01:30 PM   Modules accepted: Orders

## 2014-03-23 NOTE — Telephone Encounter (Signed)
Rx with new clarification faxed to [facility]

## 2014-03-23 NOTE — Telephone Encounter (Signed)
[facility] following up on refill request for pt   Pt needs oxycodone (OXY-IR) 5 MG capsule  Demetria Pore w/ [facility] is confused about the dosage of this med for pt. She wasn't sure if pt needs the   oxycodone (OXY-IR) 5 MG capsule (Discontinued)    06/16/2013     Sig - Route: Take 2.5 mg by mouth 3 (three) times daily as needed.    they would like this rx faxed to [facility] [phone] They would like current info/directions for his oxycodone

## 2014-03-23 NOTE — Telephone Encounter (Signed)
Appears he is on scheduled 2.5mg  in the AM and then as needed 2.5 mg every 6 hours other times of the day.    I assumed this prescription from Dr. Arnoldo Morale and at first visit family had told me that rx was through pain management when in fact through [place]. If there are any needs beyond the above, just have patient scheduled for follow up so we can discuss in person pain management.

## 2014-03-27 ENCOUNTER — Ambulatory Visit (INDEPENDENT_AMBULATORY_CARE_PROVIDER_SITE_OTHER): Payer: Medicare Other | Admitting: Family Medicine

## 2014-03-27 ENCOUNTER — Encounter: Payer: Self-pay | Admitting: Family Medicine

## 2014-03-27 VITALS — BP 110/60 | HR 62 | Temp 98.3°F | Wt 128.0 lb

## 2014-03-27 DIAGNOSIS — L0390 Cellulitis, unspecified: Secondary | ICD-10-CM | POA: Insufficient documentation

## 2014-03-27 DIAGNOSIS — L03115 Cellulitis of right lower limb: Secondary | ICD-10-CM

## 2014-03-27 MED ORDER — DOXYCYCLINE HYCLATE 100 MG PO TABS: 100.0000 mg | ORAL_TABLET | Freq: Two times a day (BID) | ORAL | Status: DC

## 2014-03-27 NOTE — Assessment & Plan Note (Signed)
Cellulitis of right lower extremity which started after patient scratched open lesions. To treat the underlying dry skin I advised regular emollient use such as Vaseline. To treat the active cellulitis - treated with doxycycline. Patient will follow-up with me next week but I gave him strict return precautions and reasons to seek emergency room care. Patient seems to laugh about opening these lesions and scratching them and I reminded him that cellulitis can turn into a very serious illness quickly. He has several other healing sores on his left leg and upper back which do not require treatment and did not have active cellulitis

## 2014-03-27 NOTE — Patient Instructions (Signed)
Doxycycline for 10 days for cellulitis (twice a day dosing)  Return next week between Monday and Wednesday for repeat evaluation. Seek care sooner if worsens.   Treat dry skin with vaseline or other emmolient-worked in. Can use vaseline or antibiotic cream to cover lesions and then cover with bandage to help avoid scratching.   Elevated legs as much as possible to reduce swelling.

## 2014-03-27 NOTE — Progress Notes (Signed)
Garret Reddish, MD Phone: [phone]  Subjective:   Ryan Arellano is a 80 y.o. year old very pleasant male patient who presents with the following:  Right leg sores Patient has a history of cellulitis on bilateral lower extremities as he is a tendency to scratch his legs and open sores Which later become infected.   Apparently patient has been dealing with these lesions since early December and they continued to worsen. I was called on December 3 and asked patient to be brought in for evaluation. I received a fax today and aspirin to be evaluated and was finally scheduled.   Patient has 3 draining lesions the largest about a centimeter. They drain either purulent or serosanguineous fluid. He has surrounding erythema and warmth. He has mild pain. He is not tried anything for these. He continues to scratch of the lesion. Symptoms const and gradually worsening. His legs are very dry. He has similar lesions on his left leg and upper back that he scratches but they're not currently open and seems to be healing. They've tried antibiotic ointment but no other treatments. Sometimes dressings help patient to avoid scratching  He had similar lesions back in May which responded well to doxycycline  ROS-no fevers chills nausea or vomiting. Continues to display dialysis. Mild edema. Weight stable at 128.  Past Medical History- Patient Active Problem List   Diagnosis Date Noted  . End stage renal disease 03/07/2010    Priority: High  . Hypotension 02/21/2010    Priority: High  . Atrial fibrillation 02/21/2010    Priority: High  . Gout 03/22/2010    Priority: Medium  . Postherpetic neuralgia 02/21/2010    Priority: Medium  . THROMBOCYTHEMIA 02/21/2010    Priority: Medium  . GERD (gastroesophageal reflux disease) 01/23/2014    Priority: Low  . Compression fracture 01/23/2014    Priority: Low  . Pressure ulcer of coccygeal region 08/14/2011    Priority: Low  . Pulmonary hypertension  02/21/2010    Priority: Low  . Cellulitis 03/27/2014   Medications- reviewed and updated Current Outpatient Prescriptions  Medication Sig Dispense Refill  . anagrelide (AGRYLIN) 0.5 MG capsule Take 3 tablets (1.5mg ) oral daily 120 capsule 1  . aspirin 81 MG tablet Take 81 mg by mouth daily.      . B Complex-C-Folic Acid (DIALYVITE PO) Take 1 tablet by mouth daily.    . calcium acetate (PHOSLO) 667 MG capsule Take 667 mg by mouth 4 (four) times daily -  with meals and at bedtime.     . colchicine 0.6 MG tablet Take 0.6 mg by mouth 2 (two) times daily. For gout    . diclofenac sodium (VOLTAREN) 1 % GEL Apply 2 g topically 4 (four) times daily. 100 g 11  . febuxostat (ULORIC) 40 MG tablet Take 40 mg by mouth daily.     Marland Kitchen lidocaine (LIDODERM) 5 % Place 3 patches onto the skin daily. Place in areas of greatest pain  Up to three patches a day  Remove & Discard patches within 12 hours 90 patch 3  . midodrine (PROAMATINE) 10 MG tablet Take 10 mg by mouth 3 (three) times daily.    Marland Kitchen neomycin-bacitracin-polymyxin (NEOSPORIN) ointment Apply 1 application topically every 12 (twelve) hours. Apply to spot on back twice daily and keep covered.    Marland Kitchen omeprazole (PRILOSEC) 40 MG capsule Take 40 mg by mouth daily.    Marland Kitchen oxyCODONE (OXY IR/ROXICODONE) 5 MG immediate release tablet 1/2 tablet by mouth in  the AM and 1/2 tablet every 6 hours prn 90 tablet 0  . pregabalin (LYRICA) 50 MG capsule Take 2 capsules (100 mg total) by mouth at bedtime. 60 capsule 5  . Sennosides (SENOKOT PO) Take by mouth. Take 1 tablet daily as needed    . vitamin C (ASCORBIC ACID) 250 MG tablet Take 250 mg by mouth daily.     Objective: BP 110/60 mmHg  Pulse 62  Temp(Src) 98.3 F (36.8 C)  Wt 128 lb (58.06 kg) Gen: NAD, resting comfortably CV: irregularly irregular no murmurs rubs or gallops Lungs: CTAB no crackles, wheeze, rhonchi Ext: trace edema Skin: warm, dry 3 excoriations visible in picture with active drainage. There is  another lesoin on right leg not visible which has purulent material as well. Each of these is surounded by erythema and warmth and draining purulent material. There is one lesion on the right leg and 2 on the left that have dried and scaled over. Skin dry. Patient with 4 similar lesions on his back that have no purulence.  Neuro: grossly normal, moves all extremities      Assessment/Plan:  Cellulitis Cellulitis of right lower extremity which started after patient scratched open lesions. To treat the underlying dry skin I advised regular emollient use such as Vaseline. To treat the active cellulitis - treated with doxycycline. Patient will follow-up with me next week but I gave him strict return precautions and reasons to seek emergency room care. Patient seems to laugh about opening these lesions and scratching them and I reminded him that cellulitis can turn into a very serious illness quickly. He has several other healing sores on his left leg and upper back which do not require treatment and did not have active cellulitis   fortunately patient has stable vitals and seems to be doing well otherwise and appears appropriate for outpatient treatment.   Meds ordered this encounter  Medications  . doxycycline (VIBRA-TABS) 100 MG tablet    Sig: Take 1 tablet (100 mg total) by mouth 2 (two) times daily.    Dispense:  20 tablet    Refill:  0

## 2014-03-28 ENCOUNTER — Other Ambulatory Visit: Payer: Self-pay | Admitting: Nurse Practitioner

## 2014-04-01 ENCOUNTER — Encounter: Payer: Self-pay | Admitting: Family Medicine

## 2014-04-01 ENCOUNTER — Ambulatory Visit (INDEPENDENT_AMBULATORY_CARE_PROVIDER_SITE_OTHER): Payer: Medicare Other | Admitting: Family Medicine

## 2014-04-01 VITALS — BP 122/60 | HR 65 | Temp 97.4°F

## 2014-04-01 DIAGNOSIS — L03115 Cellulitis of right lower limb: Secondary | ICD-10-CM

## 2014-04-01 NOTE — Patient Instructions (Signed)
Finish course of doxycycline  If symptoms were to worsen, please return to care  If wounds close up, we might be able to use a low level steroid.   May increase oxycodone if we cannot keep patches on .

## 2014-04-01 NOTE — Assessment & Plan Note (Signed)
Much improved on recheck. Has 4 more days of doxycycline. Advise continued use of vaseline on dry itchy skin. If we can get open sores closed, may use intermittent triamcinolone to help with itch/scratch/sore cycle.

## 2014-04-01 NOTE — Progress Notes (Signed)
Garret Reddish, MD Phone: [phone]  Subjective:   Ryan Arellano is a 80 y.o. year old very pleasant male patient who presents with the following:  Right leg sores/Cellulitis follow up  Seen last week and diagnosed with cellulitis of right lower extremity around areas he had scratched/picked at previously.   Today, patient and family report that cellulitis has responded to doxycycline. He is on about day 5 at this point and erythema and warmth are much improved. The sites are no longer draining purulent material. Patient denies any pain. He has been using vasline and legs are much less dry. Seems to be scratching less as well.   ROS-no fevers chills nausea or vomiting. Mild edema.   Past Medical History- Patient Active Problem List   Diagnosis Date Noted  . End stage renal disease 03/07/2010    Priority: High  . Hypotension 02/21/2010    Priority: High  . Atrial fibrillation 02/21/2010    Priority: High  . Gout 03/22/2010    Priority: Medium  . Postherpetic neuralgia 02/21/2010    Priority: Medium  . THROMBOCYTHEMIA 02/21/2010    Priority: Medium  . GERD (gastroesophageal reflux disease) 01/23/2014    Priority: Low  . Compression fracture 01/23/2014    Priority: Low  . Pressure ulcer of coccygeal region 08/14/2011    Priority: Low  . Pulmonary hypertension 02/21/2010    Priority: Low  . Cellulitis 03/27/2014   Medications-  Current Outpatient Prescriptions  Medication Sig Dispense Refill  . anagrelide (AGRYLIN) 0.5 MG capsule Take 3 tablets (1.5mg ) oral daily 120 capsule 1  . aspirin 81 MG tablet Take 81 mg by mouth daily.      . B Complex-C-Folic Acid (DIALYVITE PO) Take 1 tablet by mouth daily.    . calcium acetate (PHOSLO) 667 MG capsule Take 667 mg by mouth 4 (four) times daily -  with meals and at bedtime.     . colchicine 0.6 MG tablet Take 0.6 mg by mouth 2 (two) times daily. For gout    . diclofenac sodium (VOLTAREN) 1 % GEL Apply 2 g topically 4 (four)  times daily. 100 g 11  . doxycycline (VIBRA-TABS) 100 MG tablet Take 1 tablet (100 mg total) by mouth 2 (two) times daily. 20 tablet 0  . febuxostat (ULORIC) 40 MG tablet Take 40 mg by mouth daily.     Marland Kitchen lidocaine (LIDODERM) 5 % Place 3 patches onto the skin daily. Place in areas of greatest pain  Up to three patches a day  Remove & Discard patches within 12 hours 90 patch 3  . midodrine (PROAMATINE) 10 MG tablet Take 10 mg by mouth 3 (three) times daily.    Marland Kitchen neomycin-bacitracin-polymyxin (NEOSPORIN) ointment Apply 1 application topically every 12 (twelve) hours. Apply to spot on back twice daily and keep covered.    Marland Kitchen omeprazole (PRILOSEC) 40 MG capsule Take 40 mg by mouth daily.    . pregabalin (LYRICA) 50 MG capsule Take 2 capsules (100 mg total) by mouth at bedtime. 60 capsule 5  . Sennosides (SENOKOT PO) Take by mouth. Take 1 tablet daily as needed    . vitamin C (ASCORBIC ACID) 250 MG tablet Take 250 mg by mouth daily.    Marland Kitchen oxyCODONE (OXY IR/ROXICODONE) 5 MG immediate release tablet 1/2 tablet by mouth in the AM and 1/2 tablet every 6 hours prn (Patient not taking: Reported on 04/01/2014) 90 tablet 0   Objective: BP 122/60 mmHg  Pulse 65  Temp(Src) 97.4 F (  36.3 C)  Wt  Gen: NAD, resting comfortably, scratches back and head during visit CV: irregularly irregular no murmurs rubs or gallops Lungs: CTAB no crackles, wheeze, rhonchi Ext: 1+ edema Skin: warm, dry Each open wound that was previously draining with some purulent material is no longer draining purulent material. Surrounding tissue is no longer warm. Erythema has improved. Pictures below compare last week to this week. Left leg sores which were not draining but were open have scaled over and are smaller in size.   Sitting in wheelchair   Last week   toda   Assessment/Plan:  Cellulitis Much improved on recheck. Has 4 more days of doxycycline. Advise continued use of vaseline on dry itchy skin. If we can get open  sores closed, may use intermittent triamcinolone to help with itch/scratch/sore cycle.

## 2014-04-06 ENCOUNTER — Telehealth: Payer: Self-pay | Admitting: Family Medicine

## 2014-04-06 NOTE — Telephone Encounter (Signed)
Pt still has cellulitis pretty bad and would like appt Mon. Pt states dr hunter could put a medicated boot on pt to keep from scratching  . Only SD appt at 11am.  Is it ok to use?

## 2014-04-06 NOTE — Telephone Encounter (Signed)
Pt has been scheduled.  °

## 2014-04-06 NOTE — Telephone Encounter (Signed)
That will be fine. 

## 2014-04-07 ENCOUNTER — Telehealth: Payer: Self-pay | Admitting: *Deleted

## 2014-04-07 NOTE — Telephone Encounter (Signed)
Lab work from Nov 24th 2015 and Dec 03/27/14 given to Dr Vista Mink to review.  Nov plt count 471, WBC 17.25,  Dec Plt count 494, WBC 15.45.

## 2014-04-08 ENCOUNTER — Telehealth: Payer: Self-pay | Admitting: *Deleted

## 2014-04-08 ENCOUNTER — Encounter: Payer: Self-pay | Admitting: *Deleted

## 2014-04-08 DIAGNOSIS — D473 Essential (hemorrhagic) thrombocythemia: Secondary | ICD-10-CM

## 2014-04-08 NOTE — Telephone Encounter (Signed)
Per Dr Vista Mink, based on lab work from 03/27/14, pt is to increase dose of anagrelide to 2.0mg  daily.  Called and informed pt's daughter.  Order for new dose change of anagrelide faxed to [facility].

## 2014-04-09 ENCOUNTER — Telehealth: Payer: Self-pay

## 2014-04-09 NOTE — Telephone Encounter (Signed)
Received a fax from [facility] pt had an open sore on right side of buttocks and needing orders for [facility] to and elevate and treat sore.  Ok per Dr. Raliegh Ip- covering for Dr. Yong Channel.   Form faxed back to [facility].

## 2014-04-12 DIAGNOSIS — D689 Coagulation defect, unspecified: Secondary | ICD-10-CM | POA: Diagnosis not present

## 2014-04-12 DIAGNOSIS — D509 Iron deficiency anemia, unspecified: Secondary | ICD-10-CM | POA: Diagnosis not present

## 2014-04-12 DIAGNOSIS — D473 Essential (hemorrhagic) thrombocythemia: Secondary | ICD-10-CM | POA: Diagnosis not present

## 2014-04-12 DIAGNOSIS — D631 Anemia in chronic kidney disease: Secondary | ICD-10-CM | POA: Diagnosis not present

## 2014-04-12 DIAGNOSIS — N186 End stage renal disease: Secondary | ICD-10-CM | POA: Diagnosis not present

## 2014-04-12 DIAGNOSIS — N2581 Secondary hyperparathyroidism of renal origin: Secondary | ICD-10-CM | POA: Diagnosis not present

## 2014-04-12 DIAGNOSIS — E875 Hyperkalemia: Secondary | ICD-10-CM | POA: Diagnosis not present

## 2014-04-13 ENCOUNTER — Ambulatory Visit (INDEPENDENT_AMBULATORY_CARE_PROVIDER_SITE_OTHER): Payer: Medicare Other | Admitting: Family Medicine

## 2014-04-13 ENCOUNTER — Encounter: Payer: Self-pay | Admitting: Family Medicine

## 2014-04-13 VITALS — BP 100/60 | HR 62 | Temp 97.5°F

## 2014-04-13 DIAGNOSIS — B0229 Other postherpetic nervous system involvement: Secondary | ICD-10-CM

## 2014-04-13 DIAGNOSIS — L03115 Cellulitis of right lower limb: Secondary | ICD-10-CM | POA: Diagnosis not present

## 2014-04-13 MED ORDER — OXYCODONE HCL 5 MG PO TABS: ORAL_TABLET | ORAL | Status: DC

## 2014-04-13 MED ORDER — DOXYCYCLINE HYCLATE 100 MG PO TABS: 100.0000 mg | ORAL_TABLET | Freq: Two times a day (BID) | ORAL | Status: DC

## 2014-04-13 NOTE — Patient Instructions (Addendum)
[facility] to assume wound care for all wounds. We can provide orders or signature on orders for this.   Does appear warmth and redness have worsened so 1 more course of doxycycline. If they think you need further antibiotics, we would need to see you.   Change oxycodone to full tablet in AM and then at 4 PM with 1/2 tab at bedtime as needed.

## 2014-04-13 NOTE — Assessment & Plan Note (Signed)
Patient reopened wound and cellulitis seems to have recurred based off warmth, erythema, pain on exam. We will treat with 7 more days of doxycycline given previous resolution on this. If needed extension past 10 days, consider bactrim. We will have [facility] take over wound care given patient with history of multiple ulcerations. Hopeful they can help with prevention. Legs may need unnaboot.

## 2014-04-13 NOTE — Progress Notes (Signed)
Garret Reddish, MD Phone: [phone]  Subjective:   Ryan Arellano is a 81 y.o. year old very pleasant male patient who presents with the following:  Recurrent cellulitis Wounds were healing on doxycycline and then he finished course. Several days later, redness, warmth and mild pain recurred. Patient admits he scratched and reopened wounds.  ROS-no fever/chills/nausea/vomiting  Postherpetic neuralgia- tends to scratch off lidocaine patches. Taking lyrica. 1/2 oxycodone in AM so 2.5 mg and then again at 4pm. Tends to mildly control pain and wear off within a few hours. Worse before going to dialysis.   Past Medical History- Patient Active Problem List   Diagnosis Date Noted  . End stage renal disease 03/07/2010    Priority: High  . Hypotension 02/21/2010    Priority: High  . Atrial fibrillation 02/21/2010    Priority: High  . Gout 03/22/2010    Priority: Medium  . Postherpetic neuralgia 02/21/2010    Priority: Medium  . THROMBOCYTHEMIA 02/21/2010    Priority: Medium  . GERD (gastroesophageal reflux disease) 01/23/2014    Priority: Low  . Compression fracture 01/23/2014    Priority: Low  . Pressure ulcer of coccygeal region 08/14/2011    Priority: Low  . Pulmonary hypertension 02/21/2010    Priority: Low  . Cellulitis 03/27/2014   Medications- reviewed and updated Current Outpatient Prescriptions  Medication Sig Dispense Refill  . anagrelide (AGRYLIN) 0.5 MG capsule Take 3 tablets (1.5mg ) oral daily (Patient taking differently: 2 mg. Take 4 tablets (2.0mg ) oral daily) 120 capsule 1  . aspirin 81 MG tablet Take 81 mg by mouth daily.      . B Complex-C-Folic Acid (DIALYVITE PO) Take 1 tablet by mouth daily.    . calcium acetate (PHOSLO) 667 MG capsule Take 667 mg by mouth 4 (four) times daily -  with meals and at bedtime.     . colchicine 0.6 MG tablet Take 0.6 mg by mouth 2 (two) times daily. For gout    . diclofenac sodium (VOLTAREN) 1 % GEL Apply 2 g topically 4  (four) times daily. 100 g 11  . febuxostat (ULORIC) 40 MG tablet Take 40 mg by mouth daily.     . midodrine (PROAMATINE) 10 MG tablet Take 10 mg by mouth 3 (three) times daily.    Marland Kitchen neomycin-bacitracin-polymyxin (NEOSPORIN) ointment Apply 1 application topically every 12 (twelve) hours. Apply to spot on back twice daily and keep covered.    Marland Kitchen omeprazole (PRILOSEC) 40 MG capsule Take 40 mg by mouth daily.    Marland Kitchen oxyCODONE (OXY IR/ROXICODONE) 5 MG immediate release tablet Take full tablet by mouth in the AM and full tablet at 4 pm. May take 1/2 tablet at bedtime as needed. 90 tablet 0  . pregabalin (LYRICA) 50 MG capsule Take 2 capsules (100 mg total) by mouth at bedtime. 60 capsule 5  . Sennosides (SENOKOT PO) Take by mouth. Take 1 tablet daily as needed    . vitamin C (ASCORBIC ACID) 250 MG tablet Take 250 mg by mouth daily.    Marland Kitchen lidocaine (LIDODERM) 5 % Place 3 patches onto the skin daily. Place in areas of greatest pain  Up to three patches a day  Remove & Discard patches within 12 hours (Patient not taking: Reported on 04/13/2014) 90 patch 3     Objective: BP 100/60 mmHg  Pulse 62  Temp(Src) 97.5 F (36.4 C)  Wt  Gen: NAD, resting comfortably in chair CV: RRR no murmurs rubs or gallops Lungs: CTAB no  crackles, wheeze, rhonchi 3 wounds remain close, medial wound on right leg has reopened. Warmth and erythema have increased from last visit. Slight purulent drainage.       Assessment/Plan:  Cellulitis Patient reopened wound and cellulitis seems to have recurred based off warmth, erythema, pain on exam. We will treat with 7 more days of doxycycline given previous resolution on this. If needed extension past 10 days, consider bactrim. We will have [facility] take over wound care given patient with history of multiple ulcerations. Hopeful they can help with prevention. Legs may need unnaboot.   Postherpetic neuralgia Mild poor control of pain. Increase oxycodone to 5mg  at 8 am and 4 pm with  1/2 tab prn at night.    Return precautions advised.  Meds ordered this encounter  Medications  . oxyCODONE (OXY IR/ROXICODONE) 5 MG immediate release tablet    Sig: Take full tablet by mouth in the AM and full tablet at 4 pm. May take 1/2 tablet at bedtime as needed.    Dispense:  90 tablet    Refill:  0  . doxycycline (VIBRA-TABS) 100 MG tablet    Sig: Take 1 tablet (100 mg total) by mouth 2 (two) times daily.    Dispense:  14 tablet    Refill:  0

## 2014-04-13 NOTE — Assessment & Plan Note (Addendum)
Mild poor control of pain. Increase oxycodone to 5mg  at 8 am and 4 pm with 1/2 tab prn at night. Continue lyrica and lidocaine patches (patient often pulls off unfortunately)

## 2014-04-14 ENCOUNTER — Ambulatory Visit: Payer: Medicare Other | Admitting: Internal Medicine

## 2014-04-14 DIAGNOSIS — D689 Coagulation defect, unspecified: Secondary | ICD-10-CM | POA: Diagnosis not present

## 2014-04-14 DIAGNOSIS — D631 Anemia in chronic kidney disease: Secondary | ICD-10-CM | POA: Diagnosis not present

## 2014-04-14 DIAGNOSIS — N2581 Secondary hyperparathyroidism of renal origin: Secondary | ICD-10-CM | POA: Diagnosis not present

## 2014-04-14 DIAGNOSIS — D473 Essential (hemorrhagic) thrombocythemia: Secondary | ICD-10-CM | POA: Diagnosis not present

## 2014-04-14 DIAGNOSIS — N186 End stage renal disease: Secondary | ICD-10-CM | POA: Diagnosis not present

## 2014-04-14 DIAGNOSIS — E875 Hyperkalemia: Secondary | ICD-10-CM | POA: Diagnosis not present

## 2014-04-14 DIAGNOSIS — D509 Iron deficiency anemia, unspecified: Secondary | ICD-10-CM | POA: Diagnosis not present

## 2014-04-15 ENCOUNTER — Ambulatory Visit (HOSPITAL_BASED_OUTPATIENT_CLINIC_OR_DEPARTMENT_OTHER): Payer: Medicare Other | Admitting: Internal Medicine

## 2014-04-15 ENCOUNTER — Telehealth: Payer: Self-pay | Admitting: Internal Medicine

## 2014-04-15 ENCOUNTER — Encounter: Payer: Self-pay | Admitting: Internal Medicine

## 2014-04-15 VITALS — BP 99/57 | HR 88 | Temp 97.7°F | Resp 18 | Ht 65.0 in | Wt 128.0 lb

## 2014-04-15 DIAGNOSIS — L03116 Cellulitis of left lower limb: Secondary | ICD-10-CM | POA: Diagnosis not present

## 2014-04-15 DIAGNOSIS — L97911 Non-pressure chronic ulcer of unspecified part of right lower leg limited to breakdown of skin: Secondary | ICD-10-CM | POA: Diagnosis not present

## 2014-04-15 DIAGNOSIS — L03115 Cellulitis of right lower limb: Secondary | ICD-10-CM | POA: Diagnosis not present

## 2014-04-15 DIAGNOSIS — D473 Essential (hemorrhagic) thrombocythemia: Secondary | ICD-10-CM

## 2014-04-15 DIAGNOSIS — L97921 Non-pressure chronic ulcer of unspecified part of left lower leg limited to breakdown of skin: Secondary | ICD-10-CM | POA: Diagnosis not present

## 2014-04-15 DIAGNOSIS — I4891 Unspecified atrial fibrillation: Secondary | ICD-10-CM | POA: Diagnosis not present

## 2014-04-15 DIAGNOSIS — I872 Venous insufficiency (chronic) (peripheral): Secondary | ICD-10-CM | POA: Diagnosis not present

## 2014-04-15 DIAGNOSIS — I959 Hypotension, unspecified: Secondary | ICD-10-CM | POA: Diagnosis not present

## 2014-04-15 DIAGNOSIS — F0390 Unspecified dementia without behavioral disturbance: Secondary | ICD-10-CM | POA: Diagnosis not present

## 2014-04-15 DIAGNOSIS — N186 End stage renal disease: Secondary | ICD-10-CM | POA: Diagnosis not present

## 2014-04-15 DIAGNOSIS — L89312 Pressure ulcer of right buttock, stage 2: Secondary | ICD-10-CM | POA: Diagnosis not present

## 2014-04-15 NOTE — Telephone Encounter (Signed)
gv adn printed appt sched and avs for pt for April  °

## 2014-04-15 NOTE — Progress Notes (Signed)
[facility] [phone]   Fax:([phone]) [place], [place] [address]  DIAGNOSIS:  1) Essential thrombocythemia  2) end-stage renal disease currently on hemodialysis on Tuesday, Thursday and Saturday  CURRENT THERAPY: Anagrelide 2.0 mg by mouth daily.  INTERVAL HISTORY: Ryan Arellano 81 y.o. male returns to the clinic today for followup visit accompanied his daughter. He is currently receiving treatment with doxycycline for cellulitis of the lower extremities He denied having any significant bleeding issues. He is currently on anagrelide 2.0 mg by mouth daily which was increased from 1.5 mg last week after the CBC performed on 03/27/2014 showed elevated platelets count of 494,000. He is tolerating it fairly well. He has no nausea or vomiting, no fever or chills. The patient denied having any significant chest pain but continues to have shortness breath with exertion with no cough or hemoptysis. The patient is here today for evaluation and discussion of his lab results and recommendation regarding treatment of the essential thrombocythemia.  MEDICAL HISTORY: Past Medical History  Diagnosis Date  . Pulmonary hypertension   . Atrial fibrillation   . Thrombocytopenia   . Anemia   . Arthritis   . Diverticulitis   . Hypertension   . Renal insufficiency   . Skin lesion of back   . DIVERTICULITIS, HX OF 02/21/2010          ALLERGIES:  is allergic to shellfish allergy.  MEDICATIONS:  Current Outpatient Prescriptions  Medication Sig Dispense Refill  . anagrelide (AGRYLIN) 0.5 MG capsule Take 3 tablets (1.5mg ) oral daily (Patient taking differently: 2 mg. Take 4 tablets (2.0mg ) oral daily) 120 capsule 1  . aspirin 81 MG tablet Take 81 mg by mouth daily.      . B Complex-C-Folic Acid (DIALYVITE PO) Take 1 tablet by mouth daily.    . calcium acetate (PHOSLO) 667 MG capsule Take 667 mg by  mouth 4 (four) times daily -  with meals and at bedtime.     . colchicine 0.6 MG tablet Take 0.6 mg by mouth 2 (two) times daily. For gout    . diclofenac sodium (VOLTAREN) 1 % GEL Apply 2 g topically 4 (four) times daily. 100 g 11  . doxycycline (VIBRA-TABS) 100 MG tablet Take 1 tablet (100 mg total) by mouth 2 (two) times daily. 14 tablet 0  . febuxostat (ULORIC) 40 MG tablet Take 40 mg by mouth daily.     Marland Kitchen lidocaine (LIDODERM) 5 % Place 3 patches onto the skin daily. Place in areas of greatest pain  Up to three patches a day  Remove & Discard patches within 12 hours 90 patch 3  . midodrine (PROAMATINE) 10 MG tablet Take 10 mg by mouth 3 (three) times daily.    Marland Kitchen neomycin-bacitracin-polymyxin (NEOSPORIN) ointment Apply 1 application topically every 12 (twelve) hours. Apply to spot on back twice daily and keep covered.    Marland Kitchen omeprazole (PRILOSEC) 40 MG capsule Take 40 mg by mouth daily.    Marland Kitchen oxyCODONE (OXY IR/ROXICODONE) 5 MG immediate release tablet Take full tablet by mouth in the AM and full tablet at 4 pm. May take 1/2 tablet at bedtime as needed. 90 tablet 0  . pregabalin (LYRICA) 50 MG capsule Take 2 capsules (100 mg total) by mouth at bedtime. 60 capsule 5  . Sennosides (SENOKOT PO) Take by mouth. Take 1 tablet daily as needed    . vitamin  C (ASCORBIC ACID) 250 MG tablet Take 250 mg by mouth daily.     No current facility-administered medications for this visit.    SURGICAL HISTORY:  Past Surgical History  Procedure Laterality Date  . Ruptured colon  1980  . S/p colectomy  1980  . Tonsillectomy    . Av fistula placement  2002    left arm    REVIEW OF SYSTEMS:  A comprehensive review of systems was negative except for: Constitutional: positive for fatigue Cellulitis of the lower extremity Cellulitis of the lower extremity   PHYSICAL EXAMINATION: General appearance: alert, cooperative, fatigued and no distress Head: Normocephalic, without obvious abnormality, atraumatic Neck:  no adenopathy Lymph nodes: Cervical, supraclavicular, and axillary nodes normal. Resp: clear to auscultation bilaterally Cardio: regular rate and rhythm, S1, S2 normal, no murmur, click, rub or gallop GI: soft, non-tender; bowel sounds normal; no masses,  no organomegaly Extremities: Skin ulcer and cellulitis of the chin of the lower extremities bilaterally  ECOG PERFORMANCE STATUS: 2 - Symptomatic, <50% confined to bed  Blood pressure 99/57, pulse 88, temperature 97.7 F (36.5 C), temperature source Oral, resp. rate 18, height 5\' 5"  (1.651 m), weight 128 lb (58.06 kg), SpO2 97 %.  LABORATORY DATA: Platelets count 494,000  RADIOGRAPHIC STUDIES: No results found.  ASSESSMENT AND PLAN: This is a very pleasant 81 years old white male with history of essential thrombocythemia who was on anagrelide 1.5 mg by mouth daily. The last CBC performed at at the dialysis center on 03/27/2014 showed platelets count of 494,000 I recommended for the patient to increase his dose of anagrelide to 2.0 mg by mouth daily. He'll continue to have repeat CBC as scheduled at the dialysis center. I will see the patient back for followup visit in 3 months for reevaluation and further adjustment of his dose of anagrelide based on the platelets count.  He will continue treatment for cellulitis by his primary care physician.  He was advised to call immediately if he has any concerning symptoms or significant abnormality in his platelet count.  The patient voices understanding of current disease status and treatment options and is in agreement with the current care plan.  All questions were answered. The patient knows to call the clinic with any problems, questions or concerns. We can certainly see the patient much sooner if necessary.  Disclaimer: This note was dictated with voice recognition software. Similar sounding words can inadvertently be transcribed and may not be corrected upon review.

## 2014-04-16 DIAGNOSIS — D509 Iron deficiency anemia, unspecified: Secondary | ICD-10-CM | POA: Diagnosis not present

## 2014-04-16 DIAGNOSIS — D473 Essential (hemorrhagic) thrombocythemia: Secondary | ICD-10-CM | POA: Diagnosis not present

## 2014-04-16 DIAGNOSIS — D631 Anemia in chronic kidney disease: Secondary | ICD-10-CM | POA: Diagnosis not present

## 2014-04-16 DIAGNOSIS — N186 End stage renal disease: Secondary | ICD-10-CM | POA: Diagnosis not present

## 2014-04-16 DIAGNOSIS — E875 Hyperkalemia: Secondary | ICD-10-CM | POA: Diagnosis not present

## 2014-04-16 DIAGNOSIS — D689 Coagulation defect, unspecified: Secondary | ICD-10-CM | POA: Diagnosis not present

## 2014-04-16 DIAGNOSIS — N2581 Secondary hyperparathyroidism of renal origin: Secondary | ICD-10-CM | POA: Diagnosis not present

## 2014-04-17 DIAGNOSIS — I4891 Unspecified atrial fibrillation: Secondary | ICD-10-CM | POA: Diagnosis not present

## 2014-04-17 DIAGNOSIS — L97921 Non-pressure chronic ulcer of unspecified part of left lower leg limited to breakdown of skin: Secondary | ICD-10-CM | POA: Diagnosis not present

## 2014-04-17 DIAGNOSIS — L03116 Cellulitis of left lower limb: Secondary | ICD-10-CM | POA: Diagnosis not present

## 2014-04-17 DIAGNOSIS — I872 Venous insufficiency (chronic) (peripheral): Secondary | ICD-10-CM | POA: Diagnosis not present

## 2014-04-17 DIAGNOSIS — N186 End stage renal disease: Secondary | ICD-10-CM | POA: Diagnosis not present

## 2014-04-17 DIAGNOSIS — L03115 Cellulitis of right lower limb: Secondary | ICD-10-CM | POA: Diagnosis not present

## 2014-04-17 DIAGNOSIS — L89312 Pressure ulcer of right buttock, stage 2: Secondary | ICD-10-CM | POA: Diagnosis not present

## 2014-04-17 DIAGNOSIS — L97911 Non-pressure chronic ulcer of unspecified part of right lower leg limited to breakdown of skin: Secondary | ICD-10-CM | POA: Diagnosis not present

## 2014-04-17 DIAGNOSIS — I959 Hypotension, unspecified: Secondary | ICD-10-CM | POA: Diagnosis not present

## 2014-04-17 DIAGNOSIS — F0390 Unspecified dementia without behavioral disturbance: Secondary | ICD-10-CM | POA: Diagnosis not present

## 2014-04-18 DIAGNOSIS — E875 Hyperkalemia: Secondary | ICD-10-CM | POA: Diagnosis not present

## 2014-04-18 DIAGNOSIS — D473 Essential (hemorrhagic) thrombocythemia: Secondary | ICD-10-CM | POA: Diagnosis not present

## 2014-04-18 DIAGNOSIS — N2581 Secondary hyperparathyroidism of renal origin: Secondary | ICD-10-CM | POA: Diagnosis not present

## 2014-04-18 DIAGNOSIS — D631 Anemia in chronic kidney disease: Secondary | ICD-10-CM | POA: Diagnosis not present

## 2014-04-18 DIAGNOSIS — D689 Coagulation defect, unspecified: Secondary | ICD-10-CM | POA: Diagnosis not present

## 2014-04-18 DIAGNOSIS — D509 Iron deficiency anemia, unspecified: Secondary | ICD-10-CM | POA: Diagnosis not present

## 2014-04-18 DIAGNOSIS — N186 End stage renal disease: Secondary | ICD-10-CM | POA: Diagnosis not present

## 2014-04-20 DIAGNOSIS — N186 End stage renal disease: Secondary | ICD-10-CM | POA: Diagnosis not present

## 2014-04-20 DIAGNOSIS — L03115 Cellulitis of right lower limb: Secondary | ICD-10-CM | POA: Diagnosis not present

## 2014-04-20 DIAGNOSIS — L97921 Non-pressure chronic ulcer of unspecified part of left lower leg limited to breakdown of skin: Secondary | ICD-10-CM | POA: Diagnosis not present

## 2014-04-20 DIAGNOSIS — L89312 Pressure ulcer of right buttock, stage 2: Secondary | ICD-10-CM | POA: Diagnosis not present

## 2014-04-20 DIAGNOSIS — I4891 Unspecified atrial fibrillation: Secondary | ICD-10-CM | POA: Diagnosis not present

## 2014-04-20 DIAGNOSIS — F0390 Unspecified dementia without behavioral disturbance: Secondary | ICD-10-CM | POA: Diagnosis not present

## 2014-04-20 DIAGNOSIS — L97911 Non-pressure chronic ulcer of unspecified part of right lower leg limited to breakdown of skin: Secondary | ICD-10-CM | POA: Diagnosis not present

## 2014-04-20 DIAGNOSIS — I959 Hypotension, unspecified: Secondary | ICD-10-CM | POA: Diagnosis not present

## 2014-04-20 DIAGNOSIS — L03116 Cellulitis of left lower limb: Secondary | ICD-10-CM | POA: Diagnosis not present

## 2014-04-20 DIAGNOSIS — I872 Venous insufficiency (chronic) (peripheral): Secondary | ICD-10-CM | POA: Diagnosis not present

## 2014-04-21 ENCOUNTER — Telehealth: Payer: Self-pay | Admitting: Family Medicine

## 2014-04-21 DIAGNOSIS — D689 Coagulation defect, unspecified: Secondary | ICD-10-CM | POA: Diagnosis not present

## 2014-04-21 DIAGNOSIS — D509 Iron deficiency anemia, unspecified: Secondary | ICD-10-CM | POA: Diagnosis not present

## 2014-04-21 DIAGNOSIS — N186 End stage renal disease: Secondary | ICD-10-CM | POA: Diagnosis not present

## 2014-04-21 DIAGNOSIS — D631 Anemia in chronic kidney disease: Secondary | ICD-10-CM | POA: Diagnosis not present

## 2014-04-21 DIAGNOSIS — E875 Hyperkalemia: Secondary | ICD-10-CM | POA: Diagnosis not present

## 2014-04-21 DIAGNOSIS — D473 Essential (hemorrhagic) thrombocythemia: Secondary | ICD-10-CM | POA: Diagnosis not present

## 2014-04-21 DIAGNOSIS — N2581 Secondary hyperparathyroidism of renal origin: Secondary | ICD-10-CM | POA: Diagnosis not present

## 2014-04-21 DIAGNOSIS — L8990 Pressure ulcer of unspecified site, unspecified stage: Secondary | ICD-10-CM | POA: Diagnosis not present

## 2014-04-21 NOTE — Telephone Encounter (Signed)
Patient needs a VO called in for medical supplies. Order was faxed Friday and they need it for a pressure ulcer. Needs orders for gauze, tape and saliene, Curlex bandages, Silver alginat,  please. His acct # is [id number]. Thank you.

## 2014-04-21 NOTE — Telephone Encounter (Signed)
LM on [place] VM approving the verbal order

## 2014-04-22 DIAGNOSIS — L89312 Pressure ulcer of right buttock, stage 2: Secondary | ICD-10-CM | POA: Diagnosis not present

## 2014-04-22 DIAGNOSIS — I959 Hypotension, unspecified: Secondary | ICD-10-CM | POA: Diagnosis not present

## 2014-04-22 DIAGNOSIS — L8990 Pressure ulcer of unspecified site, unspecified stage: Secondary | ICD-10-CM | POA: Diagnosis not present

## 2014-04-22 DIAGNOSIS — I872 Venous insufficiency (chronic) (peripheral): Secondary | ICD-10-CM | POA: Diagnosis not present

## 2014-04-22 DIAGNOSIS — L03115 Cellulitis of right lower limb: Secondary | ICD-10-CM | POA: Diagnosis not present

## 2014-04-22 DIAGNOSIS — N186 End stage renal disease: Secondary | ICD-10-CM | POA: Diagnosis not present

## 2014-04-22 DIAGNOSIS — L97921 Non-pressure chronic ulcer of unspecified part of left lower leg limited to breakdown of skin: Secondary | ICD-10-CM | POA: Diagnosis not present

## 2014-04-22 DIAGNOSIS — L97911 Non-pressure chronic ulcer of unspecified part of right lower leg limited to breakdown of skin: Secondary | ICD-10-CM | POA: Diagnosis not present

## 2014-04-22 DIAGNOSIS — L03116 Cellulitis of left lower limb: Secondary | ICD-10-CM | POA: Diagnosis not present

## 2014-04-22 DIAGNOSIS — F0390 Unspecified dementia without behavioral disturbance: Secondary | ICD-10-CM | POA: Diagnosis not present

## 2014-04-22 DIAGNOSIS — I4891 Unspecified atrial fibrillation: Secondary | ICD-10-CM | POA: Diagnosis not present

## 2014-04-23 ENCOUNTER — Telehealth: Payer: Self-pay

## 2014-04-23 DIAGNOSIS — D473 Essential (hemorrhagic) thrombocythemia: Secondary | ICD-10-CM | POA: Diagnosis not present

## 2014-04-23 DIAGNOSIS — D509 Iron deficiency anemia, unspecified: Secondary | ICD-10-CM | POA: Diagnosis not present

## 2014-04-23 DIAGNOSIS — N186 End stage renal disease: Secondary | ICD-10-CM | POA: Diagnosis not present

## 2014-04-23 DIAGNOSIS — E875 Hyperkalemia: Secondary | ICD-10-CM | POA: Diagnosis not present

## 2014-04-23 DIAGNOSIS — N2581 Secondary hyperparathyroidism of renal origin: Secondary | ICD-10-CM | POA: Diagnosis not present

## 2014-04-23 DIAGNOSIS — D689 Coagulation defect, unspecified: Secondary | ICD-10-CM | POA: Diagnosis not present

## 2014-04-23 DIAGNOSIS — D631 Anemia in chronic kidney disease: Secondary | ICD-10-CM | POA: Diagnosis not present

## 2014-04-23 NOTE — Telephone Encounter (Signed)
(  FYI) resident was found sitting the floor screaming for help. No bruises or red area noticed. Pt states he just lost his balance and fell.

## 2014-04-24 DIAGNOSIS — F0390 Unspecified dementia without behavioral disturbance: Secondary | ICD-10-CM | POA: Diagnosis not present

## 2014-04-24 DIAGNOSIS — L97921 Non-pressure chronic ulcer of unspecified part of left lower leg limited to breakdown of skin: Secondary | ICD-10-CM | POA: Diagnosis not present

## 2014-04-24 DIAGNOSIS — I872 Venous insufficiency (chronic) (peripheral): Secondary | ICD-10-CM | POA: Diagnosis not present

## 2014-04-24 DIAGNOSIS — L03116 Cellulitis of left lower limb: Secondary | ICD-10-CM | POA: Diagnosis not present

## 2014-04-24 DIAGNOSIS — L89312 Pressure ulcer of right buttock, stage 2: Secondary | ICD-10-CM | POA: Diagnosis not present

## 2014-04-24 DIAGNOSIS — I4891 Unspecified atrial fibrillation: Secondary | ICD-10-CM | POA: Diagnosis not present

## 2014-04-24 DIAGNOSIS — L03115 Cellulitis of right lower limb: Secondary | ICD-10-CM | POA: Diagnosis not present

## 2014-04-24 DIAGNOSIS — N186 End stage renal disease: Secondary | ICD-10-CM | POA: Diagnosis not present

## 2014-04-24 DIAGNOSIS — L97911 Non-pressure chronic ulcer of unspecified part of right lower leg limited to breakdown of skin: Secondary | ICD-10-CM | POA: Diagnosis not present

## 2014-04-24 DIAGNOSIS — I959 Hypotension, unspecified: Secondary | ICD-10-CM | POA: Diagnosis not present

## 2014-04-25 DIAGNOSIS — E875 Hyperkalemia: Secondary | ICD-10-CM | POA: Diagnosis not present

## 2014-04-25 DIAGNOSIS — D473 Essential (hemorrhagic) thrombocythemia: Secondary | ICD-10-CM | POA: Diagnosis not present

## 2014-04-25 DIAGNOSIS — D689 Coagulation defect, unspecified: Secondary | ICD-10-CM | POA: Diagnosis not present

## 2014-04-25 DIAGNOSIS — N186 End stage renal disease: Secondary | ICD-10-CM | POA: Diagnosis not present

## 2014-04-25 DIAGNOSIS — N2581 Secondary hyperparathyroidism of renal origin: Secondary | ICD-10-CM | POA: Diagnosis not present

## 2014-04-25 DIAGNOSIS — D631 Anemia in chronic kidney disease: Secondary | ICD-10-CM | POA: Diagnosis not present

## 2014-04-25 DIAGNOSIS — D509 Iron deficiency anemia, unspecified: Secondary | ICD-10-CM | POA: Diagnosis not present

## 2014-04-27 ENCOUNTER — Telehealth: Payer: Self-pay | Admitting: Family Medicine

## 2014-04-27 DIAGNOSIS — L97911 Non-pressure chronic ulcer of unspecified part of right lower leg limited to breakdown of skin: Secondary | ICD-10-CM | POA: Diagnosis not present

## 2014-04-27 DIAGNOSIS — F0390 Unspecified dementia without behavioral disturbance: Secondary | ICD-10-CM | POA: Diagnosis not present

## 2014-04-27 DIAGNOSIS — I4891 Unspecified atrial fibrillation: Secondary | ICD-10-CM | POA: Diagnosis not present

## 2014-04-27 DIAGNOSIS — L03116 Cellulitis of left lower limb: Secondary | ICD-10-CM | POA: Diagnosis not present

## 2014-04-27 DIAGNOSIS — I959 Hypotension, unspecified: Secondary | ICD-10-CM | POA: Diagnosis not present

## 2014-04-27 DIAGNOSIS — L89312 Pressure ulcer of right buttock, stage 2: Secondary | ICD-10-CM | POA: Diagnosis not present

## 2014-04-27 DIAGNOSIS — L03115 Cellulitis of right lower limb: Secondary | ICD-10-CM | POA: Diagnosis not present

## 2014-04-27 DIAGNOSIS — I872 Venous insufficiency (chronic) (peripheral): Secondary | ICD-10-CM | POA: Diagnosis not present

## 2014-04-27 DIAGNOSIS — L97921 Non-pressure chronic ulcer of unspecified part of left lower leg limited to breakdown of skin: Secondary | ICD-10-CM | POA: Diagnosis not present

## 2014-04-27 DIAGNOSIS — N186 End stage renal disease: Secondary | ICD-10-CM | POA: Diagnosis not present

## 2014-04-27 NOTE — Telephone Encounter (Signed)
See below

## 2014-04-27 NOTE — Telephone Encounter (Signed)
Let's decrease to 1/2 tab of oxycodone at 8 am and 4pm scheduled. 1/2 tab as needed at night. They can send [place] an order if needed from facility.

## 2014-04-27 NOTE — Telephone Encounter (Signed)
Pt daughter would like her dad to take half of oxycodone in morning instead of 1 pill it makes him loopy also half of pill in evenings. Please advise

## 2014-04-28 DIAGNOSIS — D631 Anemia in chronic kidney disease: Secondary | ICD-10-CM | POA: Diagnosis not present

## 2014-04-28 DIAGNOSIS — D473 Essential (hemorrhagic) thrombocythemia: Secondary | ICD-10-CM | POA: Diagnosis not present

## 2014-04-28 DIAGNOSIS — E875 Hyperkalemia: Secondary | ICD-10-CM | POA: Diagnosis not present

## 2014-04-28 DIAGNOSIS — N186 End stage renal disease: Secondary | ICD-10-CM | POA: Diagnosis not present

## 2014-04-28 DIAGNOSIS — N2581 Secondary hyperparathyroidism of renal origin: Secondary | ICD-10-CM | POA: Diagnosis not present

## 2014-04-28 DIAGNOSIS — D689 Coagulation defect, unspecified: Secondary | ICD-10-CM | POA: Diagnosis not present

## 2014-04-28 DIAGNOSIS — D509 Iron deficiency anemia, unspecified: Secondary | ICD-10-CM | POA: Diagnosis not present

## 2014-04-28 NOTE — Telephone Encounter (Signed)
Daughter notified 

## 2014-04-29 ENCOUNTER — Other Ambulatory Visit: Payer: Self-pay | Admitting: *Deleted

## 2014-04-29 DIAGNOSIS — N186 End stage renal disease: Secondary | ICD-10-CM | POA: Diagnosis not present

## 2014-04-29 DIAGNOSIS — L89312 Pressure ulcer of right buttock, stage 2: Secondary | ICD-10-CM

## 2014-04-29 DIAGNOSIS — L03116 Cellulitis of left lower limb: Secondary | ICD-10-CM | POA: Diagnosis not present

## 2014-04-29 DIAGNOSIS — F0390 Unspecified dementia without behavioral disturbance: Secondary | ICD-10-CM | POA: Diagnosis not present

## 2014-04-29 DIAGNOSIS — L03115 Cellulitis of right lower limb: Secondary | ICD-10-CM | POA: Diagnosis not present

## 2014-04-29 DIAGNOSIS — L97911 Non-pressure chronic ulcer of unspecified part of right lower leg limited to breakdown of skin: Secondary | ICD-10-CM | POA: Diagnosis not present

## 2014-04-29 DIAGNOSIS — I872 Venous insufficiency (chronic) (peripheral): Secondary | ICD-10-CM | POA: Diagnosis not present

## 2014-04-29 DIAGNOSIS — I959 Hypotension, unspecified: Secondary | ICD-10-CM | POA: Diagnosis not present

## 2014-04-29 DIAGNOSIS — L97921 Non-pressure chronic ulcer of unspecified part of left lower leg limited to breakdown of skin: Secondary | ICD-10-CM | POA: Diagnosis not present

## 2014-04-29 DIAGNOSIS — I4891 Unspecified atrial fibrillation: Secondary | ICD-10-CM | POA: Diagnosis not present

## 2014-04-29 MED ORDER — OXYCODONE HCL 5 MG PO TABS: ORAL_TABLET | ORAL | Status: DC

## 2014-04-29 MED ORDER — OXYCODONE HCL 5 MG PO TABS
ORAL_TABLET | ORAL | Status: DC
Start: 2014-04-29 — End: 2014-05-12

## 2014-04-29 MED ORDER — OXYCODONE HCL 5 MG PO TABS
ORAL_TABLET | ORAL | Status: DC
Start: 2014-04-29 — End: 2014-04-29

## 2014-04-30 DIAGNOSIS — N2581 Secondary hyperparathyroidism of renal origin: Secondary | ICD-10-CM | POA: Diagnosis not present

## 2014-04-30 DIAGNOSIS — D689 Coagulation defect, unspecified: Secondary | ICD-10-CM | POA: Diagnosis not present

## 2014-04-30 DIAGNOSIS — E875 Hyperkalemia: Secondary | ICD-10-CM | POA: Diagnosis not present

## 2014-04-30 DIAGNOSIS — L8990 Pressure ulcer of unspecified site, unspecified stage: Secondary | ICD-10-CM | POA: Diagnosis not present

## 2014-04-30 DIAGNOSIS — D631 Anemia in chronic kidney disease: Secondary | ICD-10-CM | POA: Diagnosis not present

## 2014-04-30 DIAGNOSIS — N186 End stage renal disease: Secondary | ICD-10-CM | POA: Diagnosis not present

## 2014-04-30 DIAGNOSIS — D473 Essential (hemorrhagic) thrombocythemia: Secondary | ICD-10-CM | POA: Diagnosis not present

## 2014-04-30 DIAGNOSIS — D509 Iron deficiency anemia, unspecified: Secondary | ICD-10-CM | POA: Diagnosis not present

## 2014-05-02 DIAGNOSIS — D689 Coagulation defect, unspecified: Secondary | ICD-10-CM | POA: Diagnosis not present

## 2014-05-02 DIAGNOSIS — D631 Anemia in chronic kidney disease: Secondary | ICD-10-CM | POA: Diagnosis not present

## 2014-05-02 DIAGNOSIS — E875 Hyperkalemia: Secondary | ICD-10-CM | POA: Diagnosis not present

## 2014-05-02 DIAGNOSIS — N186 End stage renal disease: Secondary | ICD-10-CM | POA: Diagnosis not present

## 2014-05-02 DIAGNOSIS — D473 Essential (hemorrhagic) thrombocythemia: Secondary | ICD-10-CM | POA: Diagnosis not present

## 2014-05-02 DIAGNOSIS — N2581 Secondary hyperparathyroidism of renal origin: Secondary | ICD-10-CM | POA: Diagnosis not present

## 2014-05-02 DIAGNOSIS — D509 Iron deficiency anemia, unspecified: Secondary | ICD-10-CM | POA: Diagnosis not present

## 2014-05-04 ENCOUNTER — Ambulatory Visit (INDEPENDENT_AMBULATORY_CARE_PROVIDER_SITE_OTHER): Payer: Medicare Other | Admitting: Family Medicine

## 2014-05-04 ENCOUNTER — Telehealth: Payer: Self-pay | Admitting: Family Medicine

## 2014-05-04 ENCOUNTER — Encounter: Payer: Self-pay | Admitting: Family Medicine

## 2014-05-04 VITALS — BP 102/50 | HR 95 | Temp 97.5°F | Ht 65.0 in

## 2014-05-04 DIAGNOSIS — L97921 Non-pressure chronic ulcer of unspecified part of left lower leg limited to breakdown of skin: Secondary | ICD-10-CM | POA: Diagnosis not present

## 2014-05-04 DIAGNOSIS — L03115 Cellulitis of right lower limb: Secondary | ICD-10-CM | POA: Diagnosis not present

## 2014-05-04 DIAGNOSIS — L0390 Cellulitis, unspecified: Secondary | ICD-10-CM

## 2014-05-04 DIAGNOSIS — J069 Acute upper respiratory infection, unspecified: Secondary | ICD-10-CM

## 2014-05-04 DIAGNOSIS — F0390 Unspecified dementia without behavioral disturbance: Secondary | ICD-10-CM | POA: Diagnosis not present

## 2014-05-04 DIAGNOSIS — I959 Hypotension, unspecified: Secondary | ICD-10-CM | POA: Diagnosis not present

## 2014-05-04 DIAGNOSIS — L03116 Cellulitis of left lower limb: Secondary | ICD-10-CM | POA: Diagnosis not present

## 2014-05-04 DIAGNOSIS — I4891 Unspecified atrial fibrillation: Secondary | ICD-10-CM | POA: Diagnosis not present

## 2014-05-04 DIAGNOSIS — I872 Venous insufficiency (chronic) (peripheral): Secondary | ICD-10-CM | POA: Diagnosis not present

## 2014-05-04 DIAGNOSIS — L89312 Pressure ulcer of right buttock, stage 2: Secondary | ICD-10-CM | POA: Diagnosis not present

## 2014-05-04 DIAGNOSIS — L97911 Non-pressure chronic ulcer of unspecified part of right lower leg limited to breakdown of skin: Secondary | ICD-10-CM | POA: Diagnosis not present

## 2014-05-04 DIAGNOSIS — N186 End stage renal disease: Secondary | ICD-10-CM

## 2014-05-04 NOTE — Telephone Encounter (Signed)
Pls advise.  

## 2014-05-04 NOTE — Progress Notes (Addendum)
HPI:  Ryan Arellano is a frail elderly pt of Dr. Yong Channel with ESRD and recurrent LE cellulitis here for an acute visit for:  Chronic LE skin changes: -getting wound care  -nurse changes bandages - chronic picker and he was not keeping dressings on -now they are wrapping legs and per daughters the wound nurse said they are looking better -but daughters want [place] to look at his legs as they thought it looked worse  Sore throat: -for 2 days -nasal congestion, cough, a little weaker then usual, appetite a little down - though ate lunch well and ice cream prior to visit -denies: fevers, chills, nausea, vomiting, diarrhea, SOB, dysuria,   ROS: See pertinent positives and negatives per HPI.  Past Medical History  Diagnosis Date  . Pulmonary hypertension   . Atrial fibrillation   . Thrombocytopenia   . Anemia   . Arthritis   . Diverticulitis   . Hypertension   . Renal insufficiency   . Skin lesion of back   . DIVERTICULITIS, HX OF 02/21/2010          Past Surgical History  Procedure Laterality Date  . Ruptured colon  1980  . S/p colectomy  1980  . Tonsillectomy    . Av fistula placement  2002    left arm    Family History  Problem Relation Age of Onset  . Heart disease Father   . Hypertension Father   . Heart disease Mother     History   Social History  . Marital Status: Widowed    Spouse Name: N/A    Number of Children: N/A  . Years of Education: N/A   Occupational History  . retired    Social History Main Topics  . Smoking status: Former Smoker -- 0.50 packs/day for 40 years    Types: Cigarettes    Quit date: 04/11/1979  . Smokeless tobacco: Never Used  . Alcohol Use: Yes     Comment: once per week  . Drug Use: No  . Sexual Activity: No   Other Topics Concern  . None   Social History Narrative   Widower 2009. 3 kids. 6 grandkids. 5 greatgrandkids.    Lives at [facility]. They provide food, meds, cleaning, washing.    Patient  bathes hisself, clothes yourself,    Daughter handles finances. 2 daughters have 38 and financial.       Retired from Primary school teacher: gambling-used to like to go to casino. Tv, newsspaper     Current outpatient prescriptions:  .  anagrelide (AGRYLIN) 0.5 MG capsule, Take 3 tablets (1.5mg ) oral daily (Patient taking differently: 2 mg. Take 4 tablets (2.0mg ) oral daily), Disp: 120 capsule, Rfl: 1 .  aspirin 81 MG tablet, Take 81 mg by mouth daily.  , Disp: , Rfl:  .  B Complex-C-Folic Acid (DIALYVITE PO), Take 1 tablet by mouth daily., Disp: , Rfl:  .  calcium acetate (PHOSLO) 667 MG capsule, Take 667 mg by mouth 4 (four) times daily -  with meals and at bedtime. , Disp: , Rfl:  .  colchicine 0.6 MG tablet, Take 0.6 mg by mouth 2 (two) times daily. For gout, Disp: , Rfl:  .  diclofenac sodium (VOLTAREN) 1 % GEL, Apply 2 g topically 4 (four) times daily., Disp: 100 g, Rfl: 11 .  febuxostat (ULORIC) 40 MG tablet, Take 40 mg by mouth daily. , Disp: , Rfl:  .  lidocaine (LIDODERM) 5 %,  Place 3 patches onto the skin daily. Place in areas of greatest pain  Up to three patches a day  Remove & Discard patches within 12 hours, Disp: 90 patch, Rfl: 3 .  midodrine (PROAMATINE) 10 MG tablet, Take 10 mg by mouth 3 (three) times daily., Disp: , Rfl:  .  neomycin-bacitracin-polymyxin (NEOSPORIN) ointment, Apply 1 application topically every 12 (twelve) hours. Apply to spot on back twice daily and keep covered., Disp: , Rfl:  .  omeprazole (PRILOSEC) 40 MG capsule, Take 40 mg by mouth daily., Disp: , Rfl:  .  oxyCODONE (OXY IR/ROXICODONE) 5 MG immediate release tablet, Take half tab every day at 8 am.  Take 1 tab 4 pm - Monday, Wednesday, Friday, Sunday.  Take 1 tab as needed at 4 pm  Tuesday, Thursday, Saturday.  Take half tab every evening at 8 pm as needed. Fill 06/28/14, Disp: 60 tablet, Rfl: 0 .  oxyCODONE (ROXICODONE) 5 MG immediate release tablet, Take half tab every day at 8 am.  Take 1 tab 4 pm  - Monday, Wednesday, Friday, Sunday.  Take 1 tab as needed Tuesday, Thursday, Saturday.  Take half tab every evening at 4 pm as needed. Fill 06/28/14, Disp: 60 tablet, Rfl: 0 .  pregabalin (LYRICA) 50 MG capsule, Take 2 capsules (100 mg total) by mouth at bedtime., Disp: 60 capsule, Rfl: 5 .  Sennosides (SENOKOT PO), Take by mouth. Take 1 tablet daily as needed, Disp: , Rfl:  .  vitamin C (ASCORBIC ACID) 250 MG tablet, Take 250 mg by mouth daily., Disp: , Rfl:  .  benzonatate (TESSALON) 100 MG capsule, Take 1 capsule (100 mg total) by mouth 2 (two) times daily as needed for cough., Disp: 20 capsule, Rfl: 0  EXAM:  Filed Vitals:   05/04/14 1420  BP: 102/50  Pulse: 95  Temp: 97.5 F (36.4 C)    Body mass index is 0.00 kg/(m^2).  GENERAL: vitals reviewed and listed above, alert, oriented, appears well hydrated and in no acute distress  HEENT: atraumatic, conjunttiva clear, no obvious abnormalities on inspection of external nose and ears, normal appearance of ear canals and TMs, clear nasal congestion, mild post oropharyngeal erythema with PND, no tonsillar edema or exudate, no sinus TTP  NECK: no obvious masses on inspection  LUNGS: clear to auscultation bilaterally, no wheezes, rales or rhonchi, good air movement  CV: HRRR, no peripheral edema  SKIN: both legs wrapped, daughter report they want me to look at R leg as it is worse - dressing removed, healing large ulcer ant R LE with good granulation tissue in wound bed and no surrounding erythema or warmth or signs of infection  MS: moves all extremities without noticeable abnormality  PSYCH: pleasant and cooperative, no obvious depression or anxiety  ASSESSMENT AND PLAN:  Discussed the following assessment and plan:  > 25 minutes spent face to face with pt and family  Cellulitis, unspecified cellulitis site, unspecified extremity site, unspecified laterality -continue wound care, nurse to come in a few hours to his facility to  change dressings -no signs cellulitis today -follow up with Dr. Yong Channel as needed  Acute upper respiratory infection -supportive care w/ tessalon perles, nasal saline, cough drops -return precautions -no signs bacterial infection today  ESRD: -they wanted to know what this meant and we discussed this briefly  -Patient advised to return or notify a doctor immediately if symptoms worsen or persist or new concerns arise.  Patient Instructions  INSTRUCTIONS FOR UPPER RESPIRATORY INFECTION:  -  plenty of rest and fluids  -nasal saline wash 2-3 times daily (use prepackaged nasal saline or bottled/distilled water if making your own)   -clean nose with nasal saline before using the nasal steroid or sinex  -in the winter time, using a humidifier at night is helpful (please follow cleaning instructions)  -if you are taking a cough medication - use only as directed, may also try a teaspoon of honey to coat the throat and throat lozenges  -for sore throat, salt water gargles can help  -follow up if you have fevers, facial pain, tooth pain, difficulty breathing or are worsening or not getting better in 5-7 days  Continue wound care for lower extremity wounds and follow up with Dr. Yong Channel if any concerns     Lucretia Kern.

## 2014-05-04 NOTE — Telephone Encounter (Signed)
Pt is being seen today by Dr. Maudie Mercury; Dr. Yong Channel is aware and will speak with Dr. Maudie Mercury about pt.

## 2014-05-04 NOTE — Patient Instructions (Signed)
INSTRUCTIONS FOR UPPER RESPIRATORY INFECTION:  -plenty of rest and fluids  -nasal saline wash 2-3 times daily (use prepackaged nasal saline or bottled/distilled water if making your own)   -clean nose with nasal saline before using the nasal steroid or sinex  -in the winter time, using a humidifier at night is helpful (please follow cleaning instructions)  -if you are taking a cough medication - use only as directed, may also try a teaspoon of honey to coat the throat and throat lozenges  -for sore throat, salt water gargles can help  -follow up if you have fevers, facial pain, tooth pain, difficulty breathing or are worsening or not getting better in 5-7 days  Continue wound care for lower extremity wounds and follow up with Dr. Yong Channel if any concerns

## 2014-05-04 NOTE — Telephone Encounter (Signed)
Patient Name: Ryan Arellano DOB: February 21, 1922 Initial Comment caller states her father is in an assisted living - he is not eating and is very weak- dtr would like the dr to come out to the assisted living and assess him Nurse Assessment Nurse: Vallery Sa, RN, [place] Date/Time (Eastern Time): 05/04/2014 12:04:35 PM Confirm and document reason for call. If symptomatic, describe symptoms. ---Daughter states her father's cellulitis of his leg is worse. He developed a decreased appetite 2 days ago and is much weaker today. No fever. No breathing difficulty. Has the patient traveled out of the country within the last 30 days? ---No Does the patient require triage? ---Yes Related visit to physician within the last 2 weeks? ---Yes Does the PT have any chronic conditions? (i.e. diabetes, asthma, etc.) ---Yes List chronic conditions. ---Cellulitis of lower leg (completed antibiotics 04/20/13) and on Dialysis Guidelines Guideline Title Affirmed Question Affirmed Notes Final Disposition User Clinical Call [place], RN, [place] declines triage services and requests to only speak with someone at the office regarding having a MD come to her father's [place] to see him. Called the office backline and Estill Bamberg will assist [place] further.

## 2014-05-04 NOTE — Progress Notes (Signed)
Pre visit review using our clinic review tool, if applicable. No additional management support is needed unless otherwise documented below in the visit note. 

## 2014-05-04 NOTE — Telephone Encounter (Signed)
We do not go to assisted livings. He would have to consider a skilled nursing facility if he wants this or finding an office where PCPs do home visits-we do not offer that at [facility]. Has would care restarted seeing patient? They need to restart if not.

## 2014-05-04 NOTE — Addendum Note (Signed)
Addended by: Lucretia Kern on: 05/04/2014 03:02 PM   Modules accepted: Orders

## 2014-05-05 DIAGNOSIS — D689 Coagulation defect, unspecified: Secondary | ICD-10-CM | POA: Diagnosis not present

## 2014-05-05 DIAGNOSIS — N2581 Secondary hyperparathyroidism of renal origin: Secondary | ICD-10-CM | POA: Diagnosis not present

## 2014-05-05 DIAGNOSIS — D473 Essential (hemorrhagic) thrombocythemia: Secondary | ICD-10-CM | POA: Diagnosis not present

## 2014-05-05 DIAGNOSIS — D631 Anemia in chronic kidney disease: Secondary | ICD-10-CM | POA: Diagnosis not present

## 2014-05-05 DIAGNOSIS — E875 Hyperkalemia: Secondary | ICD-10-CM | POA: Diagnosis not present

## 2014-05-05 DIAGNOSIS — N186 End stage renal disease: Secondary | ICD-10-CM | POA: Diagnosis not present

## 2014-05-05 DIAGNOSIS — D509 Iron deficiency anemia, unspecified: Secondary | ICD-10-CM | POA: Diagnosis not present

## 2014-05-06 ENCOUNTER — Encounter (HOSPITAL_COMMUNITY): Payer: Self-pay

## 2014-05-06 ENCOUNTER — Inpatient Hospital Stay (HOSPITAL_COMMUNITY)
Admission: EM | Admit: 2014-05-06 | Discharge: 2014-05-12 | DRG: 640 | Disposition: A | Discharge status: Skilled Nursing Facility | Payer: Medicare Other | Attending: Internal Medicine | Admitting: Internal Medicine

## 2014-05-06 ENCOUNTER — Emergency Department (HOSPITAL_COMMUNITY): Payer: Medicare Other

## 2014-05-06 ENCOUNTER — Ambulatory Visit: Payer: Medicare Other | Admitting: Family Medicine

## 2014-05-06 DIAGNOSIS — I4891 Unspecified atrial fibrillation: Secondary | ICD-10-CM | POA: Diagnosis present

## 2014-05-06 DIAGNOSIS — H109 Unspecified conjunctivitis: Secondary | ICD-10-CM | POA: Diagnosis present

## 2014-05-06 DIAGNOSIS — D72829 Elevated white blood cell count, unspecified: Secondary | ICD-10-CM

## 2014-05-06 DIAGNOSIS — J90 Pleural effusion, not elsewhere classified: Secondary | ICD-10-CM | POA: Diagnosis not present

## 2014-05-06 DIAGNOSIS — Z7982 Long term (current) use of aspirin: Secondary | ICD-10-CM

## 2014-05-06 DIAGNOSIS — D649 Anemia, unspecified: Secondary | ICD-10-CM | POA: Diagnosis not present

## 2014-05-06 DIAGNOSIS — I272 Other secondary pulmonary hypertension: Secondary | ICD-10-CM | POA: Diagnosis not present

## 2014-05-06 DIAGNOSIS — D473 Essential (hemorrhagic) thrombocythemia: Secondary | ICD-10-CM | POA: Diagnosis not present

## 2014-05-06 DIAGNOSIS — I872 Venous insufficiency (chronic) (peripheral): Secondary | ICD-10-CM | POA: Diagnosis not present

## 2014-05-06 DIAGNOSIS — Z66 Do not resuscitate: Secondary | ICD-10-CM | POA: Diagnosis not present

## 2014-05-06 DIAGNOSIS — Z515 Encounter for palliative care: Secondary | ICD-10-CM

## 2014-05-06 DIAGNOSIS — L97921 Non-pressure chronic ulcer of unspecified part of left lower leg limited to breakdown of skin: Secondary | ICD-10-CM | POA: Diagnosis not present

## 2014-05-06 DIAGNOSIS — R4182 Altered mental status, unspecified: Secondary | ICD-10-CM | POA: Insufficient documentation

## 2014-05-06 DIAGNOSIS — R627 Adult failure to thrive: Principal | ICD-10-CM | POA: Diagnosis present

## 2014-05-06 DIAGNOSIS — R531 Weakness: Secondary | ICD-10-CM | POA: Diagnosis not present

## 2014-05-06 DIAGNOSIS — Z7901 Long term (current) use of anticoagulants: Secondary | ICD-10-CM | POA: Diagnosis not present

## 2014-05-06 DIAGNOSIS — F419 Anxiety disorder, unspecified: Secondary | ICD-10-CM | POA: Diagnosis not present

## 2014-05-06 DIAGNOSIS — Z992 Dependence on renal dialysis: Secondary | ICD-10-CM

## 2014-05-06 DIAGNOSIS — B0229 Other postherpetic nervous system involvement: Secondary | ICD-10-CM | POA: Diagnosis present

## 2014-05-06 DIAGNOSIS — L0390 Cellulitis, unspecified: Secondary | ICD-10-CM | POA: Diagnosis not present

## 2014-05-06 DIAGNOSIS — R404 Transient alteration of awareness: Secondary | ICD-10-CM | POA: Diagnosis not present

## 2014-05-06 DIAGNOSIS — M1990 Unspecified osteoarthritis, unspecified site: Secondary | ICD-10-CM | POA: Diagnosis not present

## 2014-05-06 DIAGNOSIS — E876 Hypokalemia: Secondary | ICD-10-CM | POA: Diagnosis present

## 2014-05-06 DIAGNOSIS — Z9842 Cataract extraction status, left eye: Secondary | ICD-10-CM

## 2014-05-06 DIAGNOSIS — N2581 Secondary hyperparathyroidism of renal origin: Secondary | ICD-10-CM | POA: Diagnosis present

## 2014-05-06 DIAGNOSIS — L03116 Cellulitis of left lower limb: Secondary | ICD-10-CM | POA: Diagnosis not present

## 2014-05-06 DIAGNOSIS — J811 Chronic pulmonary edema: Secondary | ICD-10-CM | POA: Diagnosis not present

## 2014-05-06 DIAGNOSIS — Z87891 Personal history of nicotine dependence: Secondary | ICD-10-CM

## 2014-05-06 DIAGNOSIS — L03115 Cellulitis of right lower limb: Secondary | ICD-10-CM | POA: Diagnosis not present

## 2014-05-06 DIAGNOSIS — I480 Paroxysmal atrial fibrillation: Secondary | ICD-10-CM | POA: Diagnosis not present

## 2014-05-06 DIAGNOSIS — R630 Anorexia: Secondary | ICD-10-CM | POA: Diagnosis not present

## 2014-05-06 DIAGNOSIS — Z9841 Cataract extraction status, right eye: Secondary | ICD-10-CM

## 2014-05-06 DIAGNOSIS — I120 Hypertensive chronic kidney disease with stage 5 chronic kidney disease or end stage renal disease: Secondary | ICD-10-CM | POA: Diagnosis not present

## 2014-05-06 DIAGNOSIS — R5383 Other fatigue: Secondary | ICD-10-CM | POA: Diagnosis not present

## 2014-05-06 DIAGNOSIS — J9811 Atelectasis: Secondary | ICD-10-CM | POA: Diagnosis not present

## 2014-05-06 DIAGNOSIS — I959 Hypotension, unspecified: Secondary | ICD-10-CM | POA: Diagnosis not present

## 2014-05-06 DIAGNOSIS — N289 Disorder of kidney and ureter, unspecified: Secondary | ICD-10-CM | POA: Diagnosis present

## 2014-05-06 DIAGNOSIS — F0390 Unspecified dementia without behavioral disturbance: Secondary | ICD-10-CM | POA: Diagnosis not present

## 2014-05-06 DIAGNOSIS — Z91013 Allergy to seafood: Secondary | ICD-10-CM

## 2014-05-06 DIAGNOSIS — L89312 Pressure ulcer of right buttock, stage 2: Secondary | ICD-10-CM | POA: Diagnosis not present

## 2014-05-06 DIAGNOSIS — F508 Other eating disorders: Secondary | ICD-10-CM | POA: Diagnosis not present

## 2014-05-06 DIAGNOSIS — R0989 Other specified symptoms and signs involving the circulatory and respiratory systems: Secondary | ICD-10-CM | POA: Diagnosis not present

## 2014-05-06 DIAGNOSIS — L97911 Non-pressure chronic ulcer of unspecified part of right lower leg limited to breakdown of skin: Secondary | ICD-10-CM | POA: Diagnosis not present

## 2014-05-06 DIAGNOSIS — N186 End stage renal disease: Secondary | ICD-10-CM | POA: Diagnosis not present

## 2014-05-06 DIAGNOSIS — E875 Hyperkalemia: Secondary | ICD-10-CM | POA: Diagnosis not present

## 2014-05-06 DIAGNOSIS — I6782 Cerebral ischemia: Secondary | ICD-10-CM | POA: Diagnosis not present

## 2014-05-06 LAB — CBC WITH DIFFERENTIAL/PLATELET
BAND NEUTROPHILS: 0 % (ref 0–10)
BASOS PCT: 0 % (ref 0–1)
Basophils Absolute: 0 10*3/uL (ref 0.0–0.1)
Blasts: 0 %
EOS PCT: 0 % (ref 0–5)
Eosinophils Absolute: 0 10*3/uL (ref 0.0–0.7)
HCT: 41.8 % (ref 39.0–52.0)
HEMOGLOBIN: 12.7 g/dL — AB (ref 13.0–17.0)
Lymphocytes Relative: 12 % (ref 12–46)
Lymphs Abs: 2.4 10*3/uL (ref 0.7–4.0)
MCH: 28.6 pg (ref 26.0–34.0)
MCHC: 30.4 g/dL (ref 30.0–36.0)
MCV: 94.1 fL (ref 78.0–100.0)
MONO ABS: 1.8 10*3/uL — AB (ref 0.1–1.0)
Metamyelocytes Relative: 0 %
Monocytes Relative: 9 % (ref 3–12)
Myelocytes: 0 %
NRBC: 0 /100{WBCs}
Neutro Abs: 15.7 10*3/uL — ABNORMAL HIGH (ref 1.7–7.7)
Neutrophils Relative %: 79 % — ABNORMAL HIGH (ref 43–77)
PLATELETS: 220 10*3/uL (ref 150–400)
Promyelocytes Absolute: 0 %
RBC: 4.44 MIL/uL (ref 4.22–5.81)
RDW: 17.5 % — ABNORMAL HIGH (ref 11.5–15.5)
WBC: 19.9 10*3/uL — ABNORMAL HIGH (ref 4.0–10.5)

## 2014-05-06 LAB — COMPREHENSIVE METABOLIC PANEL
ALT: 15 U/L (ref 0–53)
AST: 40 U/L — ABNORMAL HIGH (ref 0–37)
Albumin: 2.5 g/dL — ABNORMAL LOW (ref 3.5–5.2)
Alkaline Phosphatase: 121 U/L — ABNORMAL HIGH (ref 39–117)
Anion gap: 11 (ref 5–15)
BILIRUBIN TOTAL: 0.9 mg/dL (ref 0.3–1.2)
BUN: 16 mg/dL (ref 6–23)
CALCIUM: 8.7 mg/dL (ref 8.4–10.5)
CO2: 33 mmol/L — ABNORMAL HIGH (ref 19–32)
Chloride: 92 mmol/L — ABNORMAL LOW (ref 96–112)
Creatinine, Ser: 4.25 mg/dL — ABNORMAL HIGH (ref 0.50–1.35)
GFR calc Af Amer: 13 mL/min — ABNORMAL LOW (ref >90)
GFR, EST NON AFRICAN AMERICAN: 11 mL/min — AB (ref >90)
GLUCOSE: 142 mg/dL — AB (ref 70–99)
Potassium: 3.6 mmol/L (ref 3.5–5.1)
Sodium: 136 mmol/L (ref 135–145)
Total Protein: 5.9 g/dL — ABNORMAL LOW (ref 6.0–8.3)

## 2014-05-06 LAB — BRAIN NATRIURETIC PEPTIDE: B Natriuretic Peptide: 897.7 pg/mL — ABNORMAL HIGH (ref 0.0–100.0)

## 2014-05-06 LAB — PROTIME-INR
INR: 1.47 (ref 0.00–1.49)
PROTHROMBIN TIME: 17.9 s — AB (ref 11.6–15.2)

## 2014-05-06 LAB — TROPONIN I: TROPONIN I: 0.06 ng/mL — AB (ref <0.031)

## 2014-05-06 NOTE — ED Notes (Signed)
Lab called to inquire about PT/INR.

## 2014-05-06 NOTE — ED Provider Notes (Signed)
CSN: [id number]     Arrival date & time 05/06/14  2019 History   First MD Initiated Contact with Patient 05/06/14 2019     Chief Complaint  Patient presents with  . Fatigue     (Consider location/radiation/quality/duration/timing/severity/associated sxs/prior Treatment) Patient is a 81 y.o. male presenting with neurologic complaint. History provided by: family.  Neurologic Problem This is a new problem. The current episode started more than 2 days ago. The problem occurs constantly. The problem has been gradually worsening. Pertinent negatives include no chest pain, no abdominal pain, no headaches and no shortness of breath. Nothing aggravates the symptoms. Nothing relieves the symptoms. He has tried nothing for the symptoms. The treatment provided no relief.    Past Medical History  Diagnosis Date  . Pulmonary hypertension   . Atrial fibrillation   . Thrombocytopenia   . Anemia   . Arthritis   . Diverticulitis   . Hypertension   . Renal insufficiency   . Skin lesion of back   . DIVERTICULITIS, HX OF 02/21/2010         Past Surgical History  Procedure Laterality Date  . Ruptured colon  1980  . S/p colectomy  1980  . Tonsillectomy    . Av fistula placement  2002    left arm   Family History  Problem Relation Age of Onset  . Heart disease Father   . Hypertension Father   . Heart disease Mother    History  Substance Use Topics  . Smoking status: Former Smoker -- 0.50 packs/day for 40 years    Types: Cigarettes    Quit date: 04/11/1979  . Smokeless tobacco: Never Used  . Alcohol Use: Yes     Comment: once per week    Review of Systems  Constitutional: Negative for fever.  HENT: Negative for drooling and rhinorrhea.   Eyes: Positive for discharge. Negative for pain.  Respiratory: Negative for cough and shortness of breath.   Cardiovascular: Negative for chest pain and leg swelling.  Gastrointestinal: Negative for nausea, vomiting, abdominal pain and diarrhea.   Genitourinary: Negative for dysuria and hematuria.  Musculoskeletal: Negative for gait problem and neck pain.  Skin: Negative for color change.  Neurological: Positive for weakness (generalized). Negative for numbness and headaches.  Hematological: Negative for adenopathy.  Psychiatric/Behavioral: Negative for behavioral problems.  All other systems reviewed and are negative.     Allergies  Shellfish allergy  Home Medications   Prior to Admission medications   Medication Sig Start Date End Date Taking? Authorizing Provider  anagrelide (AGRYLIN) 0.5 MG capsule Take 3 tablets (1.5mg ) oral daily Patient taking differently: 2 mg. Take 4 tablets (2.0mg ) oral daily 11/11/13   Curt Bears, MD  aspirin 81 MG tablet Take 81 mg by mouth daily.      Historical Provider, MD  B Complex-C-Folic Acid (DIALYVITE PO) Take 1 tablet by mouth daily.    Historical Provider, MD  benzonatate (TESSALON) 100 MG capsule Take 1 capsule (100 mg total) by mouth 2 (two) times daily as needed for cough. 05/04/14   Lucretia Kern, DO  calcium acetate (PHOSLO) 667 MG capsule Take 667 mg by mouth 4 (four) times daily -  with meals and at bedtime.     Historical Provider, MD  colchicine 0.6 MG tablet Take 0.6 mg by mouth 2 (two) times daily. For gout 09/29/13   Historical Provider, MD  diclofenac sodium (VOLTAREN) 1 % GEL Apply 2 g topically 4 (four) times daily. 04/21/13  Ricard Dillon, MD  febuxostat (ULORIC) 40 MG tablet Take 40 mg by mouth daily.     Historical Provider, MD  lidocaine (LIDODERM) 5 % Place 3 patches onto the skin daily. Place in areas of greatest pain  Up to three patches a day  Remove & Discard patches within 12 hours 04/21/13   Ricard Dillon, MD  midodrine (PROAMATINE) 10 MG tablet Take 10 mg by mouth 3 (three) times daily.    Historical Provider, MD  neomycin-bacitracin-polymyxin (NEOSPORIN) ointment Apply 1 application topically every 12 (twelve) hours. Apply to spot on back twice daily and keep  covered.    Historical Provider, MD  omeprazole (PRILOSEC) 40 MG capsule Take 40 mg by mouth daily.    Historical Provider, MD  oxyCODONE (OXY IR/ROXICODONE) 5 MG immediate release tablet Take half tab every day at 8 am.  Take 1 tab 4 pm - Monday, Wednesday, Friday, Sunday.  Take 1 tab as needed at 4 pm  Tuesday, Thursday, Saturday.  Take half tab every evening at 8 pm as needed. Fill 06/28/14 04/29/14   Marin Olp, MD  oxyCODONE (ROXICODONE) 5 MG immediate release tablet Take half tab every day at 8 am.  Take 1 tab 4 pm - Monday, Wednesday, Friday, Sunday.  Take 1 tab as needed Tuesday, Thursday, Saturday.  Take half tab every evening at 4 pm as needed. Fill 06/28/14 04/29/14   Marin Olp, MD  pregabalin (LYRICA) 50 MG capsule Take 2 capsules (100 mg total) by mouth at bedtime. 03/13/14   Marin Olp, MD  Sennosides (SENOKOT PO) Take by mouth. Take 1 tablet daily as needed    Historical Provider, MD  vitamin C (ASCORBIC ACID) 250 MG tablet Take 250 mg by mouth daily.    Historical Provider, MD   BP 112/65 mmHg  Pulse 88  Temp(Src) 98.1 F (36.7 C) (Oral)  Resp 21  Wt 128 lb (58.06 kg)  SpO2 100% Physical Exam  Constitutional: He appears well-developed and well-nourished.  HENT:  Head: Normocephalic and atraumatic.  Right Ear: External ear normal.  Left Ear: External ear normal.  Nose: Nose normal.  Mouth/Throat: Oropharynx is clear and moist. No oropharyngeal exudate.  Eyes: Conjunctivae and EOM are normal. Pupils are equal, round, and reactive to light.  Neck: Normal range of motion. Neck supple.  Cardiovascular: Normal rate, regular rhythm, normal heart sounds and intact distal pulses.  Exam reveals no gallop and no friction rub.   No murmur heard. Pulmonary/Chest: Effort normal and breath sounds normal. No respiratory distress. He has no wheezes.  Abdominal: Soft. Bowel sounds are normal. He exhibits no distension. There is no tenderness. There is no rebound and no  guarding.  Musculoskeletal: Normal range of motion. He exhibits edema (mild pitting edema in upper and lower extremities.). He exhibits no tenderness.  Mild size stage 2 to 3 sacral ulcer noted.  Neurological: He is alert.  Alert and oriented 2. The patient thinks the years 2015.  Normal strength and sensation in all extremities.  Skin: Skin is warm and dry.  Multiple chronic appearing ulcers on the right anterior shin.  A few small chronic appearing ulcers on the left lower posterior calf.  Psychiatric: He has a normal mood and affect. His behavior is normal.  Nursing note and vitals reviewed.   ED Course  Procedures (including critical care time) Labs Review Labs Reviewed  CBC WITH DIFFERENTIAL/PLATELET - Abnormal; Notable for the following:    WBC 19.9 (*)  Hemoglobin 12.7 (*)    RDW 17.5 (*)    Neutrophils Relative % 79 (*)    Neutro Abs 15.7 (*)    Monocytes Absolute 1.8 (*)    All other components within normal limits  COMPREHENSIVE METABOLIC PANEL - Abnormal; Notable for the following:    Chloride 92 (*)    CO2 33 (*)    Glucose, Bld 142 (*)    Creatinine, Ser 4.25 (*)    Total Protein 5.9 (*)    Albumin 2.5 (*)    AST 40 (*)    Alkaline Phosphatase 121 (*)    GFR calc non Af Amer 11 (*)    GFR calc Af Amer 13 (*)    All other components within normal limits  TROPONIN I - Abnormal; Notable for the following:    Troponin I 0.06 (*)    All other components within normal limits  BRAIN NATRIURETIC PEPTIDE - Abnormal; Notable for the following:    B Natriuretic Peptide 897.7 (*)    All other components within normal limits  PROTIME-INR    Imaging Review Dg Chest 2 View  05/06/2014   CLINICAL DATA:  Altered mental status. Mid chest pain. History of pulmonary hypertension, atrial fibrillation, thrombocytopenia, hypertension, renal insufficiency.  EXAM: CHEST  2 VIEW  COMPARISON:  03/18/2010  FINDINGS: Shallow inspiration. Cardiac enlargement with diffuse pulmonary  vascular congestion. Hazy perihilar infiltrates consistent with mild interstitial edema. Small bilateral pleural effusions with basilar atelectasis, greater on the left. Findings have progressed since previous study. Probable underlying emphysematous change. No pneumothorax. Calcification of the aorta. Mild thoracic kyphosis with degenerative changes in the thoracic spine. Compression of an upper lumbar vertebra, probably L1. This is new since the previous study of 2011 but appears chronic.  IMPRESSION: Cardiac enlargement with pulmonary vascular congestion and perihilar edema. Small bilateral pleural effusions with basilar atelectasis, greater on the left.   Electronically Signed   By: Lucienne Capers M.D.   On: 05/06/2014 21:48   Ct Head Wo Contrast  05/06/2014   CLINICAL DATA:  Worsening lethargy, with decreased appetite. Initial encounter.  EXAM: CT HEAD WITHOUT CONTRAST  TECHNIQUE: Contiguous axial images were obtained from the base of the skull through the vertex without intravenous contrast.  COMPARISON:  None.  FINDINGS: There is no evidence of acute infarction, mass lesion, or intra- or extra-axial hemorrhage on CT.  Prominence of the ventricles and sulci reflects mild to moderate cortical volume loss. Cerebellar atrophy is noted. Mild periventricular white matter change likely reflects small vessel ischemic microangiopathy.  The brainstem and fourth ventricle are within normal limits. The basal ganglia are unremarkable in appearance. The cerebral hemispheres demonstrate grossly normal gray-white differentiation. No mass effect or midline shift is seen.  There is no evidence of fracture; visualized osseous structures are unremarkable in appearance. The visualized portions of the orbits are within normal limits. The paranasal sinuses and mastoid air cells are well-aerated. No significant soft tissue abnormalities are seen.  IMPRESSION: 1. No acute intracranial pathology seen on CT. 2. Mild to moderate  cortical volume loss and scattered small vessel ischemic microangiopathy.   Electronically Signed   By: Garald Balding M.D.   On: 05/06/2014 22:12     EKG Interpretation   Date/Time:  Wednesday May 06 2014 20:21:55 EST Ventricular Rate:  90 PR Interval:    QRS Duration: 137 QT Interval:  377 QTC Calculation: 461 R Axis:   129 Text Interpretation:  Atrial fibrillation Right bundle branch block  Confirmed by Aline Brochure  MD, [place] [phone]) on 05/06/2014 8:25:00 PM      MDM   Final diagnoses:  Altered mental status  FTT (failure to thrive) in adult  Leukocytosis    8:50 PM 81 y.o. male w hx of afib, pulm htn, anemia, ESRD on HD (TRS) who presents with worsening confusion over the last few days and generalized weakness. The family states he is usually able to ambulate with a walker but has not been able to do so over the last few days. They believe he has had a mild URI with bilateral conjunctivitis, sore throat, mild cough. They deny any fevers, vomiting, or diarrhea. He is afebrile and vital signs are unremarkable here. He is alert and oriented 2. He thought the year was 96. He has normal strength and sensation in all extremities. We'll get screening labs and imaging.  Will admit to hospitalist.   Pamella Pert, MD 05/07/14 [phone]

## 2014-05-06 NOTE — ED Notes (Signed)
PT: 17.4  INR: 1.47

## 2014-05-06 NOTE — ED Notes (Signed)
Per EMS - pt coming from [facility]. Per family, pt has been more lethargic with decreased appetite over last 3-4 days. Pt alert and able to follow commands. EKG shows right bundle branch block and Afib. Pt has no complaints. 20G Right Wrist, dialysis fistula in left arm. Pt hands edematous bilaterally (not normal for pt). 98% on 3L, BP hypotensive initially then last bp 129/77, pulse around 100. Pt has pressure sore on right side of back.

## 2014-05-07 ENCOUNTER — Encounter (HOSPITAL_COMMUNITY): Payer: Self-pay | Admitting: Internal Medicine

## 2014-05-07 DIAGNOSIS — R4182 Altered mental status, unspecified: Secondary | ICD-10-CM | POA: Insufficient documentation

## 2014-05-07 DIAGNOSIS — R627 Adult failure to thrive: Principal | ICD-10-CM

## 2014-05-07 DIAGNOSIS — F419 Anxiety disorder, unspecified: Secondary | ICD-10-CM | POA: Diagnosis present

## 2014-05-07 DIAGNOSIS — D649 Anemia, unspecified: Secondary | ICD-10-CM | POA: Diagnosis present

## 2014-05-07 DIAGNOSIS — R531 Weakness: Secondary | ICD-10-CM | POA: Diagnosis not present

## 2014-05-07 DIAGNOSIS — Z66 Do not resuscitate: Secondary | ICD-10-CM | POA: Diagnosis present

## 2014-05-07 DIAGNOSIS — Z87891 Personal history of nicotine dependence: Secondary | ICD-10-CM | POA: Diagnosis not present

## 2014-05-07 DIAGNOSIS — N186 End stage renal disease: Secondary | ICD-10-CM | POA: Diagnosis not present

## 2014-05-07 DIAGNOSIS — Z515 Encounter for palliative care: Secondary | ICD-10-CM | POA: Diagnosis not present

## 2014-05-07 DIAGNOSIS — I4891 Unspecified atrial fibrillation: Secondary | ICD-10-CM | POA: Diagnosis not present

## 2014-05-07 DIAGNOSIS — E876 Hypokalemia: Secondary | ICD-10-CM | POA: Diagnosis present

## 2014-05-07 DIAGNOSIS — Z7982 Long term (current) use of aspirin: Secondary | ICD-10-CM | POA: Diagnosis not present

## 2014-05-07 DIAGNOSIS — J90 Pleural effusion, not elsewhere classified: Secondary | ICD-10-CM | POA: Diagnosis present

## 2014-05-07 DIAGNOSIS — H109 Unspecified conjunctivitis: Secondary | ICD-10-CM | POA: Diagnosis present

## 2014-05-07 DIAGNOSIS — I272 Other secondary pulmonary hypertension: Secondary | ICD-10-CM | POA: Diagnosis present

## 2014-05-07 DIAGNOSIS — D72829 Elevated white blood cell count, unspecified: Secondary | ICD-10-CM | POA: Diagnosis not present

## 2014-05-07 DIAGNOSIS — I120 Hypertensive chronic kidney disease with stage 5 chronic kidney disease or end stage renal disease: Secondary | ICD-10-CM | POA: Diagnosis present

## 2014-05-07 DIAGNOSIS — Z91013 Allergy to seafood: Secondary | ICD-10-CM | POA: Diagnosis not present

## 2014-05-07 DIAGNOSIS — D473 Essential (hemorrhagic) thrombocythemia: Secondary | ICD-10-CM | POA: Diagnosis present

## 2014-05-07 DIAGNOSIS — B0229 Other postherpetic nervous system involvement: Secondary | ICD-10-CM | POA: Diagnosis present

## 2014-05-07 DIAGNOSIS — Z7901 Long term (current) use of anticoagulants: Secondary | ICD-10-CM | POA: Diagnosis not present

## 2014-05-07 DIAGNOSIS — N2581 Secondary hyperparathyroidism of renal origin: Secondary | ICD-10-CM | POA: Diagnosis present

## 2014-05-07 DIAGNOSIS — E875 Hyperkalemia: Secondary | ICD-10-CM | POA: Diagnosis not present

## 2014-05-07 DIAGNOSIS — Z9841 Cataract extraction status, right eye: Secondary | ICD-10-CM | POA: Diagnosis not present

## 2014-05-07 DIAGNOSIS — Z992 Dependence on renal dialysis: Secondary | ICD-10-CM | POA: Diagnosis not present

## 2014-05-07 DIAGNOSIS — N289 Disorder of kidney and ureter, unspecified: Secondary | ICD-10-CM | POA: Diagnosis present

## 2014-05-07 DIAGNOSIS — R630 Anorexia: Secondary | ICD-10-CM | POA: Diagnosis not present

## 2014-05-07 DIAGNOSIS — M1990 Unspecified osteoarthritis, unspecified site: Secondary | ICD-10-CM | POA: Diagnosis present

## 2014-05-07 DIAGNOSIS — I480 Paroxysmal atrial fibrillation: Secondary | ICD-10-CM | POA: Diagnosis not present

## 2014-05-07 DIAGNOSIS — L0390 Cellulitis, unspecified: Secondary | ICD-10-CM | POA: Diagnosis present

## 2014-05-07 DIAGNOSIS — Z9842 Cataract extraction status, left eye: Secondary | ICD-10-CM | POA: Diagnosis not present

## 2014-05-07 LAB — TROPONIN I
TROPONIN I: 0.05 ng/mL — AB (ref <0.031)
Troponin I: 0.09 ng/mL — ABNORMAL HIGH (ref <0.031)
Troponin I: 0.04 ng/mL — ABNORMAL HIGH

## 2014-05-07 LAB — CBC
HCT: 40.5 % (ref 39.0–52.0)
Hemoglobin: 12.5 g/dL — ABNORMAL LOW (ref 13.0–17.0)
MCH: 28.8 pg (ref 26.0–34.0)
MCHC: 30.9 g/dL (ref 30.0–36.0)
MCV: 93.3 fL (ref 78.0–100.0)
Platelets: 188 K/uL (ref 150–400)
RBC: 4.34 MIL/uL (ref 4.22–5.81)
RDW: 17.4 % — ABNORMAL HIGH (ref 11.5–15.5)
WBC: 15.9 K/uL — ABNORMAL HIGH (ref 4.0–10.5)

## 2014-05-07 LAB — GLUCOSE, CAPILLARY
Glucose-Capillary: 74 mg/dL (ref 70–99)
Glucose-Capillary: 72 mg/dL (ref 70–99)

## 2014-05-07 LAB — TSH: TSH: 5.588 u[IU]/mL — ABNORMAL HIGH (ref 0.350–4.500)

## 2014-05-07 LAB — SEDIMENTATION RATE: Sed Rate: 30 mm/h — ABNORMAL HIGH (ref 0–16)

## 2014-05-07 MED ORDER — SODIUM CHLORIDE 0.9 % IV SOLN
125.0000 mg | INTRAVENOUS | Status: DC
  Administered 2014-05-07: 125 mg via INTRAVENOUS
  Filled 2014-05-07 (×2): qty 10

## 2014-05-07 MED ORDER — HEPARIN SODIUM (PORCINE) 5000 UNIT/ML IJ SOLN
5000.0000 [IU] | Freq: Three times a day (TID) | INTRAMUSCULAR | Status: DC
  Administered 2014-05-07 – 2014-05-11 (×11): 5000 [IU] via SUBCUTANEOUS
  Filled 2014-05-07 (×20): qty 1

## 2014-05-07 MED ORDER — SODIUM CHLORIDE 0.9 % IV SOLN
1250.0000 mg | Freq: Once | INTRAVENOUS | Status: AC
  Administered 2014-05-07: 1250 mg via INTRAVENOUS
  Filled 2014-05-07: qty 1250

## 2014-05-07 MED ORDER — ACETAMINOPHEN 650 MG RE SUPP: 650.0000 mg | Freq: Four times a day (QID) | RECTAL | Status: DC | PRN

## 2014-05-07 MED ORDER — HEPARIN SODIUM (PORCINE) 1000 UNIT/ML DIALYSIS: 1000.0000 [IU] | INTRAMUSCULAR | Status: DC | PRN

## 2014-05-07 MED ORDER — ACETAMINOPHEN 325 MG PO TABS
650.0000 mg | ORAL_TABLET | Freq: Four times a day (QID) | ORAL | Status: DC | PRN
  Administered 2014-05-09 – 2014-05-10 (×2): 650 mg via ORAL
  Filled 2014-05-07 (×3): qty 2

## 2014-05-07 MED ORDER — ASPIRIN EC 81 MG PO TBEC
81.0000 mg | DELAYED_RELEASE_TABLET | Freq: Every day | ORAL | Status: DC
  Administered 2014-05-10: 81 mg via ORAL
  Filled 2014-05-07 (×5): qty 1

## 2014-05-07 MED ORDER — MIDODRINE HCL 5 MG PO TABS
10.0000 mg | ORAL_TABLET | Freq: Three times a day (TID) | ORAL | Status: DC
  Administered 2014-05-08 – 2014-05-10 (×6): 10 mg via ORAL
  Filled 2014-05-07 (×16): qty 2

## 2014-05-07 MED ORDER — ANAGRELIDE HCL 0.5 MG PO CAPS
2.0000 mg | ORAL_CAPSULE | Freq: Every day | ORAL | Status: DC
  Administered 2014-05-10: 2 mg via ORAL
  Filled 2014-05-07 (×5): qty 4

## 2014-05-07 MED ORDER — VANCOMYCIN HCL 500 MG IV SOLR
500.0000 mg | INTRAVENOUS | Status: DC
  Administered 2014-05-07 – 2014-05-09 (×2): 500 mg via INTRAVENOUS
  Filled 2014-05-07 (×5): qty 500

## 2014-05-07 MED ORDER — SALINE SPRAY 0.65 % NA SOLN: 1.0000 | NASAL | Status: DC | PRN

## 2014-05-07 MED ORDER — HEPARIN SODIUM (PORCINE) 1000 UNIT/ML DIALYSIS: 3000.0000 [IU] | Freq: Once | INTRAMUSCULAR | Status: DC

## 2014-05-07 MED ORDER — SODIUM CHLORIDE 0.9 % IJ SOLN
3.0000 mL | Freq: Two times a day (BID) | INTRAMUSCULAR | Status: DC
  Administered 2014-05-07: 10 mL via INTRAVENOUS
  Administered 2014-05-07 – 2014-05-12 (×8): 3 mL via INTRAVENOUS

## 2014-05-07 MED ORDER — ERYTHROMYCIN 5 MG/GM OP OINT
TOPICAL_OINTMENT | Freq: Four times a day (QID) | OPHTHALMIC | Status: DC
  Administered 2014-05-07 (×2): via OPHTHALMIC
  Administered 2014-05-07: 1 via OPHTHALMIC
  Administered 2014-05-07 – 2014-05-09 (×9): via OPHTHALMIC
  Administered 2014-05-10 (×2): 1 via OPHTHALMIC
  Administered 2014-05-10 – 2014-05-12 (×7): via OPHTHALMIC
  Filled 2014-05-07 (×3): qty 3.5

## 2014-05-07 MED ORDER — PREGABALIN 100 MG PO CAPS
100.0000 mg | ORAL_CAPSULE | Freq: Every day | ORAL | Status: DC
  Administered 2014-05-08 – 2014-05-10 (×3): 100 mg via ORAL
  Filled 2014-05-07 (×3): qty 1

## 2014-05-07 MED ORDER — SODIUM CHLORIDE 0.9 % IV SOLN: 100.0000 mL | INTRAVENOUS | Status: DC | PRN

## 2014-05-07 MED ORDER — PENTAFLUOROPROP-TETRAFLUOROETH EX AERO: 1.0000 "application " | INHALATION_SPRAY | CUTANEOUS | Status: DC | PRN

## 2014-05-07 MED ORDER — FEBUXOSTAT 40 MG PO TABS
40.0000 mg | ORAL_TABLET | Freq: Every day | ORAL | Status: DC
  Administered 2014-05-09 – 2014-05-10 (×2): 40 mg via ORAL
  Filled 2014-05-07 (×5): qty 1

## 2014-05-07 MED ORDER — NEPRO/CARBSTEADY PO LIQD
237.0000 mL | ORAL | Status: DC | PRN
  Filled 2014-05-07: qty 237

## 2014-05-07 MED ORDER — LIDOCAINE-PRILOCAINE 2.5-2.5 % EX CREA
1.0000 "application " | TOPICAL_CREAM | CUTANEOUS | Status: DC | PRN
  Filled 2014-05-07: qty 5

## 2014-05-07 MED ORDER — ALTEPLASE 2 MG IJ SOLR
2.0000 mg | Freq: Once | INTRAMUSCULAR | Status: DC | PRN
  Filled 2014-05-07: qty 2

## 2014-05-07 MED ORDER — PIPERACILLIN-TAZOBACTAM IN DEX 2-0.25 GM/50ML IV SOLN
2.2500 g | Freq: Once | INTRAVENOUS | Status: AC
  Administered 2014-05-07: 2.25 g via INTRAVENOUS
  Filled 2014-05-07: qty 50

## 2014-05-07 MED ORDER — CETYLPYRIDINIUM CHLORIDE 0.05 % MT LIQD
7.0000 mL | Freq: Two times a day (BID) | OROMUCOSAL | Status: DC
  Administered 2014-05-07 – 2014-05-10 (×7): 7 mL via OROMUCOSAL

## 2014-05-07 MED ORDER — PANTOPRAZOLE SODIUM 40 MG PO TBEC
40.0000 mg | DELAYED_RELEASE_TABLET | Freq: Every day | ORAL | Status: DC
  Administered 2014-05-10: 40 mg via ORAL
  Filled 2014-05-07: qty 1

## 2014-05-07 MED ORDER — CALCIUM ACETATE 667 MG PO CAPS
1334.0000 mg | ORAL_CAPSULE | Freq: Three times a day (TID) | ORAL | Status: DC
  Administered 2014-05-08 (×2): 1334 mg via ORAL
  Filled 2014-05-07 (×13): qty 2

## 2014-05-07 MED ORDER — NEPHRO-VITE 0.8 MG PO TABS
1.0000 | ORAL_TABLET | Freq: Every day | ORAL | Status: DC
  Administered 2014-05-07 – 2014-05-10 (×4): 1 via ORAL
  Filled 2014-05-07 (×6): qty 1

## 2014-05-07 MED ORDER — CHLORHEXIDINE GLUCONATE 0.12 % MT SOLN
15.0000 mL | Freq: Two times a day (BID) | OROMUCOSAL | Status: DC
  Administered 2014-05-07 – 2014-05-10 (×6): 15 mL via OROMUCOSAL
  Filled 2014-05-07 (×11): qty 15

## 2014-05-07 MED ORDER — LIDOCAINE HCL (PF) 1 % IJ SOLN: 5.0000 mL | INTRAMUSCULAR | Status: DC | PRN

## 2014-05-07 MED ORDER — DOCUSATE SODIUM 100 MG PO CAPS
100.0000 mg | ORAL_CAPSULE | Freq: Every day | ORAL | Status: DC
  Administered 2014-05-10: 100 mg via ORAL
  Filled 2014-05-07 (×5): qty 1

## 2014-05-07 MED ORDER — SENNA 8.6 MG PO TABS
8.6000 mg | ORAL_TABLET | Freq: Every evening | ORAL | Status: DC | PRN
  Filled 2014-05-07: qty 1

## 2014-05-07 MED ORDER — PIPERACILLIN-TAZOBACTAM IN DEX 2-0.25 GM/50ML IV SOLN
2.2500 g | Freq: Three times a day (TID) | INTRAVENOUS | Status: DC
  Administered 2014-05-07 – 2014-05-11 (×11): 2.25 g via INTRAVENOUS
  Filled 2014-05-07 (×17): qty 50

## 2014-05-07 NOTE — Progress Notes (Addendum)
[facility]                                                                              Patient Demographics  Ryan Arellano, is a 81 y.o. male, DOB - 02/17/1922, GYB:[id number]  Admit date - 05/06/2014   Admitting Physician Jani Gravel, MD  Outpatient Primary MD for the patient is Garret Reddish, MD  LOS - 1   Chief Complaint  Patient presents with  . Fatigue      HPI on 05/07/2014 by Dr. Jani Gravel 81 yo male with Afib, ESRD on HD (T, T, S), Anemia apparently has FTT, Has not been eating well over the past 2 weeks, and gradually getting weaker. Per family denies fever, chills, cp, palp, sob, abd pain, diarrhea, brbpr, black stool. Slight cough, Dry. Pt has been more lethargic over the past 48 hours, and this prompted his family to bring him in. Pt will be admitted for FTT.  Assessment & Plan   Altered mental status/Failure to thrive -CT head: No acute intracranial pathology -TSH, B12, Folate, RPR, HIV, ANA pending -ESR 30 -Will consult nutrition  Skin ulcerations/Cellulitis/postherpetic neuralgia -Continue Vanc and Zosyn -Wound care consulted -Patient recently seen by Dr. Yong Channel 04/13/2014 in the office for postherpetic neuralgia and cellulitis- was treated with doxycycline  Leukocytosis  -Possible secondary to above -Blood cultures pending -CXR: Cardiac enlargement with pulmonary vascular congestion, bilateral small pleural effusions -Patient afebrile -Cannot obtain UA due to ESRD -Continue to monitor CBC  Thrombocythemia -Followed by Dr. Julien Nordmann -Contiue Agrylin  Elevated troponin -Peak trop 0.09, now 0.05 -Unlikely accurate marker of ACS in ESRD patient -Echo pending  Mildly Elevated LFTs -Possibly secondary to FTT -Will continue to monitor CMP  ESRD -Dialyzes on Tuesday, Thursday, Saturday -Nephrology consult appreciated  Small bilateral pleural effusions -Seen on CXR -Should resolve or improve with HD  Conjunctivitis -Continue  erythromycin ophthalmic ointment  CODE STATUS/Goals of care -Spoke with daughters via phone, CODE STATUS has been changed to DO NOT RESUSCITATE. We'll also consult palliative care  Code Status: DO NOT RESUSCITATE   Family Communication: Daughters via phone   Disposition Plan: Admitted   Time Spent in minutes   30  minutes  Procedures  None  Consults   Nephrology Palliative care   DVT Prophylaxis  Heparin  Lab Results  Component Value Date   PLT PENDING 05/07/2014    Medications  Scheduled Meds: . anagrelide  2 mg Oral Daily  . antiseptic oral rinse  7 mL Mouth Rinse q12n4p  . aspirin EC  81 mg Oral Daily  . b complex-vitamin c-folic acid  1 tablet Oral QHS  . calcium acetate  1,334 mg Oral TID WC & HS  . chlorhexidine  15 mL Mouth Rinse BID  . docusate sodium  100 mg Oral Daily  . erythromycin   Both Eyes 4 times per day  . febuxostat  40 mg Oral Daily  . heparin  5,000 Units Subcutaneous 3 times per day  . midodrine  10 mg Oral TID WC  . pantoprazole  40 mg Oral Daily  . piperacillin-tazobactam (ZOSYN)  IV  2.25 g Intravenous 3 times per day  . pregabalin  100  mg Oral QHS  . sodium chloride  3 mL Intravenous Q12H  . vancomycin  500 mg Intravenous Q T,Th,Sa-HD   Continuous Infusions:  PRN Meds:.acetaminophen **OR** acetaminophen, senna, sodium chloride  Antibiotics    Anti-infectives    Start     Dose/Rate Route Frequency Ordered Stop   05/07/14 1200  vancomycin (VANCOCIN) 500 mg in sodium chloride 0.9 % 100 mL IVPB     500 mg100 mL/hr over 60 Minutes Intravenous Every T-Th-Sa (Hemodialysis) 05/07/14 0209     05/07/14 0600  piperacillin-tazobactam (ZOSYN) IVPB 2.25 g     2.25 g100 mL/hr over 30 Minutes Intravenous 3 times per day 05/07/14 0207     05/07/14 0215  piperacillin-tazobactam (ZOSYN) IVPB 2.25 g     2.25 g100 mL/hr over 30 Minutes Intravenous  Once 05/07/14 0209 05/07/14 0424   05/07/14 0215  vancomycin (VANCOCIN) 1,250 mg in sodium chloride 0.9 %  250 mL IVPB     1,250 mg166.7 mL/hr over 90 Minutes Intravenous  Once 05/07/14 0209 05/07/14 0534        Subjective:   Ryan Arellano seen and examined today.  Patient able to answer some questions, but falls back to sleep quickly.  Denies chest pain or SOB.  Grimaces with palpation of the abdomen.  Objective:   Filed Vitals:   05/07/14 0100 05/07/14 0155 05/07/14 0551 05/07/14 0734  BP: 98/64 114/62 80/45 95/50   Pulse: 80 76 65 66  Temp:  97.8 F (36.6 C) 97.6 F (36.4 C)   TempSrc:  Oral Oral   Resp: 27 16 16    Height:  5' 5"  (1.651 m)    Weight:  57.7 kg (127 lb 3.3 oz) 57.7 kg (127 lb 3.3 oz)   SpO2: 98% 97% 86% 92%    Wt Readings from Last 3 Encounters:  05/07/14 57.7 kg (127 lb 3.3 oz)  04/15/14 58.06 kg (128 lb)  03/27/14 58.06 kg (128 lb)     Intake/Output Summary (Last 24 hours) at 05/07/14 1123 Last data filed at 05/07/14 0925  Gross per 24 hour  Intake      0 ml  Output      0 ml  Net      0 ml    Exam  General: Well developed, NAD, appears stated age  HEENT: NCAT, mucous membranes moist.   Cardiovascular: S1 S2 auscultated, no rubs, murmurs or gallops.   Respiratory: Diminished, cannot fully evaluate as patient cannot inhale deeply  Abdomen: Soft, TTP,  nondistended, + bowel sounds  Extremities: warm dry without cyanosis clubbing or edema  Neuro: AAOx2, no able to fully assess due to somnolence  Skin: lower extremities wrapped  Data Review   Micro Results No results found for this or any previous visit (from the past 240 hour(s)).  Radiology Reports Dg Chest 2 View  05/06/2014   CLINICAL DATA:  Altered mental status. Mid chest pain. History of pulmonary hypertension, atrial fibrillation, thrombocytopenia, hypertension, renal insufficiency.  EXAM: CHEST  2 VIEW  COMPARISON:  03/18/2010  FINDINGS: Shallow inspiration. Cardiac enlargement with diffuse pulmonary vascular congestion. Hazy perihilar infiltrates consistent with mild interstitial  edema. Small bilateral pleural effusions with basilar atelectasis, greater on the left. Findings have progressed since previous study. Probable underlying emphysematous change. No pneumothorax. Calcification of the aorta. Mild thoracic kyphosis with degenerative changes in the thoracic spine. Compression of an upper lumbar vertebra, probably L1. This is new since the previous study of 2011 but appears chronic.  IMPRESSION: Cardiac enlargement with  pulmonary vascular congestion and perihilar edema. Small bilateral pleural effusions with basilar atelectasis, greater on the left.   Electronically Signed   By: Lucienne Capers M.D.   On: 05/06/2014 21:48   Ct Head Wo Contrast  05/06/2014   CLINICAL DATA:  Worsening lethargy, with decreased appetite. Initial encounter.  EXAM: CT HEAD WITHOUT CONTRAST  TECHNIQUE: Contiguous axial images were obtained from the base of the skull through the vertex without intravenous contrast.  COMPARISON:  None.  FINDINGS: There is no evidence of acute infarction, mass lesion, or intra- or extra-axial hemorrhage on CT.  Prominence of the ventricles and sulci reflects mild to moderate cortical volume loss. Cerebellar atrophy is noted. Mild periventricular white matter change likely reflects small vessel ischemic microangiopathy.  The brainstem and fourth ventricle are within normal limits. The basal ganglia are unremarkable in appearance. The cerebral hemispheres demonstrate grossly normal gray-white differentiation. No mass effect or midline shift is seen.  There is no evidence of fracture; visualized osseous structures are unremarkable in appearance. The visualized portions of the orbits are within normal limits. The paranasal sinuses and mastoid air cells are well-aerated. No significant soft tissue abnormalities are seen.  IMPRESSION: 1. No acute intracranial pathology seen on CT. 2. Mild to moderate cortical volume loss and scattered small vessel ischemic microangiopathy.    Electronically Signed   By: Garald Balding M.D.   On: 05/06/2014 22:12    CBC  Recent Labs Lab 05/06/14 2053 05/07/14 0955  WBC 19.9* PENDING  HGB 12.7* 12.5*  HCT 41.8 40.5  PLT 220 PENDING  MCV 94.1 93.3  MCH 28.6 28.8  MCHC 30.4 30.9  RDW 17.5* 17.4*  LYMPHSABS 2.4  --   MONOABS 1.8*  --   EOSABS 0.0  --   BASOSABS 0.0  --     Chemistries   Recent Labs Lab 05/06/14 2053  NA 136  K 3.6  CL 92*  CO2 33*  GLUCOSE 142*  BUN 16  CREATININE 4.25*  CALCIUM 8.7  AST 40*  ALT 15  ALKPHOS 121*  BILITOT 0.9   ------------------------------------------------------------------------------------------------------------------ estimated creatinine clearance is 9.4 mL/min (by C-G formula based on Cr of 4.25). ------------------------------------------------------------------------------------------------------------------ No results for input(s): HGBA1C in the last 72 hours. ------------------------------------------------------------------------------------------------------------------ No results for input(s): CHOL, HDL, LDLCALC, TRIG, CHOLHDL, LDLDIRECT in the last 72 hours. ------------------------------------------------------------------------------------------------------------------  Recent Labs  05/07/14 0336  TSH 5.588*   ------------------------------------------------------------------------------------------------------------------ No results for input(s): VITAMINB12, FOLATE, FERRITIN, TIBC, IRON, RETICCTPCT in the last 72 hours.  Coagulation profile No results for input(s): INR, PROTIME in the last 168 hours.  No results for input(s): DDIMER in the last 72 hours.  Cardiac Enzymes  Recent Labs Lab 05/06/14 2053 05/07/14 0350 05/07/14 0955  TROPONINI 0.06* 0.09* 0.05*   ------------------------------------------------------------------------------------------------------------------ Invalid input(s): POCBNP    Aaditya Letizia D.O. on  05/07/2014 at 11:23 AM  Between 7am to 7pm - Pager - [phone]  After 7pm go to [email or web address] - password [email or web address]  And look for the night coverage person covering for me after hours  [facility] Office  [phone]

## 2014-05-07 NOTE — Consult Note (Signed)
Renal Service Consult Note [facility]  Ryan Arellano 05/07/2014 [name] D Requesting Physician:  Dr Ree Kida  Reason for Consult:  ESRD pt with FTT at SNF HPI: The patient is a 81 y.o. year-old with FTT sent from SNF for 2 week decline w lethargy, not eating, tired, and weak. Usually in [facility] at HD unit.  Patient poor historian, none other available at this time.   No CP, SOB , chills or fever per pt  Past Medical History  Past Medical History  Diagnosis Date  . Pulmonary hypertension   . Atrial fibrillation   . Thrombocytopenia   . Anemia   . Arthritis   . Diverticulitis   . Hypertension   . Renal insufficiency   . Skin lesion of back   . DIVERTICULITIS, HX OF 02/21/2010         Past Surgical History  Past Surgical History  Procedure Laterality Date  . Ruptured colon  1980  . S/p colectomy  1980  . Tonsillectomy    . Av fistula placement  2002    left arm  . Cataract extraction Bilateral    Family History  Family History  Problem Relation Age of Onset  . Heart disease Father   . Hypertension Father   . Heart disease Mother    Social History  reports that he quit smoking about 35 years ago. His smoking use included Cigarettes. He has a 20 pack-year smoking history. He has never used smokeless tobacco. He reports that he drinks alcohol. He reports that he does not use illicit drugs. Allergies  Allergies  Allergen Reactions  . Shellfish Allergy Other (See Comments)    Causes gout symptoms to flare up   Home medications Prior to Admission medications   Medication Sig Start Date End Date Taking? Authorizing Provider  acetaminophen (TYLENOL) 500 MG tablet Take 500 mg by mouth 2 (two) times daily as needed for moderate pain (for pain between Lyrica doses).   Yes Historical Provider, MD  anagrelide (AGRYLIN) 0.5 MG capsule Take 3 tablets (1.5mg ) oral daily Patient taking differently: 2 mg. Take 4 tablets (2.0mg ) oral daily 11/11/13  Yes Curt Bears, MD  aspirin 81 MG tablet Take 81 mg by mouth daily.     Yes Historical Provider, MD  B Complex-C-Folic Acid (DIALYVITE PO) Take 1 tablet by mouth daily.   Yes Historical Provider, MD  calcium acetate (PHOSLO) 667 MG capsule Take 1,334 mg by mouth 4 (four) times daily -  with meals and at bedtime.    Yes Historical Provider, MD  diclofenac sodium (VOLTAREN) 1 % GEL Apply 2 g topically 4 (four) times daily. 04/21/13  Yes Ricard Dillon, MD  docusate sodium (COLACE) 100 MG capsule Take 100 mg by mouth daily.   Yes Historical Provider, MD  febuxostat (ULORIC) 40 MG tablet Take 40 mg by mouth daily.    Yes Historical Provider, MD  lidocaine (LIDODERM) 5 % Place 3 patches onto the skin daily. Place in areas of greatest pain  Up to three patches a day  Remove & Discard patches within 12 hours 04/21/13  Yes Ricard Dillon, MD  midodrine (PROAMATINE) 10 MG tablet Take 10 mg by mouth 3 (three) times daily.   Yes Historical Provider, MD  neomycin-bacitracin-polymyxin (NEOSPORIN) ointment Apply 1 application topically every 12 (twelve) hours. Apply to spot on back twice daily and keep covered.   Yes Historical Provider, MD  omeprazole (PRILOSEC) 40 MG capsule Take 40 mg by mouth  daily.   Yes Historical Provider, MD  oxyCODONE (OXY IR/ROXICODONE) 5 MG immediate release tablet Take half tab every day at 8 am.  Take 1 tab 4 pm - Monday, Wednesday, Friday, Sunday.  Take 1 tab as needed at 4 pm  Tuesday, Thursday, Saturday.  Take half tab every evening at 8 pm as needed. Fill 06/28/14 04/29/14  Yes Marin Olp, MD  pregabalin (LYRICA) 50 MG capsule Take 2 capsules (100 mg total) by mouth at bedtime. 03/13/14  Yes Marin Olp, MD  sodium chloride (OCEAN) 0.65 % SOLN nasal spray Place 1-2 sprays into both nostrils as needed for congestion.   Yes Historical Provider, MD  benzonatate (TESSALON) 100 MG capsule Take 1 capsule (100 mg total) by mouth 2 (two) times daily as needed for cough. 05/04/14   Lucretia Kern, DO  colchicine 0.6 MG tablet Take 0.6 mg by mouth 2 (two) times daily as needed. For gout 09/29/13   Historical Provider, MD  oxyCODONE (ROXICODONE) 5 MG immediate release tablet Take half tab every day at 8 am.  Take 1 tab 4 pm - Monday, Wednesday, Friday, Sunday.  Take 1 tab as needed Tuesday, Thursday, Saturday.  Take half tab every evening at 4 pm as needed. Fill 06/28/14 Patient not taking: Reported on 05/07/2014 04/29/14   Marin Olp, MD  Sennosides (SENOKOT PO) Take by mouth. Take 1 tablet daily as needed    Historical Provider, MD   Liver Function Tests  Recent Labs Lab 05/06/14 2053  AST 40*  ALT 15  ALKPHOS 121*  BILITOT 0.9  PROT 5.9*  ALBUMIN 2.5*   No results for input(s): LIPASE, AMYLASE in the last 168 hours. CBC  Recent Labs Lab 05/06/14 2053 05/07/14 0955  WBC 19.9* 15.9*  NEUTROABS 15.7*  --   HGB 12.7* 12.5*  HCT 41.8 40.5  MCV 94.1 93.3  PLT 220 778   Basic Metabolic Panel  Recent Labs Lab 05/06/14 2053  NA 136  K 3.6  CL 92*  CO2 33*  GLUCOSE 142*  BUN 16  CREATININE 4.25*  CALCIUM 8.7    Filed Vitals:   05/07/14 0100 05/07/14 0155 05/07/14 0551 05/07/14 0734  BP: 98/64 114/62 80/45 95/50   Pulse: 80 76 65 66  Temp:  97.8 F (36.6 C) 97.6 F (36.4 C)   TempSrc:  Oral Oral   Resp: 27 16 16    Height:  5\' 5"  (1.651 m)    Weight:  57.7 kg (127 lb 3.3 oz) 57.7 kg (127 lb 3.3 oz)   SpO2: 98% 97% 86% 92%   Exam Chronically ill-appearing elderly male, not in distress, very weak No rash, cyanosis or gangrene Sclera anicteric, throat clear +JVD to angle of jaw Swollen face, hands Chest bibasilar coarse rales RRR no MRG Abd soft, NTND, +BS, no ascites +mild-mod bilat pitting edema of LE's L arm AVF patent Neuro gen weakness, cachectic  HD: TTS NW 4h   2/2.25 Bath   Dry wt?   Heparin 3000   LUA AVF Hectorol 1 ug (will hold as Ca up)   Venofer 100/hd thru 2/4   Assessment: 1. FTT / anorexia / gen weakness - at SNF over last  2 weeks, lethargic 2. ESRD on HD 3. Vol overload - JVD, rales, LE edema , prob losing body wt 4. AFib 5. HTN 6. AMS - not sure of baseline, comes to OP HD in [facility] and keeps to himself per report 7. DNR   Plan-  HD today, max UF, lower drywt, have discussions with family about QOL while pt is here, consider option of discontinuing HD  Kelly Splinter MD (pgr) [phone]    ([phone] 05/07/2014, 1:13 PM

## 2014-05-07 NOTE — H&P (Signed)
Ryan Arellano is an 81 y.o. male.     Geologist, engineering (nephrology)  Chief Complaint: FTT HPI: 81 yo male with Afib, ESRD on HD (T, T, S), Anemia apparently has FTT,  Has not been eating well over the past 2 weeks, and gradually getting weaker. Per family denies fever, chills, cp, palp, sob, abd pain, diarrhea, brbpr, black stool.   Slight cough,  Dry.  Pt has been more lethargic over the past 48 hours, and this prompted his family to bring him in.  Pt will be admitted for FTT.      Past Medical History  Diagnosis Date  . Pulmonary hypertension   . Atrial fibrillation   . Thrombocytopenia   . Anemia   . Arthritis   . Diverticulitis   . Hypertension   . Renal insufficiency   . Skin lesion of back   . DIVERTICULITIS, HX OF 02/21/2010          Past Surgical History  Procedure Laterality Date  . Ruptured colon  1980  . S/p colectomy  1980  . Tonsillectomy    . Av fistula placement  2002    left arm  . Cataract extraction Bilateral     Family History  Problem Relation Age of Onset  . Heart disease Father   . Hypertension Father   . Heart disease Mother    Social History:  reports that he quit smoking about 35 years ago. His smoking use included Cigarettes. He has a 20 pack-year smoking history. He has never used smokeless tobacco. He reports that he drinks alcohol. He reports that he does not use illicit drugs.  Allergies:  Allergies  Allergen Reactions  . Shellfish Allergy Other (See Comments)    Causes gout symptoms to flare up     (Not in a hospital admission)  Results for orders placed or performed during the hospital encounter of 05/06/14 (from the past 48 hour(s))  CBC with Differential/Platelet     Status: Abnormal   Collection Time: 05/06/14  8:53 PM  Result Value Ref Range   WBC 19.9 (H) 4.0 - 10.5 K/uL    Comment: WHITE COUNT CONFIRMED ON SMEAR   RBC 4.44 4.22 - 5.81 MIL/uL   Hemoglobin 12.7 (L) 13.0 - 17.0 g/dL   HCT 41.8 39.0 - 52.0 %   MCV 94.1 78.0  - 100.0 fL   MCH 28.6 26.0 - 34.0 pg   MCHC 30.4 30.0 - 36.0 g/dL   RDW 17.5 (H) 11.5 - 15.5 %   Platelets 220 150 - 400 K/uL    Comment: SPECIMEN CHECKED FOR CLOTS REPEATED TO VERIFY PLATELET COUNT CONFIRMED BY SMEAR    Neutrophils Relative % 79 (H) 43 - 77 %   Lymphocytes Relative 12 12 - 46 %   Monocytes Relative 9 3 - 12 %   Eosinophils Relative 0 0 - 5 %   Basophils Relative 0 0 - 1 %   Band Neutrophils 0 0 - 10 %   Metamyelocytes Relative 0 %   Myelocytes 0 %   Promyelocytes Absolute 0 %   Blasts 0 %   nRBC 0 0 /100 WBC   Neutro Abs 15.7 (H) 1.7 - 7.7 K/uL   Lymphs Abs 2.4 0.7 - 4.0 K/uL   Monocytes Absolute 1.8 (H) 0.1 - 1.0 K/uL   Eosinophils Absolute 0.0 0.0 - 0.7 K/uL   Basophils Absolute 0.0 0.0 - 0.1 K/uL   RBC Morphology POLYCHROMASIA PRESENT    WBC Morphology  TOXIC GRANULATION    Smear Review LARGE PLATELETS PRESENT   Comprehensive metabolic panel     Status: Abnormal   Collection Time: 05/06/14  8:53 PM  Result Value Ref Range   Sodium 136 135 - 145 mmol/L   Potassium 3.6 3.5 - 5.1 mmol/L   Chloride 92 (L) 96 - 112 mmol/L   CO2 33 (H) 19 - 32 mmol/L   Glucose, Bld 142 (H) 70 - 99 mg/dL   BUN 16 6 - 23 mg/dL   Creatinine, Ser 4.25 (H) 0.50 - 1.35 mg/dL   Calcium 8.7 8.4 - 10.5 mg/dL   Total Protein 5.9 (L) 6.0 - 8.3 g/dL   Albumin 2.5 (L) 3.5 - 5.2 g/dL   AST 40 (H) 0 - 37 U/L   ALT 15 0 - 53 U/L   Alkaline Phosphatase 121 (H) 39 - 117 U/L   Total Bilirubin 0.9 0.3 - 1.2 mg/dL   GFR calc non Af Amer 11 (L) >90 mL/min   GFR calc Af Amer 13 (L) >90 mL/min    Comment: (NOTE) The eGFR has been calculated using the CKD EPI equation. This calculation has not been validated in all clinical situations. eGFR's persistently <90 mL/min signify possible Chronic Kidney Disease.    Anion gap 11 5 - 15  Troponin I     Status: Abnormal   Collection Time: 05/06/14  8:53 PM  Result Value Ref Range   Troponin I 0.06 (H) <0.031 ng/mL    Comment:         PERSISTENTLY INCREASED TROPONIN VALUES IN THE RANGE OF 0.04-0.49 ng/mL CAN BE SEEN IN:       -UNSTABLE ANGINA       -CONGESTIVE HEART FAILURE       -MYOCARDITIS       -CHEST TRAUMA       -ARRYHTHMIAS       -LATE PRESENTING MYOCARDIAL INFARCTION       -COPD   CLINICAL FOLLOW-UP RECOMMENDED.   Brain natriuretic peptide     Status: Abnormal   Collection Time: 05/06/14  8:53 PM  Result Value Ref Range   B Natriuretic Peptide 897.7 (H) 0.0 - 100.0 pg/mL   Dg Chest 2 View  05/06/2014   CLINICAL DATA:  Altered mental status. Mid chest pain. History of pulmonary hypertension, atrial fibrillation, thrombocytopenia, hypertension, renal insufficiency.  EXAM: CHEST  2 VIEW  COMPARISON:  03/18/2010  FINDINGS: Shallow inspiration. Cardiac enlargement with diffuse pulmonary vascular congestion. Hazy perihilar infiltrates consistent with mild interstitial edema. Small bilateral pleural effusions with basilar atelectasis, greater on the left. Findings have progressed since previous study. Probable underlying emphysematous change. No pneumothorax. Calcification of the aorta. Mild thoracic kyphosis with degenerative changes in the thoracic spine. Compression of an upper lumbar vertebra, probably L1. This is new since the previous study of 2011 but appears chronic.  IMPRESSION: Cardiac enlargement with pulmonary vascular congestion and perihilar edema. Small bilateral pleural effusions with basilar atelectasis, greater on the left.   Electronically Signed   By: Lucienne Capers M.D.   On: 05/06/2014 21:48   Ct Head Wo Contrast  05/06/2014   CLINICAL DATA:  Worsening lethargy, with decreased appetite. Initial encounter.  EXAM: CT HEAD WITHOUT CONTRAST  TECHNIQUE: Contiguous axial images were obtained from the base of the skull through the vertex without intravenous contrast.  COMPARISON:  None.  FINDINGS: There is no evidence of acute infarction, mass lesion, or intra- or extra-axial hemorrhage on CT.  Prominence  of the  ventricles and sulci reflects mild to moderate cortical volume loss. Cerebellar atrophy is noted. Mild periventricular white matter change likely reflects small vessel ischemic microangiopathy.  The brainstem and fourth ventricle are within normal limits. The basal ganglia are unremarkable in appearance. The cerebral hemispheres demonstrate grossly normal gray-white differentiation. No mass effect or midline shift is seen.  There is no evidence of fracture; visualized osseous structures are unremarkable in appearance. The visualized portions of the orbits are within normal limits. The paranasal sinuses and mastoid air cells are well-aerated. No significant soft tissue abnormalities are seen.  IMPRESSION: 1. No acute intracranial pathology seen on CT. 2. Mild to moderate cortical volume loss and scattered small vessel ischemic microangiopathy.   Electronically Signed   By: Garald Balding M.D.   On: 05/06/2014 22:12    Review of Systems  Constitutional: Negative for fever, chills, weight loss, malaise/fatigue and diaphoresis.  HENT: Negative for congestion, ear discharge, ear pain, hearing loss, nosebleeds, sore throat and tinnitus.   Eyes: Negative for blurred vision, double vision, photophobia, pain, discharge and redness.  Respiratory: Negative for cough, hemoptysis, sputum production, shortness of breath, wheezing and stridor.   Cardiovascular: Negative for chest pain, palpitations, orthopnea, claudication, leg swelling and PND.  Gastrointestinal: Negative for heartburn, nausea, vomiting, abdominal pain, diarrhea, constipation, blood in stool and melena.  Genitourinary: Negative for dysuria, urgency, frequency, hematuria and flank pain.  Musculoskeletal: Positive for back pain and joint pain. Negative for myalgias, falls and neck pain.  Skin: Negative for itching and rash.  Neurological: Negative for dizziness, tingling, tremors, sensory change, speech change, focal weakness, seizures, loss of  consciousness, weakness and headaches.  Endo/Heme/Allergies: Negative for environmental allergies and polydipsia. Does not bruise/bleed easily.  Psychiatric/Behavioral: Negative for depression, suicidal ideas, hallucinations, memory loss and substance abuse. The patient is not nervous/anxious and does not have insomnia.     Blood pressure 101/60, pulse 85, temperature 98.1 F (36.7 C), temperature source Oral, resp. rate 28, weight 58.06 kg (128 lb), SpO2 97 %. Physical Exam  Constitutional: He appears well-developed and well-nourished.  HENT:  Head: Normocephalic and atraumatic.  Eyes: Conjunctivae and EOM are normal. Pupils are equal, round, and reactive to light.  Neck: Normal range of motion. Neck supple. No JVD present. No tracheal deviation present. No thyromegaly present.  Cardiovascular: Normal rate and regular rhythm.  Exam reveals no gallop and no friction rub.   No murmur heard. Respiratory: Effort normal and breath sounds normal.  GI: Soft. Bowel sounds are normal. He exhibits no distension. There is no tenderness. There is no rebound and no guarding.  Musculoskeletal: Normal range of motion. He exhibits no edema or tenderness.  Lymphadenopathy:    He has no cervical adenopathy.  Neurological: He is alert. He displays normal reflexes. No cranial nerve deficit. He exhibits normal muscle tone. Coordination normal.  Skin: Skin is warm and dry.  Multiple skin ulcers on the right distal lower ext and also on the left distal lower ext, sites of excoriation  Psychiatric: He has a normal mood and affect. His behavior is normal. Judgment and thought content normal.     Assessment/Plan AMS Check cbc, cmp, tsh, b12, folate, esr, rpr ana  Skin ulcers with yellow drainage Start on vanco iv, and zosyn iv  Leukocytosis with toxic granulations This is a sign of infection typically, blood cultures x 2 sets.   Abnormal lft Check US abdomen  FTT Attempted to discuss code status and  end of life issues with family,  they still wish him full code at this time, please attempt to discuss with them further tomorrow.   ESRD on HD, T, T, S  Please contact nephrology in am  Conjunctivitis Erythromycin opthalmic ointment topically qid     Jani Gravel 05/07/2014, 12:28 AM

## 2014-05-07 NOTE — Progress Notes (Signed)
INITIAL NUTRITION ASSESSMENT  DOCUMENTATION CODES Per approved criteria  -Not Applicable   INTERVENTION: -When diet advances recommend Nepro BID providing 425 kcals and 19.1 g protein -RD to continue to monitor  NUTRITION DIAGNOSIS: Inadequate oral intake related to poor appetite as evidenced by 0% PO intake.   Goal: Pt to meet >/= 90% of needs with meals and supplements  Monitor:  Pt PO intake, weight trends, labs  Reason for Assessment: Low Braden  81 y.o. male  Admitting Dx: <principal problem not specified>  ASSESSMENT: Pt admitted from SNF for FTT.  Family reports he has not eaten well in 2 weeks and he has become more lethargic over the past 48 hours PTA.  Pt hx of ESRD on HD, anemia, FTT, HTN.  Palliative Care consulted.    Pt not responsive.  Spoke with daughter.  She has not noticed weight loss and only noticed loss of appetite in past two weeks which is why she brought him in.  Pt PO intake 0%.  Daughter says his dialysis center sends him home with Ensure or Nepro (she is not sure which one it is) but he doesn't like them very much.  She said he may try Nepro and wished to have them ordered BID incase he would drink them. He is currently NPO.  Nutrition Focused Physical Exam:  Subcutaneous Fat:  Orbital Region: well nourished Upper Arm Region: well nourished Thoracic and Lumbar Region: well nourished  Muscle:  Temple Region: moderate depletion Clavicle Bone Region: well nourished Clavicle and Acromion Bone Region: well nourished Scapular Bone Region: n/a Dorsal Hand: n/a Patellar Region: moderate depletion Anterior Thigh Region: n/a Posterior Calf Region: n/a  Edema: n/a    Height: Ht Readings from Last 1 Encounters:  05/07/14 5\' 5"  (1.651 m)    Weight: Wt Readings from Last 1 Encounters:  05/07/14 127 lb 3.3 oz (57.7 kg)    Ideal Body Weight: 136  % Ideal Body Weight: 93%  Wt Readings from Last 10 Encounters:  05/07/14 127 lb 3.3 oz (57.7  kg)  04/15/14 128 lb (58.06 kg)  03/27/14 128 lb (58.06 kg)  01/28/14 126 lb 14.4 oz (57.561 kg)  01/23/14 128 lb (58.06 kg)  01/14/14 128 lb (58.06 kg)  12/31/13 128 lb (58.06 kg)  10/27/13 125 lb 9.6 oz (56.972 kg)  10/06/13 123 lb (55.792 kg)  09/12/13 123 lb (55.792 kg)    Usual Body Weight: 125  % Usual Body Weight: 102%  BMI:  Body mass index is 21.17 kg/(m^2).  Estimated Nutritional Needs: Kcal: 8099-8338  Protein: 75-85 g  Fluid: > 1.7 L/day  Skin: Pressure ulcers on right shin and left calf  Diet Order:  NPO  EDUCATION NEEDS: -No education needs identified at this time   Intake/Output Summary (Last 24 hours) at 05/07/14 1444 Last data filed at 05/07/14 1409  Gross per 24 hour  Intake      0 ml  Output      0 ml  Net      0 ml    Last BM: PTA   Labs:   Recent Labs Lab 05/06/14 2053  NA 136  K 3.6  CL 92*  CO2 33*  BUN 16  CREATININE 4.25*  CALCIUM 8.7  GLUCOSE 142*    CBG (last 3)   Recent Labs  05/07/14 1218  GLUCAP 74    Scheduled Meds: . anagrelide  2 mg Oral Daily  . antiseptic oral rinse  7 mL Mouth Rinse q12n4p  .  aspirin EC  81 mg Oral Daily  . b complex-vitamin c-folic acid  1 tablet Oral QHS  . calcium acetate  1,334 mg Oral TID WC & HS  . chlorhexidine  15 mL Mouth Rinse BID  . docusate sodium  100 mg Oral Daily  . erythromycin   Both Eyes 4 times per day  . febuxostat  40 mg Oral Daily  . ferric gluconate (FERRLECIT/NULECIT) IV  125 mg Intravenous Q T,Th,Sa-HD  . heparin  5,000 Units Subcutaneous 3 times per day  . midodrine  10 mg Oral TID WC  . pantoprazole  40 mg Oral Daily  . piperacillin-tazobactam (ZOSYN)  IV  2.25 g Intravenous 3 times per day  . pregabalin  100 mg Oral QHS  . sodium chloride  3 mL Intravenous Q12H  . vancomycin  500 mg Intravenous Q T,Th,Sa-HD    Continuous Infusions:   Past Medical History  Diagnosis Date  . Pulmonary hypertension   . Atrial fibrillation   . Thrombocytopenia   .  Anemia   . Arthritis   . Diverticulitis   . Hypertension   . Renal insufficiency   . Skin lesion of back   . DIVERTICULITIS, HX OF 02/21/2010          Past Surgical History  Procedure Laterality Date  . Ruptured colon  1980  . S/p colectomy  1980  . Tonsillectomy    . Av fistula placement  2002    left arm  . Cataract extraction Bilateral     Elmer Picker MS Dietetic Intern Pager Number [phone]  Intern note/chart reviewed. Revisions made.  [place], [place], [place] Pager [phone] After Hours Pager

## 2014-05-07 NOTE — Progress Notes (Signed)
ANTIBIOTIC CONSULT NOTE - INITIAL  Pharmacy Consult for vancomycin and zosyn  Indication: wound infection   Allergies  Allergen Reactions  . Shellfish Allergy Other (See Comments)    Causes gout symptoms to flare up    Patient Measurements: Height: 5\' 5"  (165.1 cm) Weight: 127 lb 3.3 oz (57.7 kg) IBW/kg (Calculated) : 61.5 Adjusted Body Weight:   Vital Signs: Temp: 97.8 F (36.6 C) (01/28 0155) Temp Source: Oral (01/28 0155) BP: 114/62 mmHg (01/28 0155) Pulse Rate: 76 (01/28 0155) Intake/Output from previous day:   Intake/Output from this shift:    Labs:  Recent Labs  05/06/14 2053  WBC 19.9*  HGB 12.7*  PLT 220  CREATININE 4.25*   Estimated Creatinine Clearance: 9.4 mL/min (by C-G formula based on Cr of 4.25). No results for input(s): VANCOTROUGH, VANCOPEAK, VANCORANDOM, GENTTROUGH, GENTPEAK, GENTRANDOM, TOBRATROUGH, TOBRAPEAK, TOBRARND, AMIKACINPEAK, AMIKACINTROU, AMIKACIN in the last 72 hours.   Microbiology: No results found for this or any previous visit (from the past 720 hour(s)).  Medical History: Past Medical History  Diagnosis Date  . Pulmonary hypertension   . Atrial fibrillation   . Thrombocytopenia   . Anemia   . Arthritis   . Diverticulitis   . Hypertension   . Renal insufficiency   . Skin lesion of back   . DIVERTICULITIS, HX OF 02/21/2010          Medications:  Prescriptions prior to admission  Medication Sig Dispense Refill Last Dose  . acetaminophen (TYLENOL) 500 MG tablet Take 500 mg by mouth 2 (two) times daily as needed for moderate pain (for pain between Lyrica doses).   04/13/2014  . anagrelide (AGRYLIN) 0.5 MG capsule Take 3 tablets (1.5mg ) oral daily (Patient taking differently: 2 mg. Take 4 tablets (2.0mg ) oral daily) 120 capsule 1 05/06/2014 at Unknown time  . aspirin 81 MG tablet Take 81 mg by mouth daily.     05/06/2014 at Unknown time  . B Complex-C-Folic Acid (DIALYVITE PO) Take 1 tablet by mouth daily.   05/06/2014 at  Unknown time  . calcium acetate (PHOSLO) 667 MG capsule Take 1,334 mg by mouth 4 (four) times daily -  with meals and at bedtime.    05/06/2014 at Unknown time  . diclofenac sodium (VOLTAREN) 1 % GEL Apply 2 g topically 4 (four) times daily. 100 g 11 05/06/2014 at Unknown time  . docusate sodium (COLACE) 100 MG capsule Take 100 mg by mouth daily.   05/06/2014 at Unknown time  . febuxostat (ULORIC) 40 MG tablet Take 40 mg by mouth daily.    05/06/2014 at Unknown time  . lidocaine (LIDODERM) 5 % Place 3 patches onto the skin daily. Place in areas of greatest pain  Up to three patches a day  Remove & Discard patches within 12 hours 90 patch 3 05/06/2014 at Unknown time  . midodrine (PROAMATINE) 10 MG tablet Take 10 mg by mouth 3 (three) times daily.   05/06/2014 at Unknown time  . neomycin-bacitracin-polymyxin (NEOSPORIN) ointment Apply 1 application topically every 12 (twelve) hours. Apply to spot on back twice daily and keep covered.   05/06/2014 at Unknown time  . omeprazole (PRILOSEC) 40 MG capsule Take 40 mg by mouth daily.   05/06/2014 at Unknown time  . oxyCODONE (OXY IR/ROXICODONE) 5 MG immediate release tablet Take half tab every day at 8 am.  Take 1 tab 4 pm - Monday, Wednesday, Friday, Sunday.  Take 1 tab as needed at 4 pm  Tuesday, Thursday, Saturday.  Take half tab every evening at 8 pm as needed. Fill 06/28/14 60 tablet 0 05/06/2014 at Unknown time  . pregabalin (LYRICA) 50 MG capsule Take 2 capsules (100 mg total) by mouth at bedtime. 60 capsule 5 05/06/2014 at Unknown time  . sodium chloride (OCEAN) 0.65 % SOLN nasal spray Place 1-2 sprays into both nostrils as needed for congestion.   05/05/2014 at Unknown time  . benzonatate (TESSALON) 100 MG capsule Take 1 capsule (100 mg total) by mouth 2 (two) times daily as needed for cough. 20 capsule 0 unknown  . colchicine 0.6 MG tablet Take 0.6 mg by mouth 2 (two) times daily as needed. For gout   unknown  . oxyCODONE (ROXICODONE) 5 MG immediate release  tablet Take half tab every day at 8 am.  Take 1 tab 4 pm - Monday, Wednesday, Friday, Sunday.  Take 1 tab as needed Tuesday, Thursday, Saturday.  Take half tab every evening at 4 pm as needed. Fill 06/28/14 (Patient not taking: Reported on 05/07/2014) 60 tablet 0 Not Taking at Unknown time  . Sennosides (SENOKOT PO) Take by mouth. Take 1 tablet daily as needed   04/16/2014   Assessment: 81 yo HD pt TTS with wound infection. HD schedule in house should remain tts. vanc and zosyn for coverage.   Goal of Therapy:  Pre HD vanc of ~85mcg/ml   Plan:  Zosyn 2.25 gm q8h  Vancomycin 1250x1 then 500 mg after each HD  Connye Burkitt 05/07/2014,2:05 AM

## 2014-05-07 NOTE — Progress Notes (Signed)
MD notified due to acute change in patient status. Vital signs and labs are listed below.  MD notified(1st page) Time of 1st page:  Schorr, NP  MD response: No new orders. Pt is on IV antibotics  Vital Signs Filed Vitals:   05/07/14 0000 05/07/14 0030 05/07/14 0100 05/07/14 0155  BP: 101/60 92/48 98/64  114/62  Pulse: 85 69 80 76  Temp:    97.8 F (36.6 C)  TempSrc:    Oral  Resp: 28 17 27 16   Height:    5\' 5"  (1.651 m)  Weight:    57.7 kg (127 lb 3.3 oz)  SpO2: 97% 94% 98% 97%     Lab Results WBC  Date/Time Value Ref Range Status  05/06/2014 08:53 PM 19.9* 4.0 - 10.5 K/uL Final    Comment:    WHITE COUNT CONFIRMED ON SMEAR  11/13/2013 10:10 PM 20.7* 4.0 - 10.5 K/uL Final  06/02/2013 02:58 PM 15.2* 4.0 - 10.3 10e3/uL Final  07/22/2012 12:46 PM 9.0 4.0 - 10.3 10e3/uL Final  06/05/2012 12:14 PM 8.2 4.0 - 10.3 10e3/uL Final  11/04/2010 02:07 PM 9.3 4.5 - 10.5 K/uL Final   NEUT%  Date/Time Value Ref Range Status  06/02/2013 02:58 PM 72.6 39.0 - 75.0 % Final  07/22/2012 12:46 PM 76.9* 39.0 - 75.0 % Final  06/05/2012 12:14 PM 74.9 39.0 - 75.0 % Final   NEUTROPHILS RELATIVE %  Date/Time Value Ref Range Status  05/06/2014 08:53 PM 79* 43 - 77 % Final  11/04/2010 02:07 PM 78.9* 43.0 - 77.0 % Final  02/21/2010 04:00 PM 82.3* 43.0-77.0 % Final   No results found for: PCO2ART No results found for: LATICACIDVEN No results found for: Leana Gamer, RN 05/07/2014, 2:04 AM

## 2014-05-08 DIAGNOSIS — D72829 Elevated white blood cell count, unspecified: Secondary | ICD-10-CM

## 2014-05-08 DIAGNOSIS — R531 Weakness: Secondary | ICD-10-CM

## 2014-05-08 DIAGNOSIS — Z515 Encounter for palliative care: Secondary | ICD-10-CM

## 2014-05-08 DIAGNOSIS — R630 Anorexia: Secondary | ICD-10-CM | POA: Diagnosis present

## 2014-05-08 LAB — VITAMIN B12: VITAMIN B 12: 1656 pg/mL — AB (ref 211–911)

## 2014-05-08 LAB — COMPREHENSIVE METABOLIC PANEL
ALBUMIN: 2.3 g/dL — AB (ref 3.5–5.2)
ALT: 15 U/L (ref 0–53)
ANION GAP: 15 (ref 5–15)
AST: 34 U/L (ref 0–37)
Alkaline Phosphatase: 115 U/L (ref 39–117)
BILIRUBIN TOTAL: 1.1 mg/dL (ref 0.3–1.2)
BUN: 13 mg/dL (ref 6–23)
CO2: 26 mmol/L (ref 19–32)
Calcium: 8.2 mg/dL — ABNORMAL LOW (ref 8.4–10.5)
Chloride: 100 mmol/L (ref 96–112)
Creatinine, Ser: 3.74 mg/dL — ABNORMAL HIGH (ref 0.50–1.35)
GFR, EST AFRICAN AMERICAN: 15 mL/min — AB (ref >90)
GFR, EST NON AFRICAN AMERICAN: 13 mL/min — AB (ref >90)
GLUCOSE: 73 mg/dL (ref 70–99)
POTASSIUM: 4.8 mmol/L (ref 3.5–5.1)
Sodium: 141 mmol/L (ref 135–145)
Total Protein: 5.7 g/dL — ABNORMAL LOW (ref 6.0–8.3)

## 2014-05-08 LAB — FOLATE RBC
Folate, Hemolysate: >620 ng/mL
Folate, RBC: >1512 ng/mL (ref >498)
Hematocrit: 41 % (ref 37.5–51.0)

## 2014-05-08 LAB — GLUCOSE, CAPILLARY
GLUCOSE-CAPILLARY: 83 mg/dL (ref 70–99)
GLUCOSE-CAPILLARY: 106 mg/dL — AB (ref 70–99)
Glucose-Capillary: 76 mg/dL (ref 70–99)
Glucose-Capillary: 82 mg/dL (ref 70–99)
Glucose-Capillary: 80 mg/dL (ref 70–99)

## 2014-05-08 LAB — CBC
HCT: 43 % (ref 39.0–52.0)
Hemoglobin: 13.6 g/dL (ref 13.0–17.0)
MCH: 29.8 pg (ref 26.0–34.0)
MCHC: 31.6 g/dL (ref 30.0–36.0)
MCV: 94.1 fL (ref 78.0–100.0)
PLATELETS: 164 10*3/uL (ref 150–400)
RBC: 4.57 MIL/uL (ref 4.22–5.81)
RDW: 17.8 % — ABNORMAL HIGH (ref 11.5–15.5)
WBC: 18.2 10*3/uL — ABNORMAL HIGH (ref 4.0–10.5)

## 2014-05-08 LAB — HIV ANTIBODY (ROUTINE TESTING W REFLEX): HIV SCREEN 4TH GENERATION: NONREACTIVE

## 2014-05-08 LAB — RPR: RPR: UNDETERMINED

## 2014-05-08 LAB — ANA: ANA: NEGATIVE

## 2014-05-08 MED ORDER — SENNOSIDES-DOCUSATE SODIUM 8.6-50 MG PO TABS: 2.0000 | ORAL_TABLET | Freq: Every evening | ORAL | Status: DC | PRN

## 2014-05-08 MED ORDER — MAGIC MOUTHWASH W/LIDOCAINE
15.0000 mL | Freq: Three times a day (TID) | ORAL | Status: DC
  Administered 2014-05-08 – 2014-05-10 (×7): 15 mL via ORAL
  Filled 2014-05-08 (×14): qty 15

## 2014-05-08 MED ORDER — RESOURCE THICKENUP CLEAR PO POWD
ORAL | Status: DC | PRN
  Filled 2014-05-08: qty 125

## 2014-05-08 MED ORDER — MORPHINE SULFATE 2 MG/ML IJ SOLN
2.0000 mg | INTRAMUSCULAR | Status: DC | PRN
  Administered 2014-05-08 – 2014-05-10 (×4): 2 mg via INTRAVENOUS
  Filled 2014-05-08 (×5): qty 1

## 2014-05-08 MED ORDER — SORBITOL 70 % SOLN
30.0000 mL | Freq: Once | Status: AC
  Administered 2014-05-08: 30 mL via ORAL
  Filled 2014-05-08: qty 30

## 2014-05-08 MED ORDER — BISACODYL 10 MG RE SUPP: 10.0000 mg | Freq: Every day | RECTAL | Status: DC | PRN

## 2014-05-08 MED ORDER — NEPRO/CARBSTEADY PO LIQD
237.0000 mL | Freq: Three times a day (TID) | ORAL | Status: DC
  Administered 2014-05-08 – 2014-05-10 (×5): 237 mL via ORAL

## 2014-05-08 NOTE — Progress Notes (Signed)
Paged MD to notify that lab was unable to get RPR sample. MD is okay with that since he is at his baseline.

## 2014-05-08 NOTE — Consult Note (Signed)
[name]      DOB: 10-12-1921      STM:[id number]     Consult Note from the Palliative Medicine Team at [place] Requested by: Dr. Ree Kida     PCP: Ryan Reddish, MD Reason for Consultation: [place]     Phone Number:[phone]  Assessment of patients Current state: I met today with Ryan Arellano and his 3 children. They are happy as he is much more awake and alert today. We discussed his overall decline that has been many changes over the past 1-2 weeks but they are also realizing changes and decline gradually over the past few months. We discussed goals of care and how he is very social and enjoys participating in many activities at his ALF and he used to enjoy gambling. They say more recently he sits outside when the weather allows and watches television. We discussed considering quality of life with all decisions they may be faced with including dialysis. We discussed that we hope to be doing aggressive measures like dialysis when we are gaining QOL and not just quantity. We want to make sure that what we are asking him to go through is truly beneficial to him. Ryan Arellano has a particularly difficult time contemplating the idea of stopping dialysis but her brother seems to understand and even admits that he is scared that this will be a continued decline for his father from her out. We discussed watchful waiting over the next days with consideration/preparation of the decisions they may be faced with. They are not ready for comfort measures now and wish to continue therapies. I will follow up on Monday.    Goals of Care: 1.  Code Status: DNR   2. Scope of Treatment: Continue dialysis, antibiotics, IVF, support therapy as indicated.    4. Disposition: To be determined on outcomes.    3. Symptom Management:   1. Constipation: Sorbitol x 1. Continue colace scheduled and consider Senokot-S 2 tabs qhs scheduled if continued constipation (this is prn for now). Dulcolax  supp daily prn for more severe constipation.  2. Oral Thrush: Magic mouthwash with lidocaine TID.  3. Decreased Appetite: Encourage small frequent meals. SLP following. Treating oral thrush.  4. Weakness: Continue current therapies. PT/OT  4. Psychosocial: Emotional support provided to patient and family at bedside during difficult conversation.    Brief HPI: 81 yo male admitted with failure to thrive with weakening, AMS, and decreased appetite worsening over past 2 weeks. He is admitted and evaluation of AMS r/o infectious cause and continued dialysis. Recently treated as outpt for cellulitis in bilat lower legs on 1/4. Mental status improved today but will monitor carefully over weekend and reevaluate Monday for further decline or improvement. PMH reviewed below.    ROS: + constipation, + decreased appetite, + pain when swallowing, + weakness    PMH:  Past Medical History  Diagnosis Date  . Pulmonary hypertension   . Atrial fibrillation   . Thrombocytopenia   . Anemia   . Arthritis   . Diverticulitis   . Hypertension   . Renal insufficiency   . Skin lesion of back   . DIVERTICULITIS, HX OF 02/21/2010           PSH: Past Surgical History  Procedure Laterality Date  . Ruptured colon  1980  . S/p colectomy  1980  . Tonsillectomy    . Av fistula placement  2002    left arm  . Cataract extraction Bilateral  I have reviewed the [place] and [facility] and  If appropriate update it with new information. Allergies  Allergen Reactions  . Shellfish Allergy Other (See Comments)    Causes gout symptoms to flare up   Scheduled Meds: . anagrelide  2 mg Oral Daily  . antiseptic oral rinse  7 mL Mouth Rinse q12n4p  . aspirin EC  81 mg Oral Daily  . b complex-vitamin c-folic acid  1 tablet Oral QHS  . calcium acetate  1,334 mg Oral TID WC & HS  . chlorhexidine  15 mL Mouth Rinse BID  . docusate sodium  100 mg Oral Daily  . erythromycin   Both Eyes 4 times per day  . febuxostat  40 mg  Oral Daily  . heparin  5,000 Units Subcutaneous 3 times per day  . midodrine  10 mg Oral TID WC  . pantoprazole  40 mg Oral Daily  . piperacillin-tazobactam (ZOSYN)  IV  2.25 g Intravenous 3 times per day  . pregabalin  100 mg Oral QHS  . sodium chloride  3 mL Intravenous Q12H  . vancomycin  500 mg Intravenous Q T,Th,Sa-HD   Continuous Infusions:  PRN Meds:.acetaminophen **OR** acetaminophen, morphine injection, senna, sodium chloride    BP 97/65 mmHg  Pulse 106  Temp(Src) 97.8 F (36.6 C) (Oral)  Resp 16  Ht 5' 5"  (1.651 m)  Wt 58.7 kg (129 lb 6.6 oz)  BMI 21.53 kg/m2  SpO2 100%   PPS: 30% at best   Intake/Output Summary (Last 24 hours) at 05/08/14 1404 Last data filed at 05/08/14 1000  Gross per 24 hour  Intake     50 ml  Output   1595 ml  Net  -1545 ml   LBM: PTA  Physical Exam:  General: NAD, lying in bed, elderly, frail HEENT: Temporal muscle wasting, moist mucous membranes, white coating to back of mouth and tongue Chest: Diminished throughout, no labored breathing, symmetric CVS: RRR, S1 S2 Abdomen: Soft, NT, slightly distended, + BS Ext: MAE, no edema, BLE wrapped in dressing CDI Neuro: Awake, alert, oriented to person/place  Labs: CBC    Component Value Date/Time   WBC 18.2* 05/08/2014 0622   WBC 15.2* 06/02/2013 1458   RBC 4.57 05/08/2014 0622   RBC 4.06* 06/02/2013 1458   HGB 13.6 05/08/2014 0622   HGB 11.7* 06/02/2013 1458   HCT 43.0 05/08/2014 0622   HCT 37.1* 06/02/2013 1458   PLT 164 05/08/2014 0622   PLT 281 06/02/2013 1458   MCV 94.1 05/08/2014 0622   MCV 91.2 06/02/2013 1458   MCH 29.8 05/08/2014 0622   MCH 28.9 06/02/2013 1458   MCHC 31.6 05/08/2014 0622   MCHC 31.7* 06/02/2013 1458   RDW 17.8* 05/08/2014 0622   RDW 15.7* 06/02/2013 1458   LYMPHSABS 2.4 05/06/2014 2053   LYMPHSABS 2.5 06/02/2013 1458   MONOABS 1.8* 05/06/2014 2053   MONOABS 1.3* 06/02/2013 1458   EOSABS 0.0 05/06/2014 2053   EOSABS 0.1 06/02/2013 1458    BASOSABS 0.0 05/06/2014 2053   BASOSABS 0.2* 06/02/2013 1458    BMET    Component Value Date/Time   NA 141 05/08/2014 0622   NA 141 06/02/2013 1458   K 4.8 05/08/2014 0622   K 4.5 06/02/2013 1458   CL 100 05/08/2014 0622   CL 106 07/22/2012 1246   CO2 26 05/08/2014 0622   CO2 21* 06/02/2013 1458   GLUCOSE 73 05/08/2014 0622   GLUCOSE 89 06/02/2013 1458   GLUCOSE 142* 07/22/2012  1246   BUN 13 05/08/2014 0622   BUN 93.6* 06/02/2013 1458   CREATININE 3.74* 05/08/2014 0622   CREATININE 6.5* 06/02/2013 1458   CALCIUM 8.2* 05/08/2014 0622   CALCIUM 9.9 06/02/2013 1458   CALCIUM 9.0 02/15/2012 1258   GFRNONAA 13* 05/08/2014 0622   GFRAA 15* 05/08/2014 0622    CMP     Component Value Date/Time   NA 141 05/08/2014 0622   NA 141 06/02/2013 1458   K 4.8 05/08/2014 0622   K 4.5 06/02/2013 1458   CL 100 05/08/2014 0622   CL 106 07/22/2012 1246   CO2 26 05/08/2014 0622   CO2 21* 06/02/2013 1458   GLUCOSE 73 05/08/2014 0622   GLUCOSE 89 06/02/2013 1458   GLUCOSE 142* 07/22/2012 1246   BUN 13 05/08/2014 0622   BUN 93.6* 06/02/2013 1458   CREATININE 3.74* 05/08/2014 0622   CREATININE 6.5* 06/02/2013 1458   CALCIUM 8.2* 05/08/2014 0622   CALCIUM 9.9 06/02/2013 1458   CALCIUM 9.0 02/15/2012 1258   PROT 5.7* 05/08/2014 0622   PROT 7.9 06/02/2013 1458   ALBUMIN 2.3* 05/08/2014 0622   ALBUMIN 3.9 06/02/2013 1458   AST 34 05/08/2014 0622   AST 18 06/02/2013 1458   ALT 15 05/08/2014 0622   ALT 17 06/02/2013 1458   ALKPHOS 115 05/08/2014 0622   ALKPHOS 117 06/02/2013 1458   BILITOT 1.1 05/08/2014 0622   BILITOT 0.48 06/02/2013 1458   GFRNONAA 13* 05/08/2014 0622   GFRAA 15* 05/08/2014 0622     Time In Time Out Total Time Spent with Patient Total Overall Time  1420 1540 70mn 883m    Greater than 50%  of this time was spent counseling and coordinating care related to the above assessment and plan.  AlVinie SillNP Palliative Medicine Team Pager # [phone]-F  8a-5p) Team Phone # [phone]/Weekends)

## 2014-05-08 NOTE — Progress Notes (Signed)
CARE MANAGEMENT NOTE 05/08/2014  Patient:  Ryan Arellano, Ryan Arellano   Account Number:  [id number]  Date Initiated:  05/08/2014  Documentation initiated by:  Tomi Bamberger  Subjective/Objective Assessment:   dx ftt  admit- from home     Action/Plan:   Anticipated DC Date:  05/09/2014   Anticipated DC Plan:  [place] referral  Clinical Social Worker      DC Planning Services  CM consult      Choice offered to / List presented to:             Status of service:  In process, will continue to follow Medicare Important Message given?  YES (If response is "NO", the following Medicare IM given date fields will be blank) Date Medicare IM given:  05/08/2014 Medicare IM given by:  Tomi Bamberger Date Additional Medicare IM given:   Additional Medicare IM given by:    Discharge Disposition:    Per UR Regulation:    If discussed at Long Length of Stay Meetings, dates discussed:    Comments:  05/08/14 Bell Arthur RN,BSN [phone] patient from home NCM will cont to follow for dc needs.

## 2014-05-08 NOTE — Progress Notes (Signed)
[facility]                                                                              Patient Demographics  Ryan Arellano, is a 81 y.o. male, DOB - 14-Sep-1921, [id number]  Admit date - 05/06/2014   Admitting Physician Jani Gravel, MD  Outpatient Primary MD for the patient is Garret Reddish, MD  LOS - 2   Chief Complaint  Patient presents with  . Fatigue      HPI on 05/07/2014 by Dr. Jani Gravel 81 yo male with Afib, ESRD on HD (T, T, S), Anemia apparently has FTT, Has not been eating well over the past 2 weeks, and gradually getting weaker. Per family denies fever, chills, cp, palp, sob, abd pain, diarrhea, brbpr, black stool. Slight cough, Dry. Pt has been more lethargic over the past 48 hours, and this prompted his family to bring him in. Pt will be admitted for FTT.  Assessment & Plan   Altered mental status/Failure to thrive -CT head: No acute intracranial pathology -Patient appears to be at baseline -TSH 5.58, B12 1656, ANA neg, ESR 30  -Pending Folate , RPR -Nutrition and speech consulted  Skin ulcerations/Cellulitis/postherpetic neuralgia -Continue Vanc and Zosyn -Wound care consulted -Patient recently seen by Dr. Yong Channel 04/13/2014 in the office for postherpetic neuralgia and cellulitis- was treated with doxycycline  Leukocytosis, chronic  -Possible secondary to above; however patient has had elevated WBC since Feb 2015 -Blood cultures pending -CXR: Cardiac enlargement with pulmonary vascular congestion, bilateral small pleural effusions -Patient afebrile -Cannot obtain UA due to ESRD -Continue to monitor CBC  Thrombocythemia -Followed by Dr. Julien Nordmann -Contiue Agrylin  Elevated troponin -Peak trop 0.09, now 0.04 -Unlikely accurate marker of ACS in ESRD patient -Echo pending  Mildly Elevated LFTs -Resovled, Possibly secondary to FTT  ESRD -Dialyzes on Tuesday, Thursday, Saturday -Nephrology consult appreciated  Small bilateral pleural  effusions -Seen on CXR -Should resolve or improve with HD  Conjunctivitis -Continue erythromycin ophthalmic ointment  CODE STATUS/Goals of care -Spoke with daughters via phone, CODE STATUS has been changed to DO NOT RESUSCITATE. -consulted palliative care  Code Status: DO NOT RESUSCITATE   Family Communication: Children at bedside.  Disposition Plan: Admitted   Time Spent in minutes   30  minutes  Procedures  None  Consults   Nephrology Palliative care   DVT Prophylaxis  Heparin  Lab Results  Component Value Date   PLT 164 05/08/2014    Medications  Scheduled Meds: . anagrelide  2 mg Oral Daily  . antiseptic oral rinse  7 mL Mouth Rinse q12n4p  . aspirin EC  81 mg Oral Daily  . b complex-vitamin c-folic acid  1 tablet Oral QHS  . calcium acetate  1,334 mg Oral TID WC & HS  . chlorhexidine  15 mL Mouth Rinse BID  . docusate sodium  100 mg Oral Daily  . erythromycin   Both Eyes 4 times per day  . febuxostat  40 mg Oral Daily  . heparin  5,000 Units Subcutaneous 3 times per day  . midodrine  10 mg Oral TID WC  . pantoprazole  40 mg Oral Daily  . piperacillin-tazobactam (ZOSYN)  IV  2.25 g Intravenous 3 times per day  . pregabalin  100 mg Oral QHS  . sodium chloride  3 mL Intravenous Q12H  . vancomycin  500 mg Intravenous Q T,Th,Sa-HD   Continuous Infusions:  PRN Meds:.acetaminophen **OR** acetaminophen, morphine injection, senna, sodium chloride  Antibiotics    Anti-infectives    Start     Dose/Rate Route Frequency Ordered Stop   05/07/14 1200  vancomycin (VANCOCIN) 500 mg in sodium chloride 0.9 % 100 mL IVPB     500 mg100 mL/hr over 60 Minutes Intravenous Every T-Th-Sa (Hemodialysis) 05/07/14 0209     05/07/14 0600  piperacillin-tazobactam (ZOSYN) IVPB 2.25 g     2.25 g100 mL/hr over 30 Minutes Intravenous 3 times per day 05/07/14 0207     05/07/14 0215  piperacillin-tazobactam (ZOSYN) IVPB 2.25 g     2.25 g100 mL/hr over 30 Minutes Intravenous  Once  05/07/14 0209 05/07/14 0424   05/07/14 0215  vancomycin (VANCOCIN) 1,250 mg in sodium chloride 0.9 % 250 mL IVPB     1,250 mg166.7 mL/hr over 90 Minutes Intravenous  Once 05/07/14 0209 05/07/14 0534        Subjective:   Ryan Arellano seen and examined today.  Patient has no complaints this morning.  Denies chest pain, SOB, abdominal pain.    Objective:   Filed Vitals:   05/07/14 2213 05/07/14 2256 05/08/14 0500 05/08/14 0530  BP: 117/71 101/60  97/65  Pulse: 100 95  106  Temp: 97.6 F (36.4 C) 98 F (36.7 C)  97.8 F (36.6 C)  TempSrc: Axillary Oral  Oral  Resp: _0 Height:      Weight: 58.5 kg (128 lb 15.5 oz)  58.7 kg (129 lb 6.6 oz)   SpO2: 100% 99%  100%    Wt Readings from Last 3 Encounters:  05/08/14 58.7 kg (129 lb 6.6 oz)  04/15/14 58.06 kg (128 lb)  03/27/14 58.06 kg (128 lb)     Intake/Output Summary (Last 24 hours) at 05/08/14 1223 Last data filed at 05/07/14 2213  Gross per 24 hour  Intake     50 ml  Output   1595 ml  Net  -1545 ml    Exam  General: Well developed, NAD, appears stated age  HEENT: NCAT, mucous membranes moist.   Cardiovascular: S1 S2 auscultated, no rubs, murmurs or gallops.   Respiratory: Diminished, cannot fully evaluate as patient cannot inhale deeply  Abdomen: Soft, nontender, nondistended, + bowel sounds  Extremities: warm dry without cyanosis clubbing or edema  Neuro: AAOx2, No focal deficits  Skin: lower extremities wrapped  Data Review   Micro Results Recent Results (from the past 240 hour(s))  Culture, blood (routine x 2)     Status: None (Preliminary result)   Collection Time: 05/07/14  3:36 AM  Result Value Ref Range Status   Specimen Description BLOOD LEFT ARM  Final   Special Requests BOTTLES DRAWN AEROBIC ONLY [place]  Final   Culture   Final           BLOOD CULTURE RECEIVED NO GROWTH TO DATE CULTURE WILL BE HELD FOR 5 DAYS BEFORE ISSUING A FINAL NEGATIVE REPORT Performed at [facility]      Report Status PENDING  Incomplete  Culture, blood (routine x 2)     Status: None (Preliminary result)   Collection Time: 05/07/14  3:50 AM  Result Value Ref Range Status   Specimen Description BLOOD RIGHT HAND  Final  Special Requests BOTTLES DRAWN AEROBIC ONLY 4CC  Final   Culture   Final           BLOOD CULTURE RECEIVED NO GROWTH TO DATE CULTURE WILL BE HELD FOR 5 DAYS BEFORE ISSUING A FINAL NEGATIVE REPORT Performed at [facility]    Report Status PENDING  Incomplete    Radiology Reports Dg Chest 2 View  05/06/2014   CLINICAL DATA:  Altered mental status. Mid chest pain. History of pulmonary hypertension, atrial fibrillation, thrombocytopenia, hypertension, renal insufficiency.  EXAM: CHEST  2 VIEW  COMPARISON:  03/18/2010  FINDINGS: Shallow inspiration. Cardiac enlargement with diffuse pulmonary vascular congestion. Hazy perihilar infiltrates consistent with mild interstitial edema. Small bilateral pleural effusions with basilar atelectasis, greater on the left. Findings have progressed since previous study. Probable underlying emphysematous change. No pneumothorax. Calcification of the aorta. Mild thoracic kyphosis with degenerative changes in the thoracic spine. Compression of an upper lumbar vertebra, probably L1. This is new since the previous study of 2011 but appears chronic.  IMPRESSION: Cardiac enlargement with pulmonary vascular congestion and perihilar edema. Small bilateral pleural effusions with basilar atelectasis, greater on the left.   Electronically Signed   By: Lucienne Capers M.D.   On: 05/06/2014 21:48   Ct Head Wo Contrast  05/06/2014   CLINICAL DATA:  Worsening lethargy, with decreased appetite. Initial encounter.  EXAM: CT HEAD WITHOUT CONTRAST  TECHNIQUE: Contiguous axial images were obtained from the base of the skull through the vertex without intravenous contrast.  COMPARISON:  None.  FINDINGS: There is no evidence of acute infarction, mass lesion, or  intra- or extra-axial hemorrhage on CT.  Prominence of the ventricles and sulci reflects mild to moderate cortical volume loss. Cerebellar atrophy is noted. Mild periventricular white matter change likely reflects small vessel ischemic microangiopathy.  The brainstem and fourth ventricle are within normal limits. The basal ganglia are unremarkable in appearance. The cerebral hemispheres demonstrate grossly normal gray-white differentiation. No mass effect or midline shift is seen.  There is no evidence of fracture; visualized osseous structures are unremarkable in appearance. The visualized portions of the orbits are within normal limits. The paranasal sinuses and mastoid air cells are well-aerated. No significant soft tissue abnormalities are seen.  IMPRESSION: 1. No acute intracranial pathology seen on CT. 2. Mild to moderate cortical volume loss and scattered small vessel ischemic microangiopathy.   Electronically Signed   By: Garald Balding M.D.   On: 05/06/2014 22:12    CBC  Recent Labs Lab 05/06/14 2053 05/07/14 0955 05/08/14 0622  WBC 19.9* 15.9* 18.2*  HGB 12.7* 12.5* 13.6  HCT 41.8 40.5 43.0  PLT 220 188 164  MCV 94.1 93.3 94.1  MCH 28.6 28.8 29.8  MCHC 30.4 30.9 31.6  RDW 17.5* 17.4* 17.8*  LYMPHSABS 2.4  --   --   MONOABS 1.8*  --   --   EOSABS 0.0  --   --   BASOSABS 0.0  --   --     Chemistries   Recent Labs Lab 05/06/14 2053 05/08/14 0622  NA 136 141  K 3.6 4.8  CL 92* 100  CO2 33* 26  GLUCOSE 142* 73  BUN 16 13  CREATININE 4.25* 3.74*  CALCIUM 8.7 8.2*  AST 40* 34  ALT 15 15  ALKPHOS 121* 115  BILITOT 0.9 1.1   ------------------------------------------------------------------------------------------------------------------ estimated creatinine clearance is 10.9 mL/min (by C-G formula based on Cr of 3.74). ------------------------------------------------------------------------------------------------------------------ No results for input(s): HGBA1C in  the  last 72 hours. ------------------------------------------------------------------------------------------------------------------ No results for input(s): CHOL, HDL, LDLCALC, TRIG, CHOLHDL, LDLDIRECT in the last 72 hours. ------------------------------------------------------------------------------------------------------------------  Recent Labs  05/07/14 0336  TSH 5.588*   ------------------------------------------------------------------------------------------------------------------  Recent Labs  05/07/14 0350  [id number] 1656*    Coagulation profile No results for input(s): INR, PROTIME in the last 168 hours.  No results for input(s): DDIMER in the last 72 hours.  Cardiac Enzymes  Recent Labs Lab 05/07/14 0350 05/07/14 0955 05/07/14 1530  TROPONINI 0.09* 0.05* 0.04*   ------------------------------------------------------------------------------------------------------------------ Invalid input(s): POCBNP    Rafia Shedden D.O. on 05/08/2014 at 12:23 PM  Between 7am to 7pm - Pager - [phone]  After 7pm go to [email or web address] - password [email or web address]  And look for the night coverage person covering for me after hours  [facility] Office  [phone]

## 2014-05-08 NOTE — Evaluation (Signed)
Clinical/Bedside Swallow Evaluation Patient Details  Name: Ryan Arellano MRN: [id number] Date of Birth: 21-Dec-1921  Today's Date: 05/08/2014 Time: SLP Start Time (ACUTE ONLY): 5597 SLP Stop Time (ACUTE ONLY): 1458 SLP Time Calculation (min) (ACUTE ONLY): 27 min  Past Medical History:  Past Medical History  Diagnosis Date  . Pulmonary hypertension   . Atrial fibrillation   . Thrombocytopenia   . Anemia   . Arthritis   . Diverticulitis   . Hypertension   . Renal insufficiency   . Skin lesion of back   . DIVERTICULITIS, HX OF 02/21/2010         Past Surgical History:  Past Surgical History  Procedure Laterality Date  . Ruptured colon  1980  . S/p colectomy  1980  . Tonsillectomy    . Av fistula placement  2002    left arm  . Cataract extraction Bilateral    HPI:  81 yo male with Afib, ESRD on HD, diverticulitis, anemia and FTT. Per chart, pt not been eating well over the past 2 weeks, and gradually getting weaker.  CT no acute intracranial pathology. CXR cardiac enlargement with pulmonary vascular congestion and perihilar edema. Small bilateral pleural effusions with basilar atelectasis, greater on the left.   Assessment / Plan / Recommendation Clinical Impression  Swallow assessement performed with family and Palliative Care RN present. Pt demonstrated indications of intermittently decreased airway protection with delayed cough following thin liquids. Family notes coughing with liquids for one month inconsistently. Educated family and discussed options for safe swallowing (thick liquids versus thin and various dieit textures). Dys 3 texture (family reports prolonged mastication with certain foods) and nectar thick liquids decided upon with ST follow up and modifications if needed. Educated on importance of upright position as pt able (has bedsores).      Aspiration Risk  Moderate    Diet Recommendation Dysphagia 3 (Mechanical Soft);Nectar-thick liquid   Liquid  Administration via: Cup;No straw Medication Administration: Whole meds with puree Supervision: Patient able to self feed;Full supervision/cueing for compensatory strategies Compensations: Slow rate;Small sips/bites Postural Changes and/or Swallow Maneuvers: Seated upright 90 degrees    Other  Recommendations Oral Care Recommendations: Oral care BID   Follow Up Recommendations  Home health SLP    Frequency and Duration min 2x/week  2 weeks   Pertinent Vitals/Pain Leg pain, Palliative RN aware         Swallow Study          Oral/Motor/Sensory Function Overall Oral Motor/Sensory Function: Appears within functional limits for tasks assessed   Ice Chips Ice chips: Not tested   Thin Liquid Thin Liquid: Impaired Presentation: Cup;Straw Oral Phase Impairments:  (none) Pharyngeal  Phase Impairments: Suspected delayed Swallow;Cough - Delayed    Nectar Thick Nectar Thick Liquid: Not tested   Honey Thick Honey Thick Liquid: Not tested   Puree Puree: Within functional limits   Solid   GO    Solid: Impaired Oral Phase Functional Implications:  (mild delayed transit)       Dilyn Smiles, Orbie Pyo 05/08/2014,3:34 PM  Orbie Pyo Colvin Caroli.Ed [employer] [phone]

## 2014-05-08 NOTE — Progress Notes (Signed)
[facility] Progress Note  Assessment/Plan: 1. FTT - palliative care consult 2. ESRD - TTS - on HD since 2014 - HD tomorrow try to get volume down 3. Anemia - Hgb up no ESA and hold Fe - ferritin is 1800 4. Secondary hyperparathyroidism - corr Ca < 10 - resume Hectorol, if Ca gets high try 2 Ca bath before stopping Hectorol 5. HTN/volume - CXR on adm showed small bilateral pleural effusions and pul vasc congestion ; continue to titrate volume down as BP allows - on midodrine ; sats ok 6. Nutrition - npo for swallowing study - advance as tolerate and add nepro when cleared for eating 7. Chronic leukocytosis - was 15 K Dec and Jan and 17 K last November at his outpt unit; cultures done - empiric Vanc and Zosyn - no fevers,  8.  Thrombocythemia - followed by Dr. Julien Nordmann- on agrylin 9.  DNR  Myriam Jacobson, PA-C [place] [phone] 05/08/2014,11:31 AM  LOS: 2 days   Pt seen, examined, agree w assess/plan as above with additions as indicated.  BP's are low and vol excess. FTT.  Poor prognosis, palliative care consult pending. HD Sat, lowering BP threshold.  Kelly Splinter MD pager [phone]    cell [phone] 05/08/2014, 1:46 PM     Subjective:   Pain in right lower leg; denies problems with HD yesterday  Objective Filed Vitals:   05/07/14 2213 05/07/14 2256 05/08/14 0500 05/08/14 0530  BP: 117/71 101/60  97/65  Pulse: 100 95  106  Temp: 97.6 F (36.4 C) 98 F (36.7 C)  97.8 F (36.6 C)  TempSrc: Axillary Oral  Oral  Resp: 20 22  16   Height:      Weight: 58.5 kg (128 lb 15.5 oz)  58.7 kg (129 lb 6.6 oz)   SpO2: 100% 99%  100%   Physical Exam General: frail, weak, speech soft but clear Heart: RRR Lungs: crackles at bases with dim BS Abdomen: soft BS+ ? Hernia at old incision site left abd Extremities: -puffy generalized edema, LE wrapped with gauze Dialysis Access: left upper AVF + bruit  Dialysis Orders: TTS NW 4h160 2/2.25 Bath  55.5 - gets to 56-56.5 min vol Heparin 3000 LUA AVF Hectorol 1 ug  Venofer 100/hd thru 2/4 Hgb was 12.6 last with tsat 22% and ferritin 1800 so would not give IV Fe  Additional Objective Labs: Basic Metabolic Panel:  Recent Labs Lab 05/06/14 2053 05/08/14 0622  NA 136 141  K 3.6 4.8  CL 92* 100  CO2 33* 26  GLUCOSE 142* 73  BUN 16 13  CREATININE 4.25* 3.74*  CALCIUM 8.7 8.2*   Liver Function Tests:  Recent Labs Lab 05/06/14 2053 05/08/14 0622  AST 40* 34  ALT 15 15  ALKPHOS 121* 115  BILITOT 0.9 1.1  PROT 5.9* 5.7*  ALBUMIN 2.5* 2.3*   CBC:  Recent Labs Lab 05/06/14 2053 05/07/14 0955 05/08/14 0622  WBC 19.9* 15.9* 18.2*  NEUTROABS 15.7*  --   --   HGB 12.7* 12.5* 13.6  HCT 41.8 40.5 43.0  MCV 94.1 93.3 94.1  PLT 220 188 164   Blood Culture    Component Value Date/Time   SDES BLOOD RIGHT HAND 05/07/2014 0350   SPECREQUEST BOTTLES DRAWN AEROBIC ONLY 4CC 05/07/2014 0350   CULT  05/07/2014 0350           BLOOD CULTURE RECEIVED NO GROWTH TO DATE CULTURE WILL BE HELD FOR 5 DAYS BEFORE ISSUING  A FINAL NEGATIVE REPORT Performed at [facility] PENDING 05/07/2014 0350    Cardiac Enzymes:  Recent Labs Lab 05/06/14 2053 05/07/14 0350 05/07/14 0955 05/07/14 1530  TROPONINI 0.06* 0.09* 0.05* 0.04*   CBG:  Recent Labs Lab 05/07/14 1218 05/07/14 2255 05/08/14 0422 05/08/14 0741  GLUCAP 74 72 83 76  Studies/Results: Dg Chest 2 View  05/06/2014   CLINICAL DATA:  Altered mental status. Mid chest pain. History of pulmonary hypertension, atrial fibrillation, thrombocytopenia, hypertension, renal insufficiency.  EXAM: CHEST  2 VIEW  COMPARISON:  03/18/2010  FINDINGS: Shallow inspiration. Cardiac enlargement with diffuse pulmonary vascular congestion. Hazy perihilar infiltrates consistent with mild interstitial edema. Small bilateral pleural effusions with basilar atelectasis, greater on the left. Findings have progressed since  previous study. Probable underlying emphysematous change. No pneumothorax. Calcification of the aorta. Mild thoracic kyphosis with degenerative changes in the thoracic spine. Compression of an upper lumbar vertebra, probably L1. This is new since the previous study of 2011 but appears chronic.  IMPRESSION: Cardiac enlargement with pulmonary vascular congestion and perihilar edema. Small bilateral pleural effusions with basilar atelectasis, greater on the left.   Electronically Signed   By: Lucienne Capers M.D.   On: 05/06/2014 21:48   Ct Head Wo Contrast  05/06/2014   CLINICAL DATA:  Worsening lethargy, with decreased appetite. Initial encounter.  EXAM: CT HEAD WITHOUT CONTRAST  TECHNIQUE: Contiguous axial images were obtained from the base of the skull through the vertex without intravenous contrast.  COMPARISON:  None.  FINDINGS: There is no evidence of acute infarction, mass lesion, or intra- or extra-axial hemorrhage on CT.  Prominence of the ventricles and sulci reflects mild to moderate cortical volume loss. Cerebellar atrophy is noted. Mild periventricular white matter change likely reflects small vessel ischemic microangiopathy.  The brainstem and fourth ventricle are within normal limits. The basal ganglia are unremarkable in appearance. The cerebral hemispheres demonstrate grossly normal gray-white differentiation. No mass effect or midline shift is seen.  There is no evidence of fracture; visualized osseous structures are unremarkable in appearance. The visualized portions of the orbits are within normal limits. The paranasal sinuses and mastoid air cells are well-aerated. No significant soft tissue abnormalities are seen.  IMPRESSION: 1. No acute intracranial pathology seen on CT. 2. Mild to moderate cortical volume loss and scattered small vessel ischemic microangiopathy.   Electronically Signed   By: Garald Balding M.D.   On: 05/06/2014 22:12   Medications:   . anagrelide  2 mg Oral Daily  .  antiseptic oral rinse  7 mL Mouth Rinse q12n4p  . aspirin EC  81 mg Oral Daily  . b complex-vitamin c-folic acid  1 tablet Oral QHS  . calcium acetate  1,334 mg Oral TID WC & HS  . chlorhexidine  15 mL Mouth Rinse BID  . docusate sodium  100 mg Oral Daily  . erythromycin   Both Eyes 4 times per day  . febuxostat  40 mg Oral Daily  . ferric gluconate (FERRLECIT/NULECIT) IV  125 mg Intravenous Q T,Th,Sa-HD  . heparin  5,000 Units Subcutaneous 3 times per day  . midodrine  10 mg Oral TID WC  . pantoprazole  40 mg Oral Daily  . piperacillin-tazobactam (ZOSYN)  IV  2.25 g Intravenous 3 times per day  . pregabalin  100 mg Oral QHS  . sodium chloride  3 mL Intravenous Q12H  . vancomycin  500 mg Intravenous Q T,Th,Sa-HD

## 2014-05-08 NOTE — Progress Notes (Deleted)
CARE MANAGEMENT NOTE 05/08/2014  Patient:  Ryan Arellano, Ryan Arellano   Account Number:  [id number]  Date Initiated:  05/08/2014  Documentation initiated by:  Tomi Bamberger  Subjective/Objective Assessment:   dx ftt  admit- from [facility].     Action/Plan:   Anticipated DC Date:  05/09/2014   Anticipated DC Plan:  SKILLED NURSING FACILITY  In-house referral  Clinical Social Worker      DC Planning Services  CM consult      Choice offered to / List presented to:             Status of service:  In process, will continue to follow Medicare Important Message given?  YES (If response is "NO", the following Medicare IM given date fields will be blank) Date Medicare IM given:  05/08/2014 Medicare IM given by:  Tomi Bamberger Date Additional Medicare IM given:   Additional Medicare IM given by:    Discharge Disposition:    Per UR Regulation:    If discussed at Long Length of Stay Meetings, dates discussed:    Comments:  05/08/14 Tom Green RN,BSN [phone] patient from [facility] SN[facility],CSW referral.

## 2014-05-08 NOTE — Progress Notes (Signed)
Patient back from HD, alert and oriented x3 (time, self and place). In no distress at this time. V/s taken. Will continue to monitor.

## 2014-05-09 LAB — CBC
HCT: 36.6 % — ABNORMAL LOW (ref 39.0–52.0)
HEMOGLOBIN: 11.8 g/dL — AB (ref 13.0–17.0)
MCH: 29.4 pg (ref 26.0–34.0)
MCHC: 32.2 g/dL (ref 30.0–36.0)
MCV: 91.3 fL (ref 78.0–100.0)
Platelets: ADEQUATE 10*3/uL (ref 150–400)
RBC: 4.01 MIL/uL — AB (ref 4.22–5.81)
RDW: 18.1 % — ABNORMAL HIGH (ref 11.5–15.5)
WBC: 14.7 10*3/uL — AB (ref 4.0–10.5)

## 2014-05-09 LAB — GLUCOSE, CAPILLARY
GLUCOSE-CAPILLARY: 96 mg/dL (ref 70–99)
Glucose-Capillary: 123 mg/dL — ABNORMAL HIGH (ref 70–99)

## 2014-05-09 LAB — BASIC METABOLIC PANEL WITH GFR
Anion gap: 15 (ref 5–15)
BUN: 20 mg/dL (ref 6–23)
CO2: 25 mmol/L (ref 19–32)
Calcium: 8.8 mg/dL (ref 8.4–10.5)
Chloride: 99 mmol/L (ref 96–112)
Creatinine, Ser: 4.82 mg/dL — ABNORMAL HIGH (ref 0.50–1.35)
GFR calc Af Amer: 11 mL/min — ABNORMAL LOW
GFR calc non Af Amer: 10 mL/min — ABNORMAL LOW
Glucose, Bld: 94 mg/dL (ref 70–99)
Potassium: 5.7 mmol/L — ABNORMAL HIGH (ref 3.5–5.1)
Sodium: 139 mmol/L (ref 135–145)

## 2014-05-09 LAB — HEPATITIS B SURFACE ANTIBODY,QUALITATIVE: Hep B S Ab: NONREACTIVE — AB

## 2014-05-09 MED ORDER — CALCIUM ACETATE 667 MG PO CAPS
667.0000 mg | ORAL_CAPSULE | Freq: Three times a day (TID) | ORAL | Status: DC
  Administered 2014-05-09 – 2014-05-10 (×5): 667 mg via ORAL
  Filled 2014-05-09 (×11): qty 1

## 2014-05-09 MED ORDER — ALBUMIN HUMAN 25 % IV SOLN
12.5000 g | Freq: Once | INTRAVENOUS | Status: AC
  Administered 2014-05-09: 12.5 g via INTRAVENOUS

## 2014-05-09 MED ORDER — MIDODRINE HCL 5 MG PO TABS
ORAL_TABLET | ORAL | Status: AC
  Filled 2014-05-09: qty 2

## 2014-05-09 MED ORDER — ALBUMIN HUMAN 25 % IV SOLN
INTRAVENOUS | Status: AC
  Filled 2014-05-09: qty 50

## 2014-05-09 NOTE — Progress Notes (Signed)
[facility]                                                                              Patient Demographics  Ryan Arellano, is a 81 y.o. male, DOB - [birth year], DSK:[id number]  Admit date - 05/06/2014   Admitting Physician Jani Gravel, MD  Outpatient Primary MD for the patient is Garret Reddish, MD  LOS - 3   Chief Complaint  Patient presents with  . Fatigue      HPI on 05/07/2014 by Dr. Jani Gravel 81 yo male with Afib, ESRD on HD (T, T, S), Anemia apparently has FTT, Has not been eating well over the past 2 weeks, and gradually getting weaker. Per family denies fever, chills, cp, palp, sob, abd pain, diarrhea, brbpr, black stool. Slight cough, Dry. Pt has been more lethargic over the past 48 hours, and this prompted his family to bring him in. Pt will be admitted for FTT.  Assessment & Plan   Altered mental status/Failure to thrive -CT head: No acute intracranial pathology -Patient appears to be at baseline -TSH 5.58, B12 1656, Folate >620, ANA neg, ESR 30  -Nutrition and speech consulted, patient started on dysphagia 3 diet  Skin ulcerations/Cellulitis/postherpetic neuralgia -Continue Vanc and Zosyn -Wound care consulted -Patient recently seen by Dr. Yong Channel 04/13/2014 in the office for postherpetic neuralgia and cellulitis- was treated with doxycycline  Leukocytosis, chronic  -Possible secondary to above; however patient has had elevated WBC since Feb 2015 -Blood cultures show no growth to date -CXR: Cardiac enlargement with pulmonary vascular congestion, bilateral small pleural effusions -Patient afebrile -Cannot obtain UA due to ESRD -Continue to monitor CBC  Thrombocythemia -Followed by Dr. Julien Nordmann -Contiue Agrylin  Elevated troponin -Peak trop 0.09, now 0.04 -Unlikely accurate marker of ACS in ESRD patient -Echo pending  Mildly Elevated LFTs -Resovled, Possibly secondary to FTT  ESRD -Dialyzes on Tuesday, Thursday, Saturday -Nephrology consult  appreciated  Small bilateral pleural effusions -Seen on CXR -Should resolve or improve with HD  Conjunctivitis -Continue erythromycin ophthalmic ointment  Hyperkalemia -Should resolve with HD, will continue to monitor  CODE STATUS/Goals of care -Spoke with daughters via phone, CODE STATUS has been changed to DO NOT RESUSCITATE. -consulted palliative care  Code Status: DO NOT RESUSCITATE   Family Communication: None at bedside- patient seen dialysis.  Disposition Plan: Admitted   Time Spent in minutes   30  minutes  Procedures  None  Consults   Nephrology Palliative care   DVT Prophylaxis  Heparin  Lab Results  Component Value Date   PLT  05/09/2014    PLATELET CLUMPS NOTED ON SMEAR, COUNT APPEARS ADEQUATE    Medications  Scheduled Meds: . albumin human      . anagrelide  2 mg Oral Daily  . antiseptic oral rinse  7 mL Mouth Rinse q12n4p  . aspirin EC  81 mg Oral Daily  . b complex-vitamin c-folic acid  1 tablet Oral QHS  . calcium acetate  1,334 mg Oral TID WC & HS  . chlorhexidine  15 mL Mouth Rinse BID  . docusate sodium  100 mg Oral Daily  . erythromycin   Both Eyes 4 times per day  .  febuxostat  40 mg Oral Daily  . feeding supplement (NEPRO CARB STEADY)  237 mL Oral TID BM  . heparin  5,000 Units Subcutaneous 3 times per day  . magic mouthwash w/lidocaine  15 mL Oral TID  . midodrine      . midodrine  10 mg Oral TID WC  . pantoprazole  40 mg Oral Daily  . piperacillin-tazobactam (ZOSYN)  IV  2.25 g Intravenous 3 times per day  . pregabalin  100 mg Oral QHS  . sodium chloride  3 mL Intravenous Q12H  . vancomycin  500 mg Intravenous Q T,Th,Sa-HD   Continuous Infusions:  PRN Meds:.acetaminophen **OR** acetaminophen, bisacodyl, morphine injection, RESOURCE THICKENUP CLEAR, senna-docusate, sodium chloride  Antibiotics    Anti-infectives    Start     Dose/Rate Route Frequency Ordered Stop   05/07/14 1200  vancomycin (VANCOCIN) 500 mg in sodium  chloride 0.9 % 100 mL IVPB     500 mg100 mL/hr over 60 Minutes Intravenous Every T-Th-Sa (Hemodialysis) 05/07/14 0209     05/07/14 0600  piperacillin-tazobactam (ZOSYN) IVPB 2.25 g     2.25 g100 mL/hr over 30 Minutes Intravenous 3 times per day 05/07/14 0207     05/07/14 0215  piperacillin-tazobactam (ZOSYN) IVPB 2.25 g     2.25 g100 mL/hr over 30 Minutes Intravenous  Once 05/07/14 0209 05/07/14 0424   05/07/14 0215  vancomycin (VANCOCIN) 1,250 mg in sodium chloride 0.9 % 250 mL IVPB     1,250 mg166.7 mL/hr over 90 Minutes Intravenous  Once 05/07/14 0209 05/07/14 0534        Subjective:   Ryan Arellano seen and examined today in dialysis.  Patient has no complaints this morning.  Denies chest pain, SOB, abdominal pain.  Patient states he "feels good."  Objective:   Filed Vitals:   05/09/14 0800 05/09/14 0810 05/09/14 0830 05/09/14 0900  BP: 100/61 79/65 90/66  108/56  Pulse: 89 88 88 83  Temp:      TempSrc:      Resp: 18  16 15   Height:      Weight:      SpO2:        Wt Readings from Last 3 Encounters:  05/09/14 56.5 kg (124 lb 9 oz)  04/15/14 58.06 kg (128 lb)  03/27/14 58.06 kg (128 lb)     Intake/Output Summary (Last 24 hours) at 05/09/14 0931 Last data filed at 05/09/14 0040  Gross per 24 hour  Intake    300 ml  Output      0 ml  Net    300 ml    Exam  General: Well developed, NAD, appears stated age  HEENT: NCAT, mucous membranes moist.   Cardiovascular: S1 S2 auscultated, no rubs, murmurs or gallops.   Respiratory: Diminished, crackles at the bases  Abdomen: Soft, nontender, nondistended, + bowel sounds  Extremities: warm dry without cyanosis clubbing or edema  Neuro: AAOx2, No focal deficits  Skin: lower extremities wrapped  Data Review   Micro Results Recent Results (from the past 240 hour(s))  Culture, blood (routine x 2)     Status: None (Preliminary result)   Collection Time: 05/07/14  3:36 AM  Result Value Ref Range Status   Specimen  Description BLOOD LEFT ARM  Final   Special Requests BOTTLES DRAWN AEROBIC ONLY [place]  Final   Culture   Final           BLOOD CULTURE RECEIVED NO GROWTH TO DATE CULTURE WILL BE HELD FOR  5 DAYS BEFORE ISSUING A FINAL NEGATIVE REPORT Performed at [facility]    Report Status PENDING  Incomplete  Culture, blood (routine x 2)     Status: None (Preliminary result)   Collection Time: 05/07/14  3:50 AM  Result Value Ref Range Status   Specimen Description BLOOD RIGHT HAND  Final   Special Requests BOTTLES DRAWN AEROBIC ONLY [facility]  Final   Culture   Final           BLOOD CULTURE RECEIVED NO GROWTH TO DATE CULTURE WILL BE HELD FOR 5 DAYS BEFORE ISSUING A FINAL NEGATIVE REPORT Performed at [facility]    Report Status PENDING  Incomplete    Radiology Reports Dg Chest 2 View  05/06/2014   CLINICAL DATA:  Altered mental status. Mid chest pain. History of pulmonary hypertension, atrial fibrillation, thrombocytopenia, hypertension, renal insufficiency.  EXAM: CHEST  2 VIEW  COMPARISON:  03/18/2010  FINDINGS: Shallow inspiration. Cardiac enlargement with diffuse pulmonary vascular congestion. Hazy perihilar infiltrates consistent with mild interstitial edema. Small bilateral pleural effusions with basilar atelectasis, greater on the left. Findings have progressed since previous study. Probable underlying emphysematous change. No pneumothorax. Calcification of the aorta. Mild thoracic kyphosis with degenerative changes in the thoracic spine. Compression of an upper lumbar vertebra, probably L1. This is new since the previous study of 2011 but appears chronic.  IMPRESSION: Cardiac enlargement with pulmonary vascular congestion and perihilar edema. Small bilateral pleural effusions with basilar atelectasis, greater on the left.   Electronically Signed   By: Lucienne Capers M.D.   On: 05/06/2014 21:48   Ct Head Wo Contrast  05/06/2014   CLINICAL DATA:  Worsening lethargy, with decreased  appetite. Initial encounter.  EXAM: CT HEAD WITHOUT CONTRAST  TECHNIQUE: Contiguous axial images were obtained from the base of the skull through the vertex without intravenous contrast.  COMPARISON:  None.  FINDINGS: There is no evidence of acute infarction, mass lesion, or intra- or extra-axial hemorrhage on CT.  Prominence of the ventricles and sulci reflects mild to moderate cortical volume loss. Cerebellar atrophy is noted. Mild periventricular white matter change likely reflects small vessel ischemic microangiopathy.  The brainstem and fourth ventricle are within normal limits. The basal ganglia are unremarkable in appearance. The cerebral hemispheres demonstrate grossly normal gray-white differentiation. No mass effect or midline shift is seen.  There is no evidence of fracture; visualized osseous structures are unremarkable in appearance. The visualized portions of the orbits are within normal limits. The paranasal sinuses and mastoid air cells are well-aerated. No significant soft tissue abnormalities are seen.  IMPRESSION: 1. No acute intracranial pathology seen on CT. 2. Mild to moderate cortical volume loss and scattered small vessel ischemic microangiopathy.   Electronically Signed   By: Garald Balding M.D.   On: 05/06/2014 22:12    CBC  Recent Labs Lab 05/06/14 2053 05/07/14 0940 05/07/14 0955 05/08/14 0622 05/09/14 0559  WBC 19.9*  --  15.9* 18.2* 14.7*  HGB 12.7*  --  12.5* 13.6 11.8*  HCT 41.8 41.0 40.5 43.0 36.6*  PLT 220  --  188 164 PLATELET CLUMPS NOTED ON SMEAR, COUNT APPEARS ADEQUATE  MCV 94.1  --  93.3 94.1 91.3  MCH 28.6  --  28.8 29.8 29.4  MCHC 30.4  --  30.9 31.6 32.2  RDW 17.5*  --  17.4* 17.8* 18.1*  LYMPHSABS 2.4  --   --   --   --   MONOABS 1.8*  --   --   --   --  EOSABS 0.0  --   --   --   --   BASOSABS 0.0  --   --   --   --     Chemistries   Recent Labs Lab 05/06/14 2053 05/08/14 0622 05/09/14 0559  NA 136 141 139  K 3.6 4.8 5.7*  CL 92* 100 99    CO2 33* 26 25  GLUCOSE 142* 73 94  BUN 16 13 20   CREATININE 4.25* 3.74* 4.82*  CALCIUM 8.7 8.2* 8.8  AST 40* 34  --   ALT 15 15  --   ALKPHOS 121* 115  --   BILITOT 0.9 1.1  --    ------------------------------------------------------------------------------------------------------------------ estimated creatinine clearance is 8.1 mL/min (by C-G formula based on Cr of 4.82). ------------------------------------------------------------------------------------------------------------------ No results for input(s): HGBA1C in the last 72 hours. ------------------------------------------------------------------------------------------------------------------ No results for input(s): CHOL, HDL, LDLCALC, TRIG, CHOLHDL, LDLDIRECT in the last 72 hours. ------------------------------------------------------------------------------------------------------------------  Recent Labs  05/07/14 0336  TSH 5.588*   ------------------------------------------------------------------------------------------------------------------  Recent Labs  05/07/14 0350  [id number] 1656*    Coagulation profile No results for input(s): INR, PROTIME in the last 168 hours.  No results for input(s): DDIMER in the last 72 hours.  Cardiac Enzymes  Recent Labs Lab 05/07/14 0350 05/07/14 0955 05/07/14 1530  TROPONINI 0.09* 0.05* 0.04*   ------------------------------------------------------------------------------------------------------------------ Invalid input(s): POCBNP    Danille Oppedisano D.O. on 05/09/2014 at 9:31 AM  Between 7am to 7pm - Pager - [phone]  After 7pm go to [email or web address] - password [email or web address]  And look for the night coverage person covering for me after hours  [facility] Office  [phone]

## 2014-05-09 NOTE — Progress Notes (Signed)
PT Cancellation Note  Patient Details Name: Ryan Arellano MRN: [id number] DOB: 1921-07-02   Cancelled Treatment:    Reason Eval/Treat Not Completed: Pain limiting ability to participate;Fatigue/lethargy limiting ability to participate.  Patient returned from HD this AM, pain in R leg "cramp".  Patient reports Palliative care looking for placement in SNF at this time.  Hold PT evaluation at this time, will re-attempt as able.   Zenia Resides, Jordi Lacko L 05/09/2014, 2:47 PM

## 2014-05-09 NOTE — Progress Notes (Signed)
Subjective:  Just back from HD/ slightly lethargic,multiple family members in room , he recognizes me from OP HD.  Objective Vital signs in last 24 hours: Filed Vitals:   05/09/14 1100 05/09/14 1130 05/09/14 1157 05/09/14 1244  BP: 91/60 97/63 105/65 103/64  Pulse: 79 78 92 83  Temp:   97.3 F (36.3 C) 97.6 F (36.4 C)  TempSrc:   Oral Oral  Resp: 14 15 14    Height:      Weight:   54.2 kg (119 lb 7.8 oz)   SpO2:   98% 98%   Weight change: -1.2 kg (-2 lb 10.3 oz)  Physical Exam: General: Lethargic/ confused , oriented to person only/ moves all extrem  To request Heart: RRR, soft 1/6 sem lsb, no rub, or gallop Lungs: Decr bs  Bases  Otherwise cta Abdomen: soft , NT, ND Extremities:no pedal edema/ R lower leg bandages not removed Dialysis Access: Pos bruit L ua avf    OP HD: TTS NW 4h 2/2.25 Bath Dry wt=55.5 Heparin 3000 LUA AVF Hectorol 1 ug (will hold as Ca up) Venofer 100/hd thru 2/4  Op pth 45/ ho ca 10/ phos 2.9   Problem/Plan:  1. FTT - palliative care consult to see 2. ESRD - TTS  NW- had HD today tolerated. 3 HTN/volume  - CXR on adm showed small bilateral pleural effusions and pul vasc congestion ; continue to titrate volume down as BP allows - on midodrine ;HD today 1300 uf to wt 54.2 bed wt 4. Anemia - Hgb up  To 11.8 no ESA and holding  Fe - ferritin is 1800 5 Secondary hyperparathyroidism - corr Ca 10.2 - / reck ca ,phos in am / on pholo 2 ac and Hectorol 57mcg/  Dc vit d with pth 45 and decr phoslo 1 ac with ho low phos op 6. Nutrition - npo for swallowing study - Speech th seeing = Dysphagia 3 7. Chronic leukocytosis - was 15 K Dec and Jan and 17 K last November at his outpt unit; cultures done - empiric Vanc and Zosyn - no fevers,  Now 14.7/ wound care RN to eval leg wounds 8. Thrombocythemia - followed by Dr. Julien Nordmann- on agrylin 9. DNR  Ernest Haber, PA-C [place] [phone] 05/09/2014,1:05 PM  LOS: 3 days   Pt seen,  examined and agree w A/P as above.  Kelly Splinter MD pager [phone]    cell [phone] 05/09/2014, 3:46 PM   Labs: Basic Metabolic Panel:  Recent Labs Lab 05/06/14 2053 05/08/14 0622 05/09/14 0559  NA 136 141 139  K 3.6 4.8 5.7*  CL 92* 100 99  CO2 33* 26 25  GLUCOSE 142* 73 94  BUN 16 13 20   CREATININE 4.25* 3.74* 4.82*  CALCIUM 8.7 8.2* 8.8   Liver Function Tests:  Recent Labs Lab 05/06/14 2053 05/08/14 0622  AST 40* 34  ALT 15 15  ALKPHOS 121* 115  BILITOT 0.9 1.1  PROT 5.9* 5.7*  ALBUMIN 2.5* 2.3*    Recent Labs Lab 05/06/14 2053  05/07/14 0955 05/08/14 0622 05/09/14 0559  WBC 19.9*  --  15.9* 18.2* 14.7*  NEUTROABS 15.7*  --   --   --   --   HGB 12.7*  --  12.5* 13.6 11.8*  HCT 41.8  < > 40.5 43.0 36.6*  MCV 94.1  --  93.3 94.1 91.3  PLT 220  --  188 164 PLATELET CLUMPS NOTED ON SMEAR, COUNT APPEARS ADEQUATE  < > =  values in this interval not displayed. Cardiac Enzymes:  Recent Labs Lab 05/06/14 2053 05/07/14 0350 05/07/14 0955 05/07/14 1530  TROPONINI 0.06* 0.09* 0.05* 0.04*   CBG:  Recent Labs Lab 05/08/14 1229 05/08/14 1710 05/08/14 2005 05/09/14 0034 05/09/14 0445  GLUCAP 82 80 106* 123* 96    Studies/Results: No results found. Medications:   . albumin human      . anagrelide  2 mg Oral Daily  . antiseptic oral rinse  7 mL Mouth Rinse q12n4p  . aspirin EC  81 mg Oral Daily  . b complex-vitamin c-folic acid  1 tablet Oral QHS  . calcium acetate  1,334 mg Oral TID WC & HS  . chlorhexidine  15 mL Mouth Rinse BID  . docusate sodium  100 mg Oral Daily  . erythromycin   Both Eyes 4 times per day  . febuxostat  40 mg Oral Daily  . feeding supplement (NEPRO CARB STEADY)  237 mL Oral TID BM  . heparin  5,000 Units Subcutaneous 3 times per day  . magic mouthwash w/lidocaine  15 mL Oral TID  . midodrine  10 mg Oral TID WC  . pantoprazole  40 mg Oral Daily  . piperacillin-tazobactam (ZOSYN)  IV  2.25 g Intravenous 3 times per day   . pregabalin  100 mg Oral QHS  . sodium chloride  3 mL Intravenous Q12H  . vancomycin  500 mg Intravenous Q T,Th,Sa-HD

## 2014-05-10 DIAGNOSIS — I480 Paroxysmal atrial fibrillation: Secondary | ICD-10-CM

## 2014-05-10 DIAGNOSIS — Z992 Dependence on renal dialysis: Secondary | ICD-10-CM | POA: Diagnosis not present

## 2014-05-10 DIAGNOSIS — N186 End stage renal disease: Secondary | ICD-10-CM | POA: Diagnosis not present

## 2014-05-10 LAB — CBC
HCT: 44.6 % (ref 39.0–52.0)
Hemoglobin: 14.2 g/dL (ref 13.0–17.0)
MCH: 29.7 pg (ref 26.0–34.0)
MCHC: 31.8 g/dL (ref 30.0–36.0)
MCV: 93.3 fL (ref 78.0–100.0)
Platelets: 180 10*3/uL (ref 150–400)
RBC: 4.78 MIL/uL (ref 4.22–5.81)
RDW: 17.7 % — ABNORMAL HIGH (ref 11.5–15.5)
WBC: 13 10*3/uL — ABNORMAL HIGH (ref 4.0–10.5)

## 2014-05-10 LAB — BASIC METABOLIC PANEL
Anion gap: 16 — ABNORMAL HIGH (ref 5–15)
BUN: 7 mg/dL (ref 6–23)
CO2: 22 mmol/L (ref 19–32)
Calcium: 8.2 mg/dL — ABNORMAL LOW (ref 8.4–10.5)
Chloride: 97 mmol/L (ref 96–112)
Creatinine, Ser: 3.16 mg/dL — ABNORMAL HIGH (ref 0.50–1.35)
GFR calc Af Amer: 18 mL/min — ABNORMAL LOW (ref >90)
GFR calc non Af Amer: 16 mL/min — ABNORMAL LOW (ref >90)
GLUCOSE: 123 mg/dL — AB (ref 70–99)
POTASSIUM: 3.9 mmol/L (ref 3.5–5.1)
Sodium: 135 mmol/L (ref 135–145)

## 2014-05-10 NOTE — Progress Notes (Signed)
[facility]                                                                              Patient Demographics  Ryan Arellano, is a 81 y.o. male, DOB - 10-Apr-1922, TGG:[id number]  Admit date - 05/06/2014   Admitting Physician Jani Gravel, MD  Outpatient Primary MD for the patient is Garret Reddish, MD  LOS - 4   Chief Complaint  Patient presents with  . Fatigue      HPI on 05/07/2014 by Dr. Jani Gravel 81 yo male with Afib, ESRD on HD (T, T, S), Anemia apparently has FTT, Has not been eating well over the past 2 weeks, and gradually getting weaker. Per family denies fever, chills, cp, palp, sob, abd pain, diarrhea, brbpr, black stool. Slight cough, Dry. Pt has been more lethargic over the past 48 hours, and this prompted his family to bring him in. Pt will be admitted for FTT.  Assessment & Plan   Altered mental status/Failure to thrive -CT head: No acute intracranial pathology -Patient appears to be at baseline -TSH 5.58, B12 1656, Folate >620, ANA neg, ESR 30  -Nutrition and speech consulted, patient started on dysphagia 3 diet  Skin ulcerations/Cellulitis/postherpetic neuralgia -Continue Vanc and Zosyn -Wound care consulted -Patient recently seen by Dr. Yong Channel 04/13/2014 in the office for postherpetic neuralgia and cellulitis- was treated with doxycycline  Leukocytosis, chronic  -Possible secondary to above; however patient has had elevated WBC since Feb 2015 -Blood cultures show no growth to date -CXR: Cardiac enlargement with pulmonary vascular congestion, bilateral small pleural effusions -Patient afebrile -Cannot obtain UA due to ESRD -Continue to monitor CBC  Thrombocythemia -Followed by Dr. Julien Nordmann -Contiue Agrylin  Elevated troponin -Peak trop 0.09, now 0.04 -Unlikely accurate marker of ACS in ESRD patient -Echo pending  Mildly Elevated LFTs -Resovled, Possibly secondary to FTT  ESRD -Dialyzes on Tuesday, Thursday, Saturday -Nephrology consult  appreciated  Small bilateral pleural effusions -Seen on CXR -Should resolve or improve with HD  Conjunctivitis -Continue erythromycin ophthalmic ointment  Hyperkalemia -Resolved, will continue to monitor  Deconditioning -PT consulted   CODE STATUS/Goals of care -Spoke with daughters via phone, CODE STATUS has been changed to DO NOT RESUSCITATE. -consulted palliative care  Code Status: DO NOT RESUSCITATE   Family Communication: None at bedside  Disposition Plan: Admitted   Time Spent in minutes   30  minutes  Procedures  None  Consults   Nephrology Palliative care   DVT Prophylaxis  Heparin  Lab Results  Component Value Date   PLT 180 05/10/2014    Medications  Scheduled Meds: . anagrelide  2 mg Oral Daily  . antiseptic oral rinse  7 mL Mouth Rinse q12n4p  . aspirin EC  81 mg Oral Daily  . b complex-vitamin c-folic acid  1 tablet Oral QHS  . calcium acetate  667 mg Oral TID WC & HS  . chlorhexidine  15 mL Mouth Rinse BID  . docusate sodium  100 mg Oral Daily  . erythromycin   Both Eyes 4 times per day  . febuxostat  40 mg Oral Daily  . feeding supplement (NEPRO CARB STEADY)  237 mL Oral TID BM  .  heparin  5,000 Units Subcutaneous 3 times per day  . magic mouthwash w/lidocaine  15 mL Oral TID  . midodrine  10 mg Oral TID WC  . pantoprazole  40 mg Oral Daily  . piperacillin-tazobactam (ZOSYN)  IV  2.25 g Intravenous 3 times per day  . pregabalin  100 mg Oral QHS  . sodium chloride  3 mL Intravenous Q12H  . vancomycin  500 mg Intravenous Q T,Th,Sa-HD   Continuous Infusions:  PRN Meds:.acetaminophen **OR** acetaminophen, bisacodyl, morphine injection, RESOURCE THICKENUP CLEAR, senna-docusate, sodium chloride  Antibiotics    Anti-infectives    Start     Dose/Rate Route Frequency Ordered Stop   05/07/14 1200  vancomycin (VANCOCIN) 500 mg in sodium chloride 0.9 % 100 mL IVPB     500 mg100 mL/hr over 60 Minutes Intravenous Every T-Th-Sa (Hemodialysis)  05/07/14 0209     05/07/14 0600  piperacillin-tazobactam (ZOSYN) IVPB 2.25 g     2.25 g100 mL/hr over 30 Minutes Intravenous 3 times per day 05/07/14 0207     05/07/14 0215  piperacillin-tazobactam (ZOSYN) IVPB 2.25 g     2.25 g100 mL/hr over 30 Minutes Intravenous  Once 05/07/14 0209 05/07/14 0424   05/07/14 0215  vancomycin (VANCOCIN) 1,250 mg in sodium chloride 0.9 % 250 mL IVPB     1,250 mg166.7 mL/hr over 90 Minutes Intravenous  Once 05/07/14 0209 05/07/14 0534        Subjective:   Dontavius Keim seen and examined today.  Patient has no specific complaints this morning but feels pain all over .  Denies chest pain, SOB, abdominal pain.  Patient states he is "doing ok."  Objective:   Filed Vitals:   05/09/14 1244 05/09/14 2133 05/10/14 0500 05/10/14 0601  BP: 103/64 92/57  98/58  Pulse: 83 73  71  Temp: 97.6 F (36.4 C) 97.3 F (36.3 C)  97.7 F (36.5 C)  TempSrc: Oral Oral  Oral  Resp: _0 Height:      Weight:   54.4 kg (119 lb 14.9 oz)   SpO2: 98% 100%  97%    Wt Readings from Last 3 Encounters:  05/10/14 54.4 kg (119 lb 14.9 oz)  04/15/14 58.06 kg (128 lb)  03/27/14 58.06 kg (128 lb)     Intake/Output Summary (Last 24 hours) at 05/10/14 1141 Last data filed at 05/10/14 0900  Gross per 24 hour  Intake    692 ml  Output   1327 ml  Net   -635 ml    Exam  General: Well developed, NAD, appears stated age  8: NCAT, mucous membranes moist.   Cardiovascular: S1 S2 auscultated   Respiratory: Diminished breath sounds at the bases  Abdomen: Soft, nontender, nondistended, + bowel sounds  Extremities: warm dry without cyanosis clubbing or edema  Neuro: AAOx2, No focal deficits  Skin: lower extremities wrapped  Data Review   Micro Results Recent Results (from the past 240 hour(s))  Culture, blood (routine x 2)     Status: None (Preliminary result)   Collection Time: 05/07/14  3:36 AM  Result Value Ref Range Status   Specimen Description  BLOOD LEFT ARM  Final   Special Requests BOTTLES DRAWN AEROBIC ONLY [place]  Final   Culture   Final           BLOOD CULTURE RECEIVED NO GROWTH TO DATE CULTURE WILL BE HELD FOR 5 DAYS BEFORE ISSUING A FINAL NEGATIVE REPORT Performed at [facility]  Report Status PENDING  Incomplete  Culture, blood (routine x 2)     Status: None (Preliminary result)   Collection Time: 05/07/14  3:50 AM  Result Value Ref Range Status   Specimen Description BLOOD RIGHT HAND  Final   Special Requests BOTTLES DRAWN AEROBIC ONLY 4CC  Final   Culture   Final           BLOOD CULTURE RECEIVED NO GROWTH TO DATE CULTURE WILL BE HELD FOR 5 DAYS BEFORE ISSUING A FINAL NEGATIVE REPORT Performed at [facility]    Report Status PENDING  Incomplete    Radiology Reports Dg Chest 2 View  05/06/2014   CLINICAL DATA:  Altered mental status. Mid chest pain. History of pulmonary hypertension, atrial fibrillation, thrombocytopenia, hypertension, renal insufficiency.  EXAM: CHEST  2 VIEW  COMPARISON:  03/18/2010  FINDINGS: Shallow inspiration. Cardiac enlargement with diffuse pulmonary vascular congestion. Hazy perihilar infiltrates consistent with mild interstitial edema. Small bilateral pleural effusions with basilar atelectasis, greater on the left. Findings have progressed since previous study. Probable underlying emphysematous change. No pneumothorax. Calcification of the aorta. Mild thoracic kyphosis with degenerative changes in the thoracic spine. Compression of an upper lumbar vertebra, probably L1. This is new since the previous study of 2011 but appears chronic.  IMPRESSION: Cardiac enlargement with pulmonary vascular congestion and perihilar edema. Small bilateral pleural effusions with basilar atelectasis, greater on the left.   Electronically Signed   By: Lucienne Capers M.D.   On: 05/06/2014 21:48   Ct Head Wo Contrast  05/06/2014   CLINICAL DATA:  Worsening lethargy, with decreased appetite. Initial  encounter.  EXAM: CT HEAD WITHOUT CONTRAST  TECHNIQUE: Contiguous axial images were obtained from the base of the skull through the vertex without intravenous contrast.  COMPARISON:  None.  FINDINGS: There is no evidence of acute infarction, mass lesion, or intra- or extra-axial hemorrhage on CT.  Prominence of the ventricles and sulci reflects mild to moderate cortical volume loss. Cerebellar atrophy is noted. Mild periventricular white matter change likely reflects small vessel ischemic microangiopathy.  The brainstem and fourth ventricle are within normal limits. The basal ganglia are unremarkable in appearance. The cerebral hemispheres demonstrate grossly normal gray-white differentiation. No mass effect or midline shift is seen.  There is no evidence of fracture; visualized osseous structures are unremarkable in appearance. The visualized portions of the orbits are within normal limits. The paranasal sinuses and mastoid air cells are well-aerated. No significant soft tissue abnormalities are seen.  IMPRESSION: 1. No acute intracranial pathology seen on CT. 2. Mild to moderate cortical volume loss and scattered small vessel ischemic microangiopathy.   Electronically Signed   By: Garald Balding M.D.   On: 05/06/2014 22:12    CBC  Recent Labs Lab 05/06/14 2053 05/07/14 0940 05/07/14 0955 05/08/14 0622 05/09/14 0559 05/10/14 0405  WBC 19.9*  --  15.9* 18.2* 14.7* 13.0*  HGB 12.7*  --  12.5* 13.6 11.8* 14.2  HCT 41.8 41.0 40.5 43.0 36.6* 44.6  PLT 220  --  188 164 PLATELET CLUMPS NOTED ON SMEAR, COUNT APPEARS ADEQUATE 180  MCV 94.1  --  93.3 94.1 91.3 93.3  MCH 28.6  --  28.8 29.8 29.4 29.7  MCHC 30.4  --  30.9 31.6 32.2 31.8  RDW 17.5*  --  17.4* 17.8* 18.1* 17.7*  LYMPHSABS 2.4  --   --   --   --   --   MONOABS 1.8*  --   --   --   --   --  EOSABS 0.0  --   --   --   --   --   BASOSABS 0.0  --   --   --   --   --     Chemistries   Recent Labs Lab 05/06/14 2053 05/08/14 0622  05/09/14 0559 05/10/14 0405  NA 136 141 139 135  K 3.6 4.8 5.7* 3.9  CL 92* 100 99 97  CO2 33* _0 GLUCOSE 142* 73 94 123*  BUN _1 CREATININE 4.25* 3.74* 4.82* 3.16*  CALCIUM 8.7 8.2* 8.8 8.2*  AST 40* 34  --   --   ALT 15 15  --   --   ALKPHOS 121* 115  --   --   BILITOT 0.9 1.1  --   --    ------------------------------------------------------------------------------------------------------------------ estimated creatinine clearance is 12 mL/min (by C-G formula based on Cr of 3.16). ------------------------------------------------------------------------------------------------------------------ No results for input(s): HGBA1C in the last 72 hours. ------------------------------------------------------------------------------------------------------------------ No results for input(s): CHOL, HDL, LDLCALC, TRIG, CHOLHDL, LDLDIRECT in the last 72 hours. ------------------------------------------------------------------------------------------------------------------ No results for input(s): TSH, T4TOTAL, T3FREE, THYROIDAB in the last 72 hours.  Invalid input(s): FREET3 ------------------------------------------------------------------------------------------------------------------ No results for input(s): VITAMINB12, FOLATE, FERRITIN, TIBC, IRON, RETICCTPCT in the last 72 hours.  Coagulation profile No results for input(s): INR, PROTIME in the last 168 hours.  No results for input(s): DDIMER in the last 72 hours.  Cardiac Enzymes  Recent Labs Lab 05/07/14 0350 05/07/14 0955 05/07/14 1530  TROPONINI 0.09* 0.05* 0.04*   ------------------------------------------------------------------------------------------------------------------ Invalid input(s): POCBNP    Emalie Mcwethy D.O. on 05/10/2014 at 11:41 AM  Between 7am to 7pm - Pager - [phone]  After 7pm go to [email or web address] - password [email or web address]  And look for the night coverage person covering for me  after hours  [facility] Office  [phone]

## 2014-05-10 NOTE — Progress Notes (Signed)
ANTIBIOTIC CONSULT NOTE - FOLLOW UP  Pharmacy Consult for Vanc and Zosyn Indication: Cellulitis  Allergies  Allergen Reactions  . Shellfish Allergy Other (See Comments)    Causes gout symptoms to flare up    Patient Measurements: Height: 5\' 5"  (165.1 cm) Weight: 119 lb 14.9 oz (54.4 kg) IBW/kg (Calculated) : 61.5 Adjusted Body Weight:   Vital Signs: Temp: 97.6 F (36.4 C) (01/31 1403) Temp Source: Oral (01/31 1403) BP: 101/57 mmHg (01/31 1403) Pulse Rate: 71 (01/31 0601) Intake/Output from previous day: 01/30 0701 - 01/31 0700 In: 456 [P.O.:406; IV Piggyback:50] Out: 1327  Intake/Output from this shift: Total I/O In: 336 [P.O.:336] Out: 100 [Stool:100]  Labs:  Recent Labs  05/08/14 0622 05/09/14 0559 05/10/14 0405  WBC 18.2* 14.7* 13.0*  HGB 13.6 11.8* 14.2  PLT 164 PLATELET CLUMPS NOTED ON SMEAR, COUNT APPEARS ADEQUATE 180  CREATININE 3.74* 4.82* 3.16*   Estimated Creatinine Clearance: 12 mL/min (by C-G formula based on Cr of 3.16). No results for input(s): VANCOTROUGH, VANCOPEAK, VANCORANDOM, GENTTROUGH, GENTPEAK, GENTRANDOM, TOBRATROUGH, TOBRAPEAK, TOBRARND, AMIKACINPEAK, AMIKACINTROU, AMIKACIN in the last 72 hours.   Microbiology: Recent Results (from the past 720 hour(s))  Culture, blood (routine x 2)     Status: None (Preliminary result)   Collection Time: 05/07/14  3:36 AM  Result Value Ref Range Status   Specimen Description BLOOD LEFT ARM  Final   Special Requests BOTTLES DRAWN AEROBIC ONLY [place]  Final   Culture   Final           BLOOD CULTURE RECEIVED NO GROWTH TO DATE CULTURE WILL BE HELD FOR 5 DAYS BEFORE ISSUING A FINAL NEGATIVE REPORT Performed at [facility]    Report Status PENDING  Incomplete  Culture, blood (routine x 2)     Status: None (Preliminary result)   Collection Time: 05/07/14  3:50 AM  Result Value Ref Range Status   Specimen Description BLOOD RIGHT HAND  Final   Special Requests BOTTLES DRAWN AEROBIC ONLY 4CC  Final    Culture   Final           BLOOD CULTURE RECEIVED NO GROWTH TO DATE CULTURE WILL BE HELD FOR 5 DAYS BEFORE ISSUING A FINAL NEGATIVE REPORT Performed at [facility]    Report Status PENDING  Incomplete    Anti-infectives    Start     Dose/Rate Route Frequency Ordered Stop   05/07/14 1200  vancomycin (VANCOCIN) 500 mg in sodium chloride 0.9 % 100 mL IVPB     500 mg100 mL/hr over 60 Minutes Intravenous Every T-Th-Sa (Hemodialysis) 05/07/14 0209     05/07/14 0600  piperacillin-tazobactam (ZOSYN) IVPB 2.25 g     2.25 g100 mL/hr over 30 Minutes Intravenous 3 times per day 05/07/14 0207     05/07/14 0215  piperacillin-tazobactam (ZOSYN) IVPB 2.25 g     2.25 g100 mL/hr over 30 Minutes Intravenous  Once 05/07/14 0209 05/07/14 0424   05/07/14 0215  vancomycin (VANCOCIN) 1,250 mg in sodium chloride 0.9 % 250 mL IVPB     1,250 mg166.7 mL/hr over 90 Minutes Intravenous  Once 05/07/14 0209 05/07/14 0534      Assessment: 81yo male on antibiotics for cellulitis and skin ulceration.  He is AFeb with improved WBC.  He received and tolerated HD on Saturday.  Blood cx are ntd.  Goal of Therapy:  Pre-HD Vanc level 15-25  Plan:  - Continue Vanc 500mg  QHD-TTS - Continue Zosyn 2.25gm q8h -  Check Vanc level on  Tuesday  Gracy Bruins, PharmD Clinical Pharmacist [facility]

## 2014-05-10 NOTE — Evaluation (Signed)
Physical Therapy Evaluation Patient Details Name: Ryan Arellano MRN: [id number] DOB: March 14, 1922 Today's Date: 05/10/2014   History of Present Illness  81 yo male from ALF with gradual decline over past 2 weeks.  Palliative care consult completed for likely SNF placement.  Clinical Impression  Pt admitted with above diagnosis. Pt currently with functional limitations due to the deficits listed below (see PT Problem List). Pt transferring bed to [facility] and back to bed with mod A. Exhausted by functional activities and lethargic throughout.  Pt will benefit from skilled PT to increase their independence and safety with mobility to allow discharge to the venue listed below.       Follow Up Recommendations SNF;Supervision/Assistance - 24 hour    Equipment Recommendations  None recommended by PT    Recommendations for Other Services       Precautions / Restrictions Precautions Precautions: Fall Restrictions Weight Bearing Restrictions: No      Mobility  Bed Mobility Overal bed mobility: Needs Assistance Bed Mobility: Supine to Sit;Sit to Supine     Supine to sit: Mod assist Sit to supine: Mod assist   General bed mobility comments: pt able to initiate rolling but mod A to elevate trunk. Pt able to scoot to EOB. Mod A for return to supine for legs into bed and pivoting. +2 assist to scoot to [facility]  Transfers Overall transfer level: Needs assistance Equipment used: Rolling walker (2 wheeled) Transfers: Sit to/from [facility] Sit to Stand: Mod assist Stand pivot transfers: Mod assist       General transfer comment: mod A for power up and to maintain standing, pt with fwd flexed posture. RW used for first pivot to [facility]. Pt more fatigued with pivot back to bed due to prolonged standing for bowel clean up so no RW, therapist directly in front of pt with pt's arms on therapist's. Small, shuffling steps.   Ambulation/Gait             General Gait Details: pt  too fatigued after transfers, recommend +2 when advancing ambulation, pt tends to sit quickly  Stairs            Wheelchair Mobility    Modified Rankin (Stroke Patients Only)       Balance Overall balance assessment: Needs assistance Sitting-balance support: Bilateral upper extremity supported;Feet supported Sitting balance-Leahy Scale: Poor Sitting balance - Comments: difficulty maintaining sitting without back support Postural control: Posterior lean Standing balance support: Bilateral upper extremity supported;During functional activity Standing balance-Leahy Scale: Poor Standing balance comment: Pt maintained standing x3 mins 2x for bowel clean up                             Pertinent Vitals/Pain Pain Assessment: No/denies pain  O2 sats 95% on 2L O2    Home Living Family/patient expects to be discharged to:: Skilled nursing facility                 Additional Comments: from ALF    Prior Function Level of Independence: Needs assistance   Gait / Transfers Assistance Needed: pt reports that he spent much time in w/c, primary form of mobility, but ambulated short distances with RW. Uncertain of how accurate info from pt is and no family present to verify  ADL's / Homemaking Assistance Needed: assistance needed recently, unsure of how much, on eval, pt incontinent of bowel        Hand Dominance  Extremity/Trunk Assessment   Upper Extremity Assessment: Generalized weakness           Lower Extremity Assessment: RLE deficits/detail;LLE deficits/detail;Generalized weakness RLE Deficits / Details: hip flex 2/5, knee flex/ ext 2/5 with very tight hamstrings, lacking full active knee ext LLE Deficits / Details: same as RLE  Cervical / Trunk Assessment: Kyphotic  Communication   Communication: No difficulties  Cognition Arousal/Alertness: Lethargic Behavior During Therapy: Flat affect Overall Cognitive Status: History of cognitive  impairments - at baseline       Memory: Decreased short-term memory              General Comments General comments (skin integrity, edema, etc.): pt exhausted after session, asleep before therapist left room. Left on right side with heels propped. Discussed prevalon boots with RN for pt    Exercises        Assessment/Plan    PT Assessment Patient needs continued PT services  PT Diagnosis Difficulty walking;Generalized weakness   PT Problem List Decreased strength;Decreased range of motion;Decreased activity tolerance;Decreased balance;Decreased mobility;Decreased coordination;Decreased cognition;Decreased knowledge of precautions  PT Treatment Interventions DME instruction;Gait training;Functional mobility training;Therapeutic activities;Therapeutic exercise;Balance training;Neuromuscular re-education;Patient/family education;Cognitive remediation   PT Goals (Current goals can be found in the Care Plan section) Acute Rehab PT Goals Patient Stated Goal: none stated PT Goal Formulation: With patient Time For Goal Achievement: 05/24/14 Potential to Achieve Goals: Fair    Frequency Min 2X/week   Barriers to discharge Decreased caregiver support expect pt will need more care than ALF level    Co-evaluation               End of Session Equipment Utilized During Treatment: Gait belt;Oxygen Activity Tolerance: Patient limited by fatigue;Patient limited by lethargy Patient left: in bed;with bed alarm set;with call bell/phone within reach Nurse Communication: Mobility status         Time: 1400-1435 PT Time Calculation (min) (ACUTE ONLY): 35 min   Charges:   PT Evaluation $Initial PT Evaluation Tier I: 1 Procedure PT Treatments $Therapeutic Activity: 8-22 mins   PT G Codes:       Leighton Roach, PT  Acute Rehab Services  [phone]  Leighton Roach 05/10/2014, 2:52 PM

## 2014-05-10 NOTE — Progress Notes (Signed)
  Echocardiogram 2D Echocardiogram has been performed.  Ryan Arellano 05/10/2014, 11:32 AM

## 2014-05-10 NOTE — Progress Notes (Signed)
Pt transferred to 6E01 with all belongings. Family updated at bedside. Meds and chart given to secretary.

## 2014-05-10 NOTE — Progress Notes (Signed)
Subjective:  Having pain "all over" , no specific complaints  Objective Vital signs in last 24 hours: Filed Vitals:   05/09/14 1244 05/09/14 2133 05/10/14 0500 05/10/14 0601  BP: 103/64 92/57  98/58  Pulse: 83 73  71  Temp: 97.6 F (36.4 C) 97.3 F (36.3 C)  97.7 F (36.5 C)  TempSrc: Oral Oral  Oral  Resp: 16 20  17   Height:      Weight:   54.4 kg (119 lb 14.9 oz)   SpO2: 98% 100%  97%   Weight change: -0.8 kg (-1 lb 12.2 oz)  Physical Exam: General: Lethargic/ confused , oriented to person only/ moves all extrem  To request Heart: RRR, soft 1/6 sem lsb, no rub, or gallop Lungs: Decr bs  Bases  Otherwise cta Abdomen: soft , NT, ND Extremities:no pedal edema/ R lower leg bandages not removed Dialysis Access: Pos bruit L ua avf    OP HD: TTS NW 4h 2/2.25 Bath Dry wt=55.5 Heparin 3000 LUA AVF Hectorol 1 ug (will hold as Ca up) Venofer 100/hd thru 2/4  Op pth 45/ ho ca 10/ phos 2.9   Problem/Plan:  1. FTT - palliative care consult to see. If he doesn't improve much we will likely have to move towards comfort care.   2. ESRD - TTS  NW- had HD today tolerated. 3 HTN/volume  - CXR on adm showed small bilateral pleural effusions and pul vasc congestion ; continue to titrate volume down as BP allows - on midodrine ;HD today 1300 uf to wt 54.2 bed wt 4. Anemia - Hgb up  To 11.8 no ESA and holding  Fe - ferritin is 1800 5 Secondary hyperparathyroidism - corr Ca 10.2 - / reck ca ,phos in am / on pholo 2 ac and Hectorol 16mcg/  Dc vit d with pth 45 and decr phoslo 1 ac with ho low phos op 6. Nutrition - npo for swallowing study - Speech th seeing = Dysphagia 3 7. Chronic leukocytosis - was 15 K Dec and Jan and 17 K last November at his outpt unit; cultures done - empiric Vanc and Zosyn - no fevers,  Now 14.7/ wound care RN to eval leg wounds 8. Thrombocythemia - followed by Dr. Julien Nordmann- on agrylin 9. DNR   Kelly Splinter MD pager [phone]    cell [phone] 05/10/2014,  10:53 AM   Labs: Basic Metabolic Panel:  Recent Labs Lab 05/08/14 0622 05/09/14 0559 05/10/14 0405  NA 141 139 135  K 4.8 5.7* 3.9  CL 100 99 97  CO2 26 25 22   GLUCOSE 73 94 123*  BUN 13 20 7   CREATININE 3.74* 4.82* 3.16*  CALCIUM 8.2* 8.8 8.2*   Liver Function Tests:  Recent Labs Lab 05/06/14 2053 05/08/14 0622  AST 40* 34  ALT 15 15  ALKPHOS 121* 115  BILITOT 0.9 1.1  PROT 5.9* 5.7*  ALBUMIN 2.5* 2.3*    Recent Labs Lab 05/06/14 2053  05/07/14 0955 05/08/14 0622 05/09/14 0559 05/10/14 0405  WBC 19.9*  --  15.9* 18.2* 14.7* 13.0*  NEUTROABS 15.7*  --   --   --   --   --   HGB 12.7*  --  12.5* 13.6 11.8* 14.2  HCT 41.8  < > 40.5 43.0 36.6* 44.6  MCV 94.1  --  93.3 94.1 91.3 93.3  PLT 220  --  188 164 PLATELET CLUMPS NOTED ON SMEAR, COUNT APPEARS ADEQUATE 180  < > = values in this interval  not displayed. Cardiac Enzymes:  Recent Labs Lab 05/06/14 2053 05/07/14 0350 05/07/14 0955 05/07/14 1530  TROPONINI 0.06* 0.09* 0.05* 0.04*   CBG:  Recent Labs Lab 05/08/14 1229 05/08/14 1710 05/08/14 2005 05/09/14 0034 05/09/14 0445  GLUCAP 82 80 106* 123* 96    Studies/Results: No results found. Medications:   . anagrelide  2 mg Oral Daily  . antiseptic oral rinse  7 mL Mouth Rinse q12n4p  . aspirin EC  81 mg Oral Daily  . b complex-vitamin c-folic acid  1 tablet Oral QHS  . calcium acetate  667 mg Oral TID WC & HS  . chlorhexidine  15 mL Mouth Rinse BID  . docusate sodium  100 mg Oral Daily  . erythromycin   Both Eyes 4 times per day  . febuxostat  40 mg Oral Daily  . feeding supplement (NEPRO CARB STEADY)  237 mL Oral TID BM  . heparin  5,000 Units Subcutaneous 3 times per day  . magic mouthwash w/lidocaine  15 mL Oral TID  . midodrine  10 mg Oral TID WC  . pantoprazole  40 mg Oral Daily  . piperacillin-tazobactam (ZOSYN)  IV  2.25 g Intravenous 3 times per day  . pregabalin  100 mg Oral QHS  . sodium chloride  3 mL Intravenous Q12H  .  vancomycin  500 mg Intravenous Q T,Th,Sa-HD

## 2014-05-11 LAB — CBC
HEMATOCRIT: 43 % (ref 39.0–52.0)
HEMOGLOBIN: 13.3 g/dL (ref 13.0–17.0)
MCH: 29.8 pg (ref 26.0–34.0)
MCHC: 30.9 g/dL (ref 30.0–36.0)
MCV: 96.2 fL (ref 78.0–100.0)
Platelets: 359 10*3/uL (ref 150–400)
RBC: 4.47 MIL/uL (ref 4.22–5.81)
RDW: 17.7 % — AB (ref 11.5–15.5)
WBC: 24.8 10*3/uL — AB (ref 4.0–10.5)

## 2014-05-11 LAB — BASIC METABOLIC PANEL
Anion gap: 15 (ref 5–15)
BUN: 12 mg/dL (ref 6–23)
CO2: 25 mmol/L (ref 19–32)
Calcium: 8.4 mg/dL (ref 8.4–10.5)
Chloride: 99 mmol/L (ref 96–112)
Creatinine, Ser: 4.14 mg/dL — ABNORMAL HIGH (ref 0.50–1.35)
GFR calc non Af Amer: 11 mL/min — ABNORMAL LOW (ref >90)
GFR, EST AFRICAN AMERICAN: 13 mL/min — AB (ref >90)
Glucose, Bld: 104 mg/dL — ABNORMAL HIGH (ref 70–99)
POTASSIUM: 4 mmol/L (ref 3.5–5.1)
SODIUM: 139 mmol/L (ref 135–145)

## 2014-05-11 MED ORDER — ATROPINE SULFATE 1 % OP SOLN
4.0000 [drp] | OPHTHALMIC | Status: DC | PRN
  Filled 2014-05-11: qty 2

## 2014-05-11 MED ORDER — LORAZEPAM 2 MG/ML IJ SOLN: 1.0000 mg | INTRAMUSCULAR | Status: DC | PRN

## 2014-05-11 MED ORDER — MORPHINE SULFATE 2 MG/ML IJ SOLN
1.0000 mg | INTRAMUSCULAR | Status: DC | PRN
  Administered 2014-05-11 – 2014-05-12 (×3): 2 mg via INTRAVENOUS
  Filled 2014-05-11 (×3): qty 1

## 2014-05-11 MED ORDER — RENA-VITE PO TABS
1.0000 | ORAL_TABLET | Freq: Every day | ORAL | Status: DC
  Filled 2014-05-11: qty 1

## 2014-05-11 MED ORDER — SCOPOLAMINE 1 MG/3DAYS TD PT72
1.0000 | MEDICATED_PATCH | TRANSDERMAL | Status: DC | PRN
  Filled 2014-05-11: qty 1

## 2014-05-11 NOTE — Progress Notes (Addendum)
Progress Note from the Palliative Medicine Team at [facility]  Subjective: Daughters, Butch Penny (and husband) and Magda Paganini, at bedside this morning. They are tearful as they tell me that they have decided to stop dialysis at this point. They were able to discuss as a family over the weekend and now Mr. Hippert is minimally responsive with more decline over the weekend. They understand this decline and are opting to make him comfortable at this point. We discussed comfort and hospice care and may look into hospice facility. Family leaving bedside to meet with Merwyn Katos but will be available by phone. Please call with any changes.     Objective: Allergies  Allergen Reactions  . Shellfish Allergy Other (See Comments)    Causes gout symptoms to flare up   Scheduled Meds: . erythromycin   Both Eyes 4 times per day  . magic mouthwash w/lidocaine  15 mL Oral TID  . sodium chloride  3 mL Intravenous Q12H   Continuous Infusions:  PRN Meds:.[DISCONTINUED] acetaminophen **OR** acetaminophen, atropine, bisacodyl, LORazepam, morphine injection, scopolamine, sodium chloride  BP 102/64 mmHg  Pulse 76  Temp(Src) 98.1 F (36.7 C) (Oral)  Resp 18  Ht 5\' 5"  (1.651 m)  Wt 55.2 kg (121 lb 11.1 oz)  BMI 20.25 kg/m2  SpO2 96%   PPS: 10%   Intake/Output Summary (Last 24 hours) at 05/11/14 0925 Last data filed at 05/10/14 1439  Gross per 24 hour  Intake    100 ml  Output    100 ml  Net      0 ml      LBM: 1/30  Physical Exam:  General: NAD, minimally responsive HEENT: Temporal muscle wasting, carotid artery bounding Chest: No labored breathing, diminished, symmetric CVS: RRR, S1 S2 Abdomen: Soft, NT, distended Ext: No edema, warm to touch, no mottling Neuro: Minimally responsive, moans to tactile stimulation  Labs: CBC    Component Value Date/Time   WBC 24.8* 05/11/2014 0416   WBC 15.2* 06/02/2013 1458   RBC 4.47 05/11/2014 0416   RBC 4.06* 06/02/2013 1458   HGB 13.3 05/11/2014 0416    HGB 11.7* 06/02/2013 1458   HCT 43.0 05/11/2014 0416   HCT 41.0 05/07/2014 0940   HCT 37.1* 06/02/2013 1458   PLT 359 05/11/2014 0416   PLT 281 06/02/2013 1458   MCV 96.2 05/11/2014 0416   MCV 91.2 06/02/2013 1458   MCH 29.8 05/11/2014 0416   MCH 28.9 06/02/2013 1458   MCHC 30.9 05/11/2014 0416   MCHC 31.7* 06/02/2013 1458   RDW 17.7* 05/11/2014 0416   RDW 15.7* 06/02/2013 1458   LYMPHSABS 2.4 05/06/2014 2053   LYMPHSABS 2.5 06/02/2013 1458   MONOABS 1.8* 05/06/2014 2053   MONOABS 1.3* 06/02/2013 1458   EOSABS 0.0 05/06/2014 2053   EOSABS 0.1 06/02/2013 1458   BASOSABS 0.0 05/06/2014 2053   BASOSABS 0.2* 06/02/2013 1458    BMET    Component Value Date/Time   NA 139 05/11/2014 0416   NA 141 06/02/2013 1458   K 4.0 05/11/2014 0416   K 4.5 06/02/2013 1458   CL 99 05/11/2014 0416   CL 106 07/22/2012 1246   CO2 25 05/11/2014 0416   CO2 21* 06/02/2013 1458   GLUCOSE 104* 05/11/2014 0416   GLUCOSE 89 06/02/2013 1458   GLUCOSE 142* 07/22/2012 1246   BUN 12 05/11/2014 0416   BUN 93.6* 06/02/2013 1458   CREATININE 4.14* 05/11/2014 0416   CREATININE 6.5* 06/02/2013 1458   CALCIUM 8.4 05/11/2014 0416  CALCIUM 9.9 06/02/2013 1458   CALCIUM 9.0 02/15/2012 1258   GFRNONAA 11* 05/11/2014 0416   GFRAA 13* 05/11/2014 0416    CMP     Component Value Date/Time   NA 139 05/11/2014 0416   NA 141 06/02/2013 1458   K 4.0 05/11/2014 0416   K 4.5 06/02/2013 1458   CL 99 05/11/2014 0416   CL 106 07/22/2012 1246   CO2 25 05/11/2014 0416   CO2 21* 06/02/2013 1458   GLUCOSE 104* 05/11/2014 0416   GLUCOSE 89 06/02/2013 1458   GLUCOSE 142* 07/22/2012 1246   BUN 12 05/11/2014 0416   BUN 93.6* 06/02/2013 1458   CREATININE 4.14* 05/11/2014 0416   CREATININE 6.5* 06/02/2013 1458   CALCIUM 8.4 05/11/2014 0416   CALCIUM 9.9 06/02/2013 1458   CALCIUM 9.0 02/15/2012 1258   PROT 5.7* 05/08/2014 0622   PROT 7.9 06/02/2013 1458   ALBUMIN 2.3* 05/08/2014 0622   ALBUMIN 3.9 06/02/2013  1458   AST 34 05/08/2014 0622   AST 18 06/02/2013 1458   ALT 15 05/08/2014 0622   ALT 17 06/02/2013 1458   ALKPHOS 115 05/08/2014 0622   ALKPHOS 117 06/02/2013 1458   BILITOT 1.1 05/08/2014 0622   BILITOT 0.48 06/02/2013 1458   GFRNONAA 11* 05/11/2014 0416   GFRAA 13* 05/11/2014 0416    Assessment and Plan: 1. Code Status: DNR 2. Symptom Control: 1. Dyspnea/pain: Morphine 1-2 mg every hour prn.  2. Anxiety/restlessness: Lorazepam 1-2 mg every 4 hours prn.  3. Secretions: Scopolamine and atropine SL prn.  4. Saline lock IV to prevent pulmonary edema.  5. Weakness/decreased appetite: Comfort measures in place.  3. Psycho/Social: Emotional support provided to family at bedside during difficult decisions.  4. Spiritual: Dante stopped by for support but family had left. Informed her that they would be back later and she left her card and number at bedside.  5. Disposition: Hospice facility vs hospital death.     Time In Time Out Total Time Spent with Patient Total Overall Time  0850 0920 50min 52min    Greater than 50%  of this time was spent counseling and coordinating care related to the above assessment and plan.  Vinie Sill, NP Palliative Medicine Team Pager # [phone] (M-F 8a-5p) Team Phone # [phone] (Nights/Weekends)

## 2014-05-11 NOTE — Progress Notes (Signed)
[facility] Progress Note  Assessment/Plan: 1. FTT - palliative care consulted; the family wishes to stop dialysis and pursue comfort care which I fully support; we will sign off. 2. ESRD - last HD 1/30 9. DNR  Myriam Jacobson, PA-C [place] [phone] 05/11/2014,8:15 AM  LOS: 5 days    Renal Attending: Comfort measures in place.  We will sign off. Finneus Kaneshiro C   Subjective:   Family comfortable with decision to stop care  Objective Filed Vitals:   05/10/14 0601 05/10/14 1403 05/10/14 2054 05/11/14 0526  BP: 98/58 101/57 82/47 102/64  Pulse: 71  79 76  Temp: 97.7 F (36.5 C) 97.6 F (36.4 C) 97.8 F (36.6 C) 98.1 F (36.7 C)  TempSrc: Oral Oral Oral Oral  Resp: 17 18 16 18   Height:      Weight:   55.2 kg (121 lb 11.1 oz)   SpO2: 97% 100% 100% 96%   Physical Exam General: weak, NAD Heart:  RRR Lungs: dim BS Abdomen: soft Extremities: no edema Dialysis Access: left upper AVF  Dialysis Orders: TTS NW 4h160 2/2.25 Bath 55.5 - gets to 56-56.5 min vol Heparin 3000 LUA AVF Hectorol 1 ug  Venofer 100/hd thru 2/4 Hgb was 12.6 last with tsat 22% and ferritin 1800 so would not give IV Fe  Additional Objective Labs: Basic Metabolic Panel:  Recent Labs Lab 05/09/14 0559 05/10/14 0405 05/11/14 0416  NA 139 135 139  K 5.7* 3.9 4.0  CL 99 97 99  CO2 25 22 25   GLUCOSE 94 123* 104*  BUN 20 7 12   CREATININE 4.82* 3.16* 4.14*  CALCIUM 8.8 8.2* 8.4   Liver Function Tests:  Recent Labs Lab 05/06/14 2053 05/08/14 0622  AST 40* 34  ALT 15 15  ALKPHOS 121* 115  BILITOT 0.9 1.1  PROT 5.9* 5.7*  ALBUMIN 2.5* 2.3*   CBC:  Recent Labs Lab 05/06/14 2053  05/07/14 0955 05/08/14 0622 05/09/14 0559 05/10/14 0405 05/11/14 0416  WBC 19.9*  --  15.9* 18.2* 14.7* 13.0* 24.8*  NEUTROABS 15.7*  --   --   --   --   --   --   HGB 12.7*  --  12.5* 13.6 11.8* 14.2 13.3  HCT 41.8  < > 40.5 43.0 36.6* 44.6 43.0  MCV  94.1  --  93.3 94.1 91.3 93.3 96.2  PLT 220  --  188 164 PLATELET CLUMPS NOTED ON SMEAR, COUNT APPEARS ADEQUATE 180 359  < > = values in this interval not displayed. Blood Culture    Component Value Date/Time   SDES BLOOD RIGHT HAND 05/07/2014 0350   SPECREQUEST BOTTLES DRAWN AEROBIC ONLY 4CC 05/07/2014 0350   CULT  05/07/2014 0350           BLOOD CULTURE RECEIVED NO GROWTH TO DATE CULTURE WILL BE HELD FOR 5 DAYS BEFORE ISSUING A FINAL NEGATIVE REPORT Performed at [facility] PENDING 05/07/2014 0350    Cardiac Enzymes:  Recent Labs Lab 05/06/14 2053 05/07/14 0350 05/07/14 0955 05/07/14 1530  TROPONINI 0.06* 0.09* 0.05* 0.04*   CBG:  Recent Labs Lab 05/08/14 1229 05/08/14 1710 05/08/14 2005 05/09/14 0034 05/09/14 0445  GLUCAP 82 80 106* 123* 96   Iron Studies: No results for input(s): IRON, TIBC, TRANSFERRIN, FERRITIN in the last 72 hours. @lablastinr3 @ Studies/Results: No results found. Medications:   . anagrelide  2 mg Oral Daily  . antiseptic oral rinse  7 mL Mouth Rinse q12n4p  .  aspirin EC  81 mg Oral Daily  . b complex-vitamin c-folic acid  1 tablet Oral QHS  . calcium acetate  667 mg Oral TID WC & HS  . chlorhexidine  15 mL Mouth Rinse BID  . docusate sodium  100 mg Oral Daily  . erythromycin   Both Eyes 4 times per day  . febuxostat  40 mg Oral Daily  . feeding supplement (NEPRO CARB STEADY)  237 mL Oral TID BM  . heparin  5,000 Units Subcutaneous 3 times per day  . magic mouthwash w/lidocaine  15 mL Oral TID  . midodrine  10 mg Oral TID WC  . pantoprazole  40 mg Oral Daily  . piperacillin-tazobactam (ZOSYN)  IV  2.25 g Intravenous 3 times per day  . pregabalin  100 mg Oral QHS  . sodium chloride  3 mL Intravenous Q12H  . vancomycin  500 mg Intravenous Q T,Th,Sa-HD

## 2014-05-11 NOTE — Clinical Social Work Note (Signed)
CSW received consult from MD regarding residential hospice facility for patient. CSW has talked with one of the daughters Magda Paganini Rosic [phone]) by phone and offered choice and [facility] selected. CSW Erling Conte contacted and informed.   Emmabelle Fear Givens, MSW, LCSW Licensed Clinical Social Worker [place] [phone]

## 2014-05-11 NOTE — Consult Note (Signed)
WOC consulted, reviewed chart. Pt with LE ulcerations that are partial thickness based on nursing documentation.  Will add foam dressings for comfort.  Pt has transitioned to comfort care with end of life nearing.  WOC case conference with bedside nursing on wound since transition to comfort care and no need to disrupt current dressings.   Discussed POC with patient and bedside nurse.  Re consult if needed, will not follow at this time. Thanks  Alyxander Kollmann [place], [place] [phone])

## 2014-05-11 NOTE — Progress Notes (Signed)
[facility]                                                                              Patient Demographics  Ryan Arellano, is a 81 y.o. male, DOB - Oct 30, 1921, AJO:[id number]  Admit date - 05/06/2014   Admitting Physician Jani Gravel, MD  Outpatient Primary MD for the patient is Garret Reddish, MD  LOS - 5   Chief Complaint  Patient presents with  . Fatigue      HPI on 05/07/2014 by Dr. Jani Gravel 81 yo male with Afib, ESRD on HD (T, T, S), Anemia apparently has FTT, Has not been eating well over the past 2 weeks, and gradually getting weaker. Per family denies fever, chills, cp, palp, sob, abd pain, diarrhea, brbpr, black stool. Slight cough, Dry. Pt has been more lethargic over the past 48 hours, and this prompted his family to bring him in. Pt will be admitted for FTT.  Assessment & Plan   Altered mental status/Failure to thrive -CT head: No acute intracranial pathology -Patient appears to be at baseline -TSH 5.58, B12 1656, Folate >620, ANA neg, ESR 30  -Nutrition and speech consulted, patient started on dysphagia 3 diet  Skin ulcerations/Cellulitis/postherpetic neuralgia -Initially placed on vancomycin and Zosyn -Wound care consulted -Patient recently seen by Dr. Yong Channel 04/13/2014 in the office for postherpetic neuralgia and cellulitis- was treated with doxycycline  Leukocytosis, chronic  -Possible secondary to above; however patient has had elevated WBC since Feb 2015 -Blood cultures show no growth to date -CXR: Cardiac enlargement with pulmonary vascular congestion, bilateral small pleural effusions -Patient afebrile -Cannot obtain UA due to ESRD  Thrombocythemia -Was followed by Dr. Julien Nordmann -Was on Agrylin  Elevated troponin -Peak trop 0.09, now 0.04 -Unlikely accurate marker of ACS in ESRD patient -Echocardiogram showed an EF 94-70%, grade 3 diastolic dysfunction  Mildly Elevated LFTs -Resovled, Possibly secondary to FTT  ESRD -Dialyzes on  Tuesday, Thursday, Saturday -Nephrology consult appreciated  Small bilateral pleural effusions -Seen on CXR -Should resolve or improve with HD  Conjunctivitis -Continue erythromycin ophthalmic ointment  Hyperkalemia -Resolved, will continue to monitor  Deconditioning -PT consulted and recommend SNF  CODE STATUS/Goals of care -Spoke with daughters via phone, CODE STATUS has been changed to DO NOT RESUSCITATE. -Palliative care consulted and appreciated -After speaking with the patient's family, will discontinue dialysis and make patient comfort care -Patient placed on morphine and ativan PRN -Will ask Social work for possible hospice placement  Code Status: DO NOT RESUSCITATE   Family Communication: Daughters at bedside  Disposition Plan: Admitted   Time Spent in minutes   30  minutes  Procedures  Echocardiogram  Consults   Nephrology Palliative care   DVT Prophylaxis  None, comfort care  Lab Results  Component Value Date   PLT 359 05/11/2014    Medications  Scheduled Meds: . erythromycin   Both Eyes 4 times per day  . magic mouthwash w/lidocaine  15 mL Oral TID  . sodium chloride  3 mL Intravenous Q12H   Continuous Infusions:  PRN Meds:.[DISCONTINUED] acetaminophen **OR** acetaminophen, atropine, bisacodyl, LORazepam, morphine injection, scopolamine, sodium chloride  Antibiotics    Anti-infectives    Start  Dose/Rate Route Frequency Ordered Stop   05/07/14 1200  vancomycin (VANCOCIN) 500 mg in sodium chloride 0.9 % 100 mL IVPB  Status:  Discontinued     500 mg100 mL/hr over 60 Minutes Intravenous Every T-Th-Sa (Hemodialysis) 05/07/14 0209 05/11/14 0859   05/07/14 0600  piperacillin-tazobactam (ZOSYN) IVPB 2.25 g  Status:  Discontinued     2.25 g100 mL/hr over 30 Minutes Intravenous 3 times per day 05/07/14 0207 05/11/14 0859   05/07/14 0215  piperacillin-tazobactam (ZOSYN) IVPB 2.25 g     2.25 g100 mL/hr over 30 Minutes Intravenous  Once 05/07/14 0209  05/07/14 0424   05/07/14 0215  vancomycin (VANCOCIN) 1,250 mg in sodium chloride 0.9 % 250 mL IVPB     1,250 mg166.7 mL/hr over 90 Minutes Intravenous  Once 05/07/14 0209 05/07/14 0534        Subjective:   Juanpablo Ciresi seen and examined today.  Patient not communicative this morning.  Daughters at bedside.  Objective:   Filed Vitals:   05/10/14 0601 05/10/14 1403 05/10/14 2054 05/11/14 0526  BP: 98/58 101/57 82/47 102/64  Pulse: 71  79 76  Temp: 97.7 F (36.5 C) 97.6 F (36.4 C) 97.8 F (36.6 C) 98.1 F (36.7 C)  TempSrc: Oral Oral Oral Oral  Resp: 17 18 16 18   Height:      Weight:   55.2 kg (121 lb 11.1 oz)   SpO2: 97% 100% 100% 96%    Wt Readings from Last 3 Encounters:  05/10/14 55.2 kg (121 lb 11.1 oz)  04/15/14 58.06 kg (128 lb)  03/27/14 58.06 kg (128 lb)     Intake/Output Summary (Last 24 hours) at 05/11/14 0931 Last data filed at 05/10/14 1439  Gross per 24 hour  Intake    100 ml  Output    100 ml  Net      0 ml    Exam  General: Well developed, NAD, appears stated age  31: NCAT, mucous membranes moist.   Cardiovascular: S1 S2 auscultated   Respiratory: Diminished breath sounds at the bases  Abdomen: Soft, nontender, nondistended, + bowel sounds  Extremities: warm dry without cyanosis clubbing or edema  Data Review   Micro Results Recent Results (from the past 240 hour(s))  Culture, blood (routine x 2)     Status: None (Preliminary result)   Collection Time: 05/07/14  3:36 AM  Result Value Ref Range Status   Specimen Description BLOOD LEFT ARM  Final   Special Requests BOTTLES DRAWN AEROBIC ONLY [facility]  Final   Culture   Final           BLOOD CULTURE RECEIVED NO GROWTH TO DATE CULTURE WILL BE HELD FOR 5 DAYS BEFORE ISSUING A FINAL NEGATIVE REPORT Performed at [facility]    Report Status PENDING  Incomplete  Culture, blood (routine x 2)     Status: None (Preliminary result)   Collection Time: 05/07/14  3:50 AM  Result Value  Ref Range Status   Specimen Description BLOOD RIGHT HAND  Final   Special Requests BOTTLES DRAWN AEROBIC ONLY 4CC  Final   Culture   Final           BLOOD CULTURE RECEIVED NO GROWTH TO DATE CULTURE WILL BE HELD FOR 5 DAYS BEFORE ISSUING A FINAL NEGATIVE REPORT Performed at [facility]    Report Status PENDING  Incomplete    Radiology Reports Dg Chest 2 View  05/06/2014   CLINICAL DATA:  Altered mental status. Mid  chest pain. History of pulmonary hypertension, atrial fibrillation, thrombocytopenia, hypertension, renal insufficiency.  EXAM: CHEST  2 VIEW  COMPARISON:  03/18/2010  FINDINGS: Shallow inspiration. Cardiac enlargement with diffuse pulmonary vascular congestion. Hazy perihilar infiltrates consistent with mild interstitial edema. Small bilateral pleural effusions with basilar atelectasis, greater on the left. Findings have progressed since previous study. Probable underlying emphysematous change. No pneumothorax. Calcification of the aorta. Mild thoracic kyphosis with degenerative changes in the thoracic spine. Compression of an upper lumbar vertebra, probably L1. This is new since the previous study of 2011 but appears chronic.  IMPRESSION: Cardiac enlargement with pulmonary vascular congestion and perihilar edema. Small bilateral pleural effusions with basilar atelectasis, greater on the left.   Electronically Signed   By: Lucienne Capers M.D.   On: 05/06/2014 21:48   Ct Head Wo Contrast  05/06/2014   CLINICAL DATA:  Worsening lethargy, with decreased appetite. Initial encounter.  EXAM: CT HEAD WITHOUT CONTRAST  TECHNIQUE: Contiguous axial images were obtained from the base of the skull through the vertex without intravenous contrast.  COMPARISON:  None.  FINDINGS: There is no evidence of acute infarction, mass lesion, or intra- or extra-axial hemorrhage on CT.  Prominence of the ventricles and sulci reflects mild to moderate cortical volume loss. Cerebellar atrophy is noted. Mild  periventricular white matter change likely reflects small vessel ischemic microangiopathy.  The brainstem and fourth ventricle are within normal limits. The basal ganglia are unremarkable in appearance. The cerebral hemispheres demonstrate grossly normal gray-white differentiation. No mass effect or midline shift is seen.  There is no evidence of fracture; visualized osseous structures are unremarkable in appearance. The visualized portions of the orbits are within normal limits. The paranasal sinuses and mastoid air cells are well-aerated. No significant soft tissue abnormalities are seen.  IMPRESSION: 1. No acute intracranial pathology seen on CT. 2. Mild to moderate cortical volume loss and scattered small vessel ischemic microangiopathy.   Electronically Signed   By: Garald Balding M.D.   On: 05/06/2014 22:12    CBC  Recent Labs Lab 05/06/14 2053  05/07/14 0955 05/08/14 0622 05/09/14 0559 05/10/14 0405 05/11/14 0416  WBC 19.9*  --  15.9* 18.2* 14.7* 13.0* 24.8*  HGB 12.7*  --  12.5* 13.6 11.8* 14.2 13.3  HCT 41.8  < > 40.5 43.0 36.6* 44.6 43.0  PLT 220  --  188 164 PLATELET CLUMPS NOTED ON SMEAR, COUNT APPEARS ADEQUATE 180 359  MCV 94.1  --  93.3 94.1 91.3 93.3 96.2  MCH 28.6  --  28.8 29.8 29.4 29.7 29.8  MCHC 30.4  --  30.9 31.6 32.2 31.8 30.9  RDW 17.5*  --  17.4* 17.8* 18.1* 17.7* 17.7*  LYMPHSABS 2.4  --   --   --   --   --   --   MONOABS 1.8*  --   --   --   --   --   --   EOSABS 0.0  --   --   --   --   --   --   BASOSABS 0.0  --   --   --   --   --   --   < > = values in this interval not displayed.  Chemistries   Recent Labs Lab 05/06/14 2053 05/08/14 0622 05/09/14 0559 05/10/14 0405 05/11/14 0416  NA 136 141 139 135 139  K 3.6 4.8 5.7* 3.9 4.0  CL 92* 100 99 97 99  CO2 33* 26 25 22  25  GLUCOSE 142* 73 94 123* 104*  BUN 16 13 20 7 12   CREATININE 4.25* 3.74* 4.82* 3.16* 4.14*  CALCIUM 8.7 8.2* 8.8 8.2* 8.4  AST 40* 34  --   --   --   ALT 15 15  --   --   --     ALKPHOS 121* 115  --   --   --   BILITOT 0.9 1.1  --   --   --    ------------------------------------------------------------------------------------------------------------------ estimated creatinine clearance is 9.3 mL/min (by C-G formula based on Cr of 4.14). ------------------------------------------------------------------------------------------------------------------ No results for input(s): HGBA1C in the last 72 hours. ------------------------------------------------------------------------------------------------------------------ No results for input(s): CHOL, HDL, LDLCALC, TRIG, CHOLHDL, LDLDIRECT in the last 72 hours. ------------------------------------------------------------------------------------------------------------------ No results for input(s): TSH, T4TOTAL, T3FREE, THYROIDAB in the last 72 hours.  Invalid input(s): FREET3 ------------------------------------------------------------------------------------------------------------------ No results for input(s): VITAMINB12, FOLATE, FERRITIN, TIBC, IRON, RETICCTPCT in the last 72 hours.  Coagulation profile No results for input(s): INR, PROTIME in the last 168 hours.  No results for input(s): DDIMER in the last 72 hours.  Cardiac Enzymes  Recent Labs Lab 05/07/14 0350 05/07/14 0955 05/07/14 1530  TROPONINI 0.09* 0.05* 0.04*   ------------------------------------------------------------------------------------------------------------------ Invalid input(s): POCBNP    Daylyn Christine D.O. on 05/11/2014 at 9:31 AM  Between 7am to 7pm - Pager - [phone]  After 7pm go to [email or web address] - password [email or web address]  And look for the night coverage person covering for me after hours  [facility] Office  [phone]

## 2014-05-11 NOTE — Progress Notes (Signed)
OT Cancellation Note and Discharge  Patient Details Name: Ryan Arellano MRN: [id number] DOB: 1922-03-28   Cancelled Treatment:    Reason Eval/Treat Not Completed: Other (comment). Pt now comfort care with hospice. We will sign off.  Almon Register [phone] 05/11/2014, 10:15 AM

## 2014-05-12 MED ORDER — ATROPINE SULFATE 1 % OP SOLN: 4.0000 [drp] | OPHTHALMIC | Status: AC | PRN

## 2014-05-12 MED ORDER — MORPHINE SULFATE (CONCENTRATE) 10 MG /0.5 ML PO SOLN: 10.0000 mg | ORAL | Status: AC | PRN

## 2014-05-12 MED ORDER — LORAZEPAM 1 MG PO TABS: 1.0000 mg | ORAL_TABLET | Freq: Three times a day (TID) | ORAL | Status: AC

## 2014-05-12 MED ORDER — SCOPOLAMINE 1 MG/3DAYS TD PT72: 1.0000 | MEDICATED_PATCH | TRANSDERMAL | Status: AC | PRN

## 2014-05-12 MED ORDER — MAGIC MOUTHWASH W/LIDOCAINE: 15.0000 mL | Freq: Three times a day (TID) | ORAL | Status: AC

## 2014-05-12 MED ORDER — ERYTHROMYCIN 5 MG/GM OP OINT: TOPICAL_OINTMENT | Freq: Four times a day (QID) | OPHTHALMIC | Status: AC

## 2014-05-12 MED ORDER — RENA-VITE PO TABS: 1.0000 | ORAL_TABLET | Freq: Every day | ORAL | Status: AC

## 2014-05-12 MED ORDER — BISACODYL 10 MG RE SUPP: 10.0000 mg | Freq: Every day | RECTAL | Status: AC | PRN

## 2014-05-12 NOTE — Consult Note (Signed)
[facility] Liaison: [employer] available for Mr. Siefring today. Daughters completed paper work yesterday afternoon. Dr. Orpah Melter to assume care per family request. Please fax discharge summary to [phone]. RN please call report to [phone]. Please arrange transport for patient to arrive between 10 and noon. Thank you. Erling Conte LCSW [phone]

## 2014-05-12 NOTE — Care Management Note (Signed)
CARE MANAGEMENT NOTE 05/12/2014  Patient:  JEWELZ, KOBUS   Account Number:  [id number]  Date Initiated:  05/08/2014  Documentation initiated by:  Tomi Bamberger  Subjective/Objective Assessment:   dx ftt  admit- from home     Action/Plan:   05/12/2014 Pt for d/c to [facility] for resident hospice care.   Anticipated DC Date:  05/12/2014   Anticipated DC Plan:  [place]  In-house referral  Clinical Social Worker      DC Planning Services  CM consult      Choice offered to / List presented to:             Status of service:  In process, will continue to follow Medicare Important Message given?  YES (If response is "NO", the following Medicare IM given date fields will be blank) Date Medicare IM given:  05/08/2014 Medicare IM given by:  Tomi Bamberger Date Additional Medicare IM given:  05/11/2014 Additional Medicare IM given by:  [facility]  Discharge Disposition:  [place]  Per UR Regulation:    If discussed at Long Length of Stay Meetings, dates discussed:    Comments:  05/08/14 [place] RN,BSN [phone] patient from home NCM will cont to follow for dc needs.

## 2014-05-12 NOTE — Care Management Note (Signed)
Per MD, Mr. Ryan Arellano is ready for discharge to [facility] today. Ambulance will be called for transportation. Family will be contacted once transportation is arranged.   Gwenevere Abbot CSW-Intern

## 2014-05-12 NOTE — Progress Notes (Signed)
Patient Discharge:  Disposition: Pt discharged to [facility]: Report given to charge nurse at [facility] place  IV: R Forearm intact  Transportation: Via Ambulance  Belongings: All belongings taken with pt

## 2014-05-12 NOTE — Discharge Summary (Signed)
Physician Discharge Summary  Ryan Arellano EXN:[id number] DOB: 1922/02/28 DOA: 05/06/2014  PCP: Garret Reddish, MD  Admit date: 05/06/2014 Discharge date: 05/12/2014  Time spent: 45 minutes  Recommendations for Outpatient Follow-up:  Patient will be discharged to [facility].  Continue dysphagia 3 diet.  Continue medications as prescribed.  Discharge Diagnoses:  Active Problems:   Atrial fibrillation   End stage renal disease   FTT (failure to thrive) in adult   Leukocytosis   Altered mental status   Palliative care encounter   Weakness generalized   Decreased appetite   Discharge Condition: Stable  Diet recommendation: Dysphagia 3 diet  Filed Weights   05/09/14 1157 05/10/14 0500 05/10/14 2054  Weight: 54.2 kg (119 lb 7.8 oz) 54.4 kg (119 lb 14.9 oz) 55.2 kg (121 lb 11.1 oz)    History of present illness:  on 05/07/2014 by Dr. Jani Gravel 81 yo male with Afib, ESRD on HD (T, T, S), Anemia apparently has FTT, Has not been eating well over the past 2 weeks, and gradually getting weaker. Per family denies fever, chills, cp, palp, sob, abd pain, diarrhea, brbpr, black stool. Slight cough, Dry. Pt has been more lethargic over the past 48 hours, and this prompted his family to bring him in. Pt will be admitted for FTT.  Hospital Course:  Altered mental status/Failure to thrive -CT head: No acute intracranial pathology -Patient appears to be at baseline -TSH 5.58, B12 1656, Folate >620, ANA neg, ESR 30  -Nutrition and speech consulted, patient started on dysphagia 3 diet  Skin ulcerations/Cellulitis/postherpetic neuralgia -Initially placed on vancomycin and Zosyn -Wound care consulted -Patient recently seen by Dr. Yong Channel 04/13/2014 in the office for postherpetic neuralgia and cellulitis- was treated with doxycycline  Leukocytosis, chronic  -Possible secondary to above; however patient has had elevated WBC since Feb 2015 -Blood cultures show no growth to  date -CXR: Cardiac enlargement with pulmonary vascular congestion, bilateral small pleural effusions -Patient afebrile -Cannot obtain UA due to ESRD  Thrombocythemia -Was followed by Dr. Julien Nordmann -Was on Agrylin  Elevated troponin -Peak trop 0.09, now 0.04 -Unlikely accurate marker of ACS in ESRD patient -Echocardiogram showed an EF 49-67%, grade 3 diastolic dysfunction  Mildly Elevated LFTs -Resovled, Possibly secondary to FTT  ESRD -Dialyzes on Tuesday, Thursday, Saturday -Nephrology consult appreciated  Small bilateral pleural effusions -Seen on CXR -Should resolve or improve with HD  Conjunctivitis -Continue erythromycin ophthalmic ointment  Hyperkalemia -Resolved, will continue to monitor  Deconditioning -PT consulted and recommend SNF  CODE STATUS/Goals of care -Spoke with daughters via phone, CODE STATUS has been changed to DO NOT RESUSCITATE. -Palliative care consulted and appreciated -After speaking with the patient's family, will discontinue dialysis and make patient comfort care -Patient placed on morphine and ativan PRN and scopolamine  -patient will be discharged to [facility], [place].    Procedures  Echocardiogram  Consults  Nephrology Palliative care   Discharge Exam: Filed Vitals:   05/12/14 0526  BP: 104/68  Pulse: 93  Temp: 98.2 F (36.8 C)  Resp: 20   Exam  General: Well developed, NAD, appears stated age  HEENT: NCAT, mucous membranes moist.   Cardiovascular: S1 S2 auscultated  Respiratory: Diminished breath sounds at the bases  Abdomen: Soft, nontender, nondistended, + bowel sounds  Extremities: warm dry without cyanosis clubbing or edema  Discharge Instructions      Discharge Instructions    Discharge instructions    Complete by:  As directed   Patient will be  discharged to [facility].  Continue dysphagia 3 diet.  Continue medications as prescribed.            Medication List    STOP taking  these medications        anagrelide 0.5 MG capsule  Commonly known as:  AGRYLIN     aspirin 81 MG tablet     benzonatate 100 MG capsule  Commonly known as:  TESSALON     calcium acetate 667 MG capsule  Commonly known as:  PHOSLO     colchicine 0.6 MG tablet     DIALYVITE PO  Replaced by:  multivitamin Tabs tablet     diclofenac sodium 1 % Gel  Commonly known as:  VOLTAREN     docusate sodium 100 MG capsule  Commonly known as:  COLACE     lidocaine 5 %  Commonly known as:  LIDODERM     midodrine 10 MG tablet  Commonly known as:  PROAMATINE     neomycin-bacitracin-polymyxin ointment  Commonly known as:  NEOSPORIN     omeprazole 40 MG capsule  Commonly known as:  PRILOSEC     oxyCODONE 5 MG immediate release tablet  Commonly known as:  Oxy IR/ROXICODONE     pregabalin 50 MG capsule  Commonly known as:  LYRICA     SENOKOT PO     ULORIC 40 MG tablet  Generic drug:  febuxostat      TAKE these medications        acetaminophen 500 MG tablet  Commonly known as:  TYLENOL  Take 500 mg by mouth 2 (two) times daily as needed for moderate pain (for pain between Lyrica doses).     atropine 1 % ophthalmic solution  Place 4 drops under the tongue every 4 (four) hours as needed (secretions).     bisacodyl 10 MG suppository  Commonly known as:  DULCOLAX  Place 1 suppository (10 mg total) rectally daily as needed for severe constipation.     erythromycin ophthalmic ointment  Place into both eyes every 6 (six) hours.     LORazepam 1 MG tablet  Commonly known as:  ATIVAN  Take 1 tablet (1 mg total) by mouth every 8 (eight) hours.     magic mouthwash w/lidocaine Soln  Take 15 mLs by mouth 3 (three) times daily.     morphine CONCENTRATE 10 mg / 0.5 ml concentrated solution  Take 0.5 mLs (10 mg total) by mouth every 2 (two) hours as needed for severe pain.     multivitamin Tabs tablet  Take 1 tablet by mouth at bedtime.     scopolamine 1 MG/3DAYS  Commonly known  as:  TRANSDERM-SCOP  Place 1 patch (1.5 mg total) onto the skin every 3 (three) days as needed (terminal secretions).     sodium chloride 0.65 % Soln nasal spray  Commonly known as:  OCEAN  Place 1-2 sprays into both nostrils as needed for congestion.       Allergies  Allergen Reactions  . Shellfish Allergy Other (See Comments)    Causes gout symptoms to flare up      The results of significant diagnostics from this hospitalization (including imaging, microbiology, ancillary and laboratory) are listed below for reference.    Significant Diagnostic Studies: Dg Chest 2 View  05/06/2014   CLINICAL DATA:  Altered mental status. Mid chest pain. History of pulmonary hypertension, atrial fibrillation, thrombocytopenia, hypertension, renal insufficiency.  EXAM: CHEST  2 VIEW  COMPARISON:  03/18/2010  FINDINGS: Shallow inspiration. Cardiac enlargement with diffuse pulmonary vascular congestion. Hazy perihilar infiltrates consistent with mild interstitial edema. Small bilateral pleural effusions with basilar atelectasis, greater on the left. Findings have progressed since previous study. Probable underlying emphysematous change. No pneumothorax. Calcification of the aorta. Mild thoracic kyphosis with degenerative changes in the thoracic spine. Compression of an upper lumbar vertebra, probably L1. This is new since the previous study of 2011 but appears chronic.  IMPRESSION: Cardiac enlargement with pulmonary vascular congestion and perihilar edema. Small bilateral pleural effusions with basilar atelectasis, greater on the left.   Electronically Signed   By: Lucienne Capers M.D.   On: 05/06/2014 21:48   Ct Head Wo Contrast  05/06/2014   CLINICAL DATA:  Worsening lethargy, with decreased appetite. Initial encounter.  EXAM: CT HEAD WITHOUT CONTRAST  TECHNIQUE: Contiguous axial images were obtained from the base of the skull through the vertex without intravenous contrast.  COMPARISON:  None.  FINDINGS:  There is no evidence of acute infarction, mass lesion, or intra- or extra-axial hemorrhage on CT.  Prominence of the ventricles and sulci reflects mild to moderate cortical volume loss. Cerebellar atrophy is noted. Mild periventricular white matter change likely reflects small vessel ischemic microangiopathy.  The brainstem and fourth ventricle are within normal limits. The basal ganglia are unremarkable in appearance. The cerebral hemispheres demonstrate grossly normal gray-white differentiation. No mass effect or midline shift is seen.  There is no evidence of fracture; visualized osseous structures are unremarkable in appearance. The visualized portions of the orbits are within normal limits. The paranasal sinuses and mastoid air cells are well-aerated. No significant soft tissue abnormalities are seen.  IMPRESSION: 1. No acute intracranial pathology seen on CT. 2. Mild to moderate cortical volume loss and scattered small vessel ischemic microangiopathy.   Electronically Signed   By: Garald Balding M.D.   On: 05/06/2014 22:12    Microbiology: Recent Results (from the past 240 hour(s))  Culture, blood (routine x 2)     Status: None (Preliminary result)   Collection Time: 05/07/14  3:36 AM  Result Value Ref Range Status   Specimen Description BLOOD LEFT ARM  Final   Special Requests BOTTLES DRAWN AEROBIC ONLY [place]  Final   Culture   Final           BLOOD CULTURE RECEIVED NO GROWTH TO DATE CULTURE WILL BE HELD FOR 5 DAYS BEFORE ISSUING A FINAL NEGATIVE REPORT Performed at [facility]    Report Status PENDING  Incomplete  Culture, blood (routine x 2)     Status: None (Preliminary result)   Collection Time: 05/07/14  3:50 AM  Result Value Ref Range Status   Specimen Description BLOOD RIGHT HAND  Final   Special Requests BOTTLES DRAWN AEROBIC ONLY 4CC  Final   Culture   Final           BLOOD CULTURE RECEIVED NO GROWTH TO DATE CULTURE WILL BE HELD FOR 5 DAYS BEFORE ISSUING A FINAL NEGATIVE  REPORT Performed at [facility]    Report Status PENDING  Incomplete     Labs: Basic Metabolic Panel:  Recent Labs Lab 05/06/14 2053 05/08/14 0622 05/09/14 0559 05/10/14 0405 05/11/14 0416  NA 136 141 139 135 139  K 3.6 4.8 5.7* 3.9 4.0  CL 92* 100 99 97 99  CO2 33* _0 GLUCOSE 142* 73 94 123* 104*  BUN _1 CREATININE 4.25* 3.74* 4.82* 3.16* 4.14*  CALCIUM 8.7 8.2* 8.8 8.2* 8.4   Liver Function Tests:  Recent Labs Lab 05/06/14 2053 05/08/14 0622  AST 40* 34  ALT 15 15  ALKPHOS 121* 115  BILITOT 0.9 1.1  PROT 5.9* 5.7*  ALBUMIN 2.5* 2.3*   No results for input(s): LIPASE, AMYLASE in the last 168 hours. No results for input(s): AMMONIA in the last 168 hours. CBC:  Recent Labs Lab 05/06/14 2053  05/07/14 0955 05/08/14 0622 05/09/14 0559 05/10/14 0405 05/11/14 0416  WBC 19.9*  --  15.9* 18.2* 14.7* 13.0* 24.8*  NEUTROABS 15.7*  --   --   --   --   --   --   HGB 12.7*  --  12.5* 13.6 11.8* 14.2 13.3  HCT 41.8  < > 40.5 43.0 36.6* 44.6 43.0  MCV 94.1  --  93.3 94.1 91.3 93.3 96.2  PLT 220  --  188 164 PLATELET CLUMPS NOTED ON SMEAR, COUNT APPEARS ADEQUATE 180 359  < > = values in this interval not displayed. Cardiac Enzymes:  Recent Labs Lab 05/06/14 2053 05/07/14 0350 05/07/14 0955 05/07/14 1530  TROPONINI 0.06* 0.09* 0.05* 0.04*   BNP: BNP (last 3 results)  Recent Labs  05/06/14 2053  BNP 897.7*    ProBNP (last 3 results) No results for input(s): PROBNP in the last 8760 hours.  CBG:  Recent Labs Lab 05/08/14 1229 05/08/14 1710 05/08/14 2005 05/09/14 0034 05/09/14 0445  GLUCAP 82 80 106* 123* 96       Signed:  ,   [facility] 05/12/2014, 9:37 AM

## 2014-05-13 LAB — CULTURE, BLOOD (ROUTINE X 2)
Culture: NO GROWTH
Culture: NO GROWTH

## 2014-05-22 ENCOUNTER — Telehealth: Payer: Self-pay | Admitting: *Deleted

## 2014-05-22 NOTE — Telephone Encounter (Signed)
Inform md and nurse of patient death

## 2014-06-09 DEATH — deceased

## 2014-06-29 ENCOUNTER — Ambulatory Visit: Payer: Self-pay | Admitting: Family Medicine

## 2014-07-15 ENCOUNTER — Ambulatory Visit: Payer: Medicare Other | Admitting: Internal Medicine

## 2014-07-29 ENCOUNTER — Ambulatory Visit: Payer: Medicare Other | Admitting: Internal Medicine

## 2015-12-24 IMAGING — CT CT HEAD W/O CM
3 series · 17 of 30 positions shown, 19 images · non-contrast
Comparison: None.

CLINICAL DATA: Worsening lethargy, with decreased appetite. Initial
encounter.

EXAM:
CT HEAD WITHOUT CONTRAST
TECHNIQUE: Contiguous axial images were obtained from the base of the skull
through the vertex without intravenous contrast.

[Series 2: head 5.0 h30s · axial · 0.41mm/px · z∈[-158,-53]mm · 6 of 31 slices shown, 8 images (1 of 2)]
[im 5/31  brain]
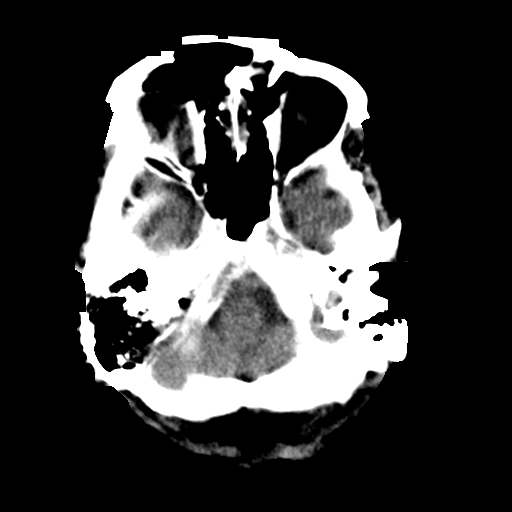
[im 5/31  bone]
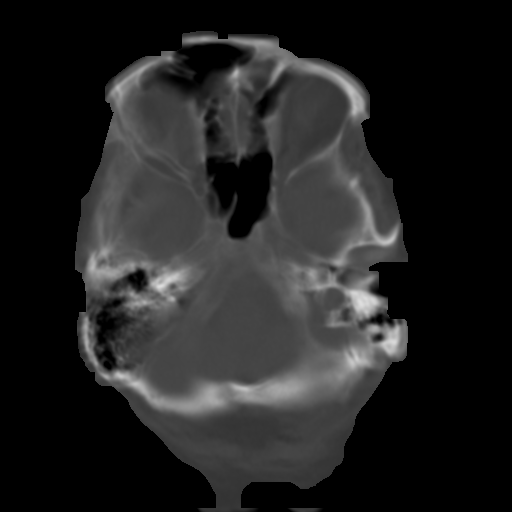
[im 9/31  brain]
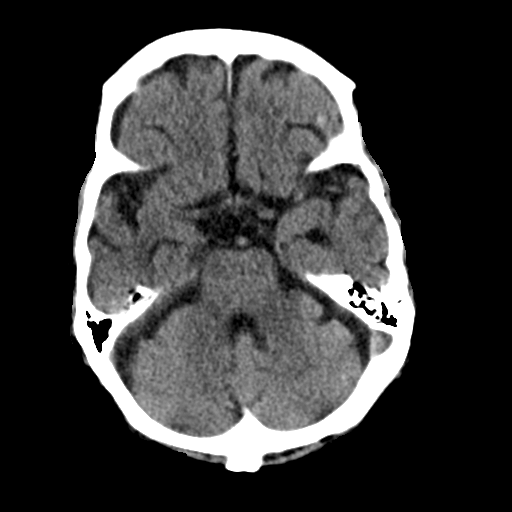
[im 13/31  brain]
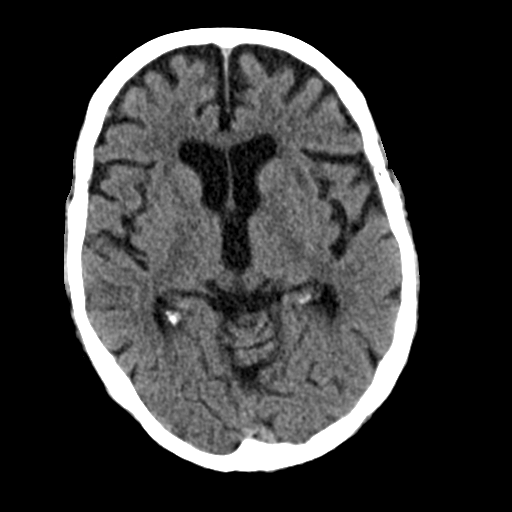
[im 18/31  brain]
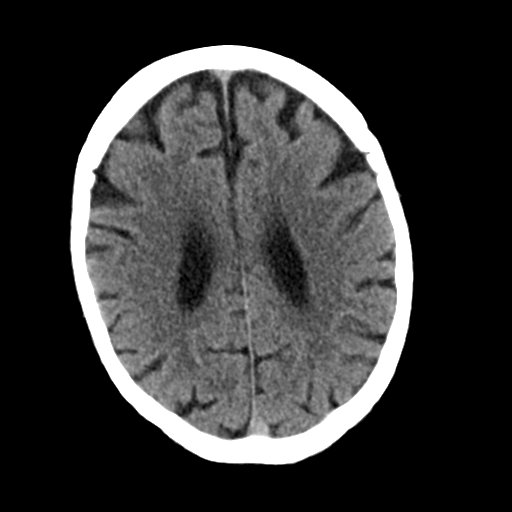
[im 22/31  brain]
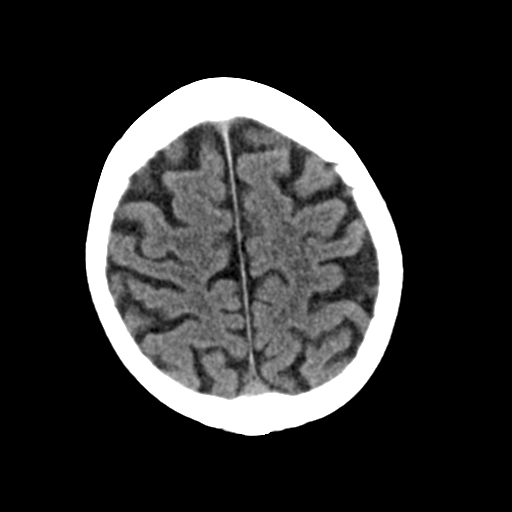
[im 22/31  bone]
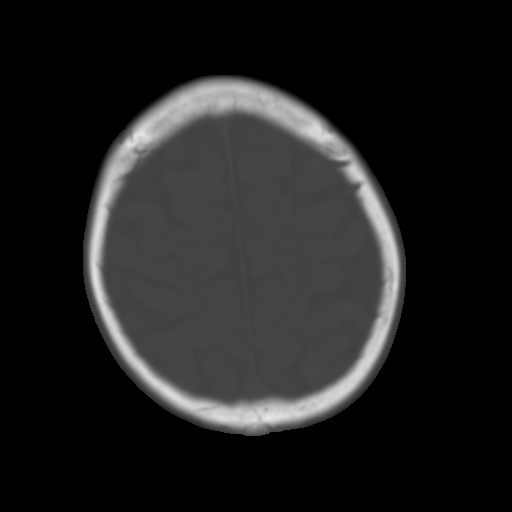
[im 26/31  brain]
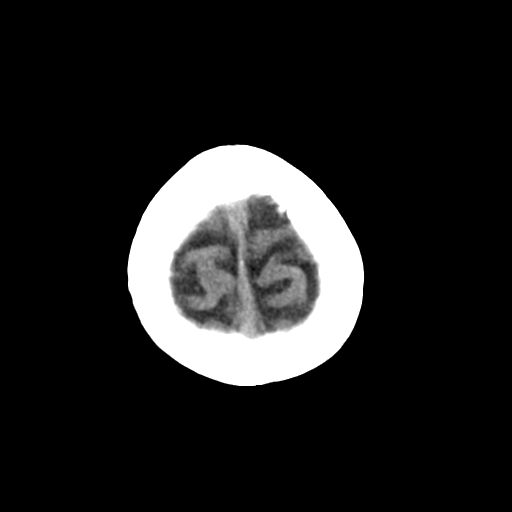

[Series 4: head 5.0 h30s · axial · 0.41mm/px · z∈[-158,-113]mm · 3 of 23 slices shown (2 of 2)]
[im 5/23  brain]
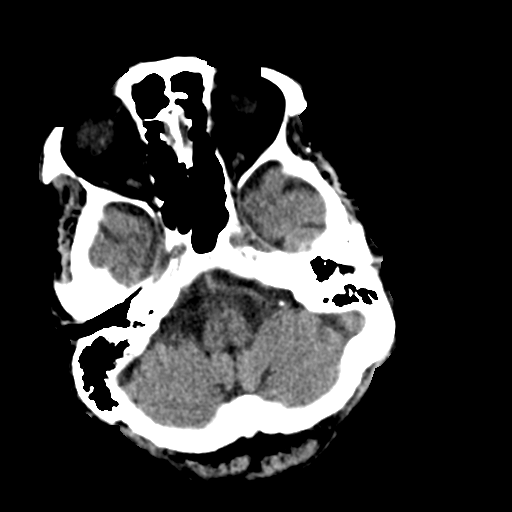
[im 9/23  brain]
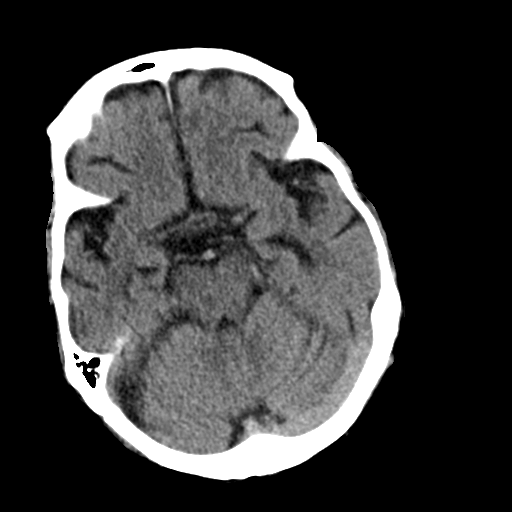
[im 14/23  brain]
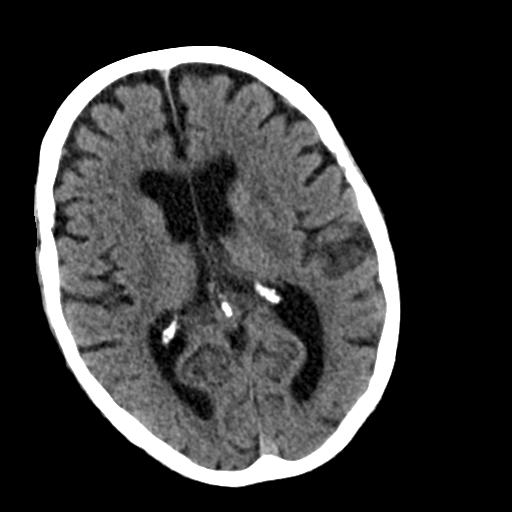

[Series 5: head 2.0 h70h · axial · 0.41mm/px · z∈[-172,-72]mm · 8 of 58 slices shown]
[im 4/58  brain]
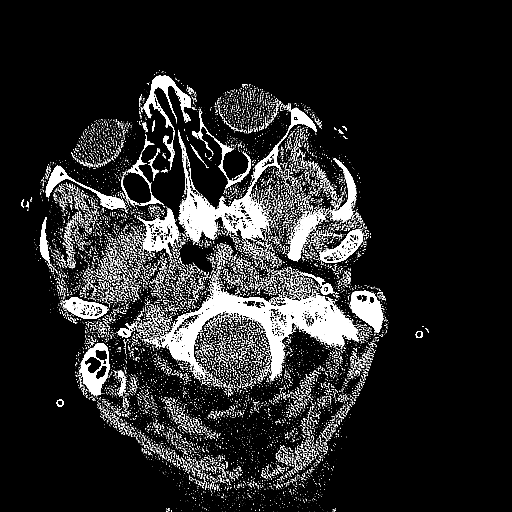
[im 12/58  brain]
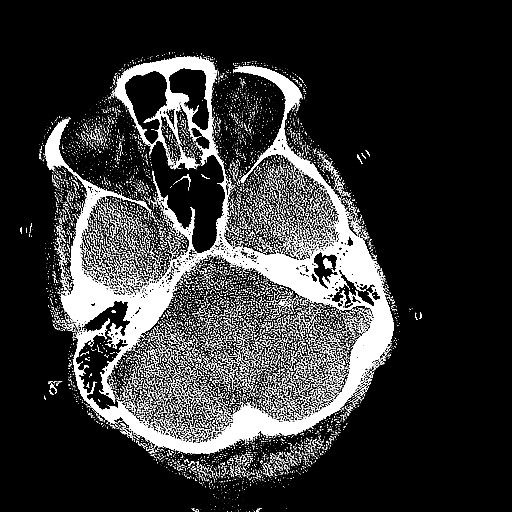
[im 20/58  brain]
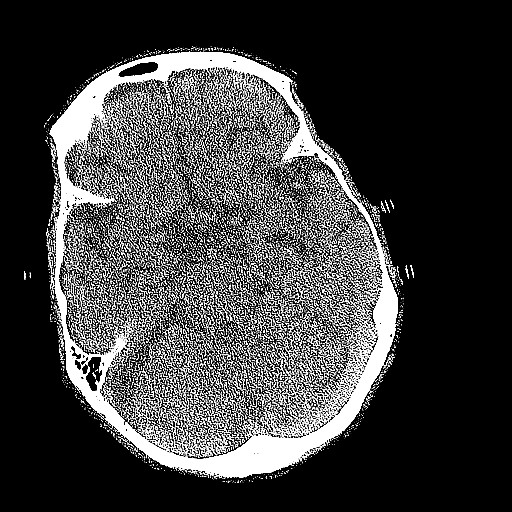
[im 27/58  brain]
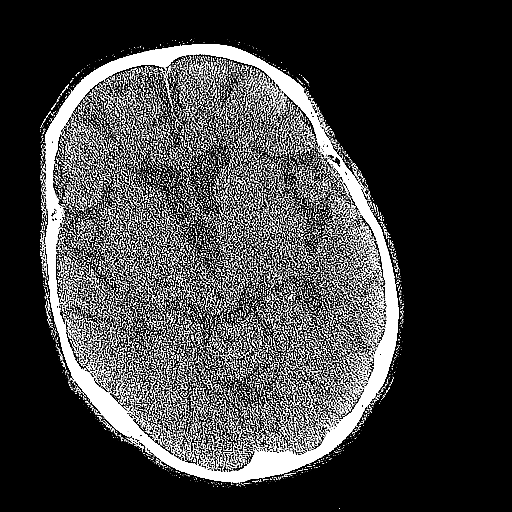
[im 31/58  brain]
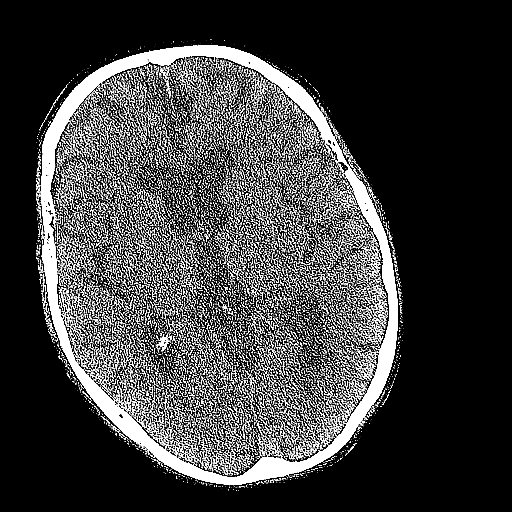
[im 39/58  brain]
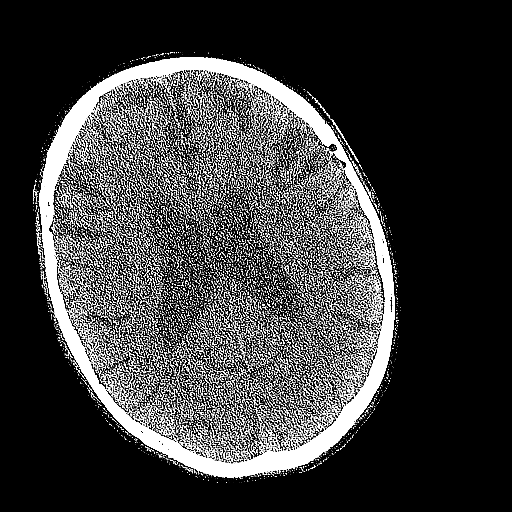
[im 46/58  brain]
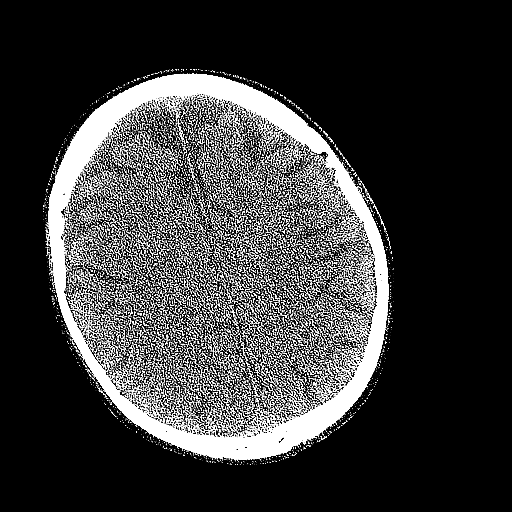
[im 54/58  brain]
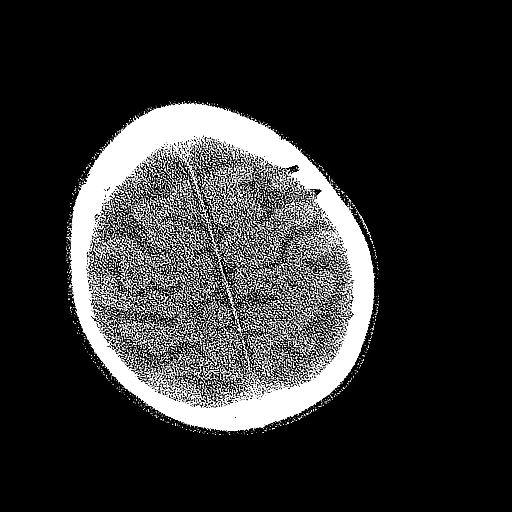

[17 of 30 positions shown; findings below may reference images not displayed]

FINDINGS: There is no evidence of acute infarction, mass lesion, or intra- or
extra-axial hemorrhage on CT.

Prominence of the ventricles and sulci reflects mild to moderate
cortical volume loss. Cerebellar atrophy is noted. Mild
periventricular white matter change likely reflects small vessel
ischemic microangiopathy.

The brainstem and fourth ventricle are within normal limits. The
basal ganglia are unremarkable in appearance. The cerebral
hemispheres demonstrate grossly normal gray-white differentiation.
No mass effect or midline shift is seen.

There is no evidence of fracture; visualized osseous structures are
unremarkable in appearance. The visualized portions of the orbits
are within normal limits. The paranasal sinuses and mastoid air
cells are well-aerated. No significant soft tissue abnormalities are
seen.
IMPRESSION: 1. No acute intracranial pathology seen on CT.
2. Mild to moderate cortical volume loss and scattered small vessel
ischemic microangiopathy.
